# Patient Record
Sex: Male | Born: 1935 | Race: Black or African American | Hispanic: No | Marital: Married | Smoking: Former smoker
Health system: Southern US, Community
[De-identification: ages and names within clinical notes are randomized; demographics above are authoritative.]

## PROBLEM LIST (undated history)

## (undated) DIAGNOSIS — F0390 Unspecified dementia without behavioral disturbance: Secondary | ICD-10-CM

## (undated) DIAGNOSIS — E7800 Pure hypercholesterolemia, unspecified: Secondary | ICD-10-CM

## (undated) DIAGNOSIS — N529 Male erectile dysfunction, unspecified: Secondary | ICD-10-CM

## (undated) DIAGNOSIS — I10 Essential (primary) hypertension: Secondary | ICD-10-CM

## (undated) DIAGNOSIS — N400 Enlarged prostate without lower urinary tract symptoms: Secondary | ICD-10-CM

## (undated) DIAGNOSIS — Z923 Personal history of irradiation: Secondary | ICD-10-CM

## (undated) DIAGNOSIS — C61 Malignant neoplasm of prostate: Secondary | ICD-10-CM

## (undated) DIAGNOSIS — F03A0 Unspecified dementia, mild, without behavioral disturbance, psychotic disturbance, mood disturbance, and anxiety: Secondary | ICD-10-CM

## (undated) HISTORY — DX: Unspecified dementia, mild, without behavioral disturbance, psychotic disturbance, mood disturbance, and anxiety: F03.A0

## (undated) HISTORY — DX: Male erectile dysfunction, unspecified: N52.9

## (undated) HISTORY — DX: Personal history of irradiation: Z92.3

## (undated) HISTORY — DX: Pure hypercholesterolemia, unspecified: E78.00

## (undated) HISTORY — PX: FRACTURE SURGERY: SHX138

## (undated) HISTORY — PX: FEMUR FRACTURE SURGERY: SHX633

## (undated) HISTORY — DX: Unspecified dementia without behavioral disturbance: F03.90

## (undated) HISTORY — DX: Essential (primary) hypertension: I10

## (undated) HISTORY — DX: Benign prostatic hyperplasia without lower urinary tract symptoms: N40.0

---

## 2003-01-01 ENCOUNTER — Encounter: Admission: RE | Admit: 2003-01-01 | Discharge: 2003-01-01 | Payer: Self-pay | Admitting: Family Medicine

## 2003-01-01 ENCOUNTER — Encounter: Payer: Self-pay | Admitting: Family Medicine

## 2003-01-21 ENCOUNTER — Ambulatory Visit (HOSPITAL_COMMUNITY): Admission: RE | Admit: 2003-01-21 | Discharge: 2003-01-21 | Payer: Self-pay | Admitting: Orthopedic Surgery

## 2003-01-21 ENCOUNTER — Encounter: Payer: Self-pay | Admitting: Orthopedic Surgery

## 2005-05-23 ENCOUNTER — Inpatient Hospital Stay (HOSPITAL_COMMUNITY): Admission: EM | Admit: 2005-05-23 | Discharge: 2005-05-26 | Payer: Self-pay | Admitting: Emergency Medicine

## 2008-12-22 ENCOUNTER — Encounter: Admission: RE | Admit: 2008-12-22 | Discharge: 2008-12-22 | Payer: Self-pay | Admitting: Orthopedic Surgery

## 2009-02-07 ENCOUNTER — Encounter: Admission: RE | Admit: 2009-02-07 | Discharge: 2009-02-07 | Payer: Self-pay | Admitting: Orthopedic Surgery

## 2009-03-29 ENCOUNTER — Ambulatory Visit (HOSPITAL_COMMUNITY): Admission: RE | Admit: 2009-03-29 | Discharge: 2009-03-30 | Payer: Self-pay | Admitting: Orthopedic Surgery

## 2009-04-09 HISTORY — PX: ROTATOR CUFF REPAIR: SHX139

## 2009-05-02 ENCOUNTER — Encounter: Admission: RE | Admit: 2009-05-02 | Discharge: 2009-07-27 | Payer: Self-pay | Admitting: Orthopedic Surgery

## 2009-10-07 ENCOUNTER — Encounter: Admission: RE | Admit: 2009-10-07 | Discharge: 2009-10-07 | Payer: Self-pay | Admitting: Orthopedic Surgery

## 2009-10-12 ENCOUNTER — Encounter: Admission: RE | Admit: 2009-10-12 | Discharge: 2010-01-02 | Payer: Self-pay | Admitting: Orthopedic Surgery

## 2010-04-30 ENCOUNTER — Encounter: Payer: Self-pay | Admitting: Gastroenterology

## 2010-04-30 ENCOUNTER — Encounter: Payer: Self-pay | Admitting: Orthopedic Surgery

## 2010-07-03 ENCOUNTER — Emergency Department (HOSPITAL_COMMUNITY)
Admission: EM | Admit: 2010-07-03 | Discharge: 2010-07-03 | Disposition: A | Discharge status: Home / Self Care | Payer: MEDICARE | Attending: Emergency Medicine | Admitting: Emergency Medicine

## 2010-07-03 DIAGNOSIS — I10 Essential (primary) hypertension: Secondary | ICD-10-CM | POA: Insufficient documentation

## 2010-07-03 DIAGNOSIS — R112 Nausea with vomiting, unspecified: Secondary | ICD-10-CM | POA: Insufficient documentation

## 2010-07-03 DIAGNOSIS — R197 Diarrhea, unspecified: Secondary | ICD-10-CM | POA: Insufficient documentation

## 2010-07-03 DIAGNOSIS — E119 Type 2 diabetes mellitus without complications: Secondary | ICD-10-CM | POA: Insufficient documentation

## 2010-07-03 LAB — POCT I-STAT, CHEM 8
BUN: 20 mg/dL (ref 6–23)
Calcium, Ion: 1.02 mmol/L — ABNORMAL LOW (ref 1.12–1.32)
Chloride: 107 mEq/L (ref 96–112)
Glucose, Bld: 274 mg/dL — ABNORMAL HIGH (ref 70–99)
TCO2: 21 mmol/L (ref 0–100)

## 2010-07-10 LAB — URINALYSIS, ROUTINE W REFLEX MICROSCOPIC
Bilirubin Urine: NEGATIVE
Glucose, UA: NEGATIVE mg/dL
Hgb urine dipstick: NEGATIVE
Ketones, ur: NEGATIVE mg/dL
Protein, ur: NEGATIVE mg/dL
pH: 6.5 (ref 5.0–8.0)

## 2010-07-10 LAB — CBC
HCT: 36.3 % — ABNORMAL LOW (ref 39.0–52.0)
Hemoglobin: 12.3 g/dL — ABNORMAL LOW (ref 13.0–17.0)
MCHC: 33.8 g/dL (ref 30.0–36.0)
MCV: 91.7 fL (ref 78.0–100.0)
Platelets: 233 10*3/uL (ref 150–400)
RDW: 14.6 % (ref 11.5–15.5)

## 2010-07-10 LAB — GLUCOSE, CAPILLARY
Glucose-Capillary: 130 mg/dL — ABNORMAL HIGH (ref 70–99)
Glucose-Capillary: 163 mg/dL — ABNORMAL HIGH (ref 70–99)

## 2010-07-10 LAB — COMPREHENSIVE METABOLIC PANEL
Albumin: 4.1 g/dL (ref 3.5–5.2)
BUN: 10 mg/dL (ref 6–23)
Calcium: 9 mg/dL (ref 8.4–10.5)
Chloride: 104 mEq/L (ref 96–112)
Creatinine, Ser: 0.98 mg/dL (ref 0.4–1.5)
Total Bilirubin: 0.8 mg/dL (ref 0.3–1.2)
Total Protein: 7.7 g/dL (ref 6.0–8.3)

## 2010-07-29 ENCOUNTER — Emergency Department (HOSPITAL_COMMUNITY)
Admission: EM | Admit: 2010-07-29 | Discharge: 2010-07-29 | Disposition: A | Discharge status: Home / Self Care | Payer: Medicare Other | Attending: Emergency Medicine | Admitting: Emergency Medicine

## 2010-07-29 ENCOUNTER — Emergency Department (HOSPITAL_COMMUNITY): Payer: Medicare Other

## 2010-07-29 DIAGNOSIS — IMO0002 Reserved for concepts with insufficient information to code with codable children: Secondary | ICD-10-CM | POA: Insufficient documentation

## 2010-07-29 DIAGNOSIS — Z87828 Personal history of other (healed) physical injury and trauma: Secondary | ICD-10-CM | POA: Insufficient documentation

## 2010-07-29 DIAGNOSIS — I10 Essential (primary) hypertension: Secondary | ICD-10-CM | POA: Insufficient documentation

## 2010-07-29 DIAGNOSIS — S61409A Unspecified open wound of unspecified hand, initial encounter: Secondary | ICD-10-CM | POA: Insufficient documentation

## 2010-07-29 DIAGNOSIS — E119 Type 2 diabetes mellitus without complications: Secondary | ICD-10-CM | POA: Insufficient documentation

## 2010-07-29 DIAGNOSIS — M21939 Unspecified acquired deformity of unspecified forearm: Secondary | ICD-10-CM | POA: Insufficient documentation

## 2010-07-29 DIAGNOSIS — Y92009 Unspecified place in unspecified non-institutional (private) residence as the place of occurrence of the external cause: Secondary | ICD-10-CM | POA: Insufficient documentation

## 2010-07-29 DIAGNOSIS — W11XXXA Fall on and from ladder, initial encounter: Secondary | ICD-10-CM | POA: Insufficient documentation

## 2010-08-05 ENCOUNTER — Emergency Department (HOSPITAL_COMMUNITY)
Admission: EM | Admit: 2010-08-05 | Discharge: 2010-08-05 | Disposition: A | Discharge status: Home / Self Care | Payer: Medicare Other | Attending: Emergency Medicine | Admitting: Emergency Medicine

## 2010-08-05 DIAGNOSIS — E119 Type 2 diabetes mellitus without complications: Secondary | ICD-10-CM | POA: Insufficient documentation

## 2010-08-05 DIAGNOSIS — S61409A Unspecified open wound of unspecified hand, initial encounter: Secondary | ICD-10-CM | POA: Insufficient documentation

## 2010-08-05 DIAGNOSIS — Z09 Encounter for follow-up examination after completed treatment for conditions other than malignant neoplasm: Secondary | ICD-10-CM | POA: Insufficient documentation

## 2010-08-05 DIAGNOSIS — X58XXXA Exposure to other specified factors, initial encounter: Secondary | ICD-10-CM | POA: Insufficient documentation

## 2010-08-12 ENCOUNTER — Emergency Department (HOSPITAL_COMMUNITY)
Admission: EM | Admit: 2010-08-12 | Discharge: 2010-08-12 | Disposition: A | Discharge status: Home / Self Care | Payer: Medicare Other | Attending: Emergency Medicine | Admitting: Emergency Medicine

## 2010-08-12 DIAGNOSIS — Z4802 Encounter for removal of sutures: Secondary | ICD-10-CM | POA: Insufficient documentation

## 2010-08-25 NOTE — H&P (Signed)
NAMEJOHARI, Craig Henderson NO.:  [id number]   MEDICAL RECORD NO.:  [id number]          PATIENT TYPE:  EMS   LOCATION:  ED                           FACILITY:  [facility]   PHYSICIAN:  Elliot Cousin, M.D.    DATE OF BIRTH:  1929-12-21   DATE OF ADMISSION:  05/22/2005  DATE OF DISCHARGE:                                HISTORY & PHYSICAL   CHIEF COMPLAINT:  Productive cough, shortness of breath, vomiting,  generalized weakness.   HISTORY OF PRESENT ILLNESS:  The patient is a 77 year old man with a past  medical history significant for type 2 diabetes mellitus, hypertension, and  hyperlipidemia, who presents to the emergency department with a three-day  history of generalized weakness, productive cough with shortness of breath,  subjective fever and chills, and poor appetite.  His symptoms started three  to four days ago with generalized nausea and vomiting after being exposed to  a co-worker with a viral gastroenteritis.  The nausea and vomiting have  subsided.  His cough has continued.  It is productive with green sputum.  He  denies chest pain or pleurisy.  He does not smoke.  He has no prior history  of pneumonia.   During the evaluation in the emergency department, the patient is noted to  be febrile with a temperature of 102.9, tachycardic with a pulse rate of 125  and hypoxic with an oxygen saturation on room air of 83%.  His chest x-ray  reveals bibasilar pneumonia.  The patient will, therefore, be admitted for  further evaluation and management.   PAST MEDICAL HISTORY:  1.  Type 2 diabetes mellitus, diagnosed approximately 20 years ago.  The      patient gives no history of diabetic retinopathy, neuropathy, or      nephropathy.  2.  Hypertension.  3.  History of bilateral lower extremity fractures secondary to a fall,      status post surgical repair in 1974.  4.  Hyperlipidemia.   MEDICATIONS:  1.  Lotrel 5/20 mg one tablet daily.  2.  Pravachol 20 mg  daily.  3.  Glucovance question dose b.i.d.  4.  Byetta 10 mg subcutaneously b.i.d.   ALLERGIES:  No known drug allergies.   SOCIAL HISTORY:  The [place] is [place]  He has five children.  He is retired from his  primary occupation.  However, he is now self-employed.  He recycles  computers.  He denies tobacco and illicit drug use.   FAMILY HISTORY:  His mother died of old age at the age of 23 years old. His  father died in his 24's secondary to some type of poisoning.   REVIEW OF SYSTEMS:  The patient's review of systems is positive for  occasional arthritic pain; otherwise his review of systems is negative.   PHYSICAL EXAMINATION:  VITAL SIGNS:  Temperature 102.9, repeated at 100.3.  Pulse 125, repeated at 96.  Oxygen saturation 83% on room air, repeated at  98%  on 4 L.  Respiratory rate 20.  GENERAL:  The patient is  a pleasant, 77 year old [place] man who is  currently lying in bed in no acute distress.  However, he does appear ill.  HEENT:  Head is normocephalic, atraumatic.  Pupils are equal, round and  reactive to light.  Extraocular movements  were intact.  Conjunctivae clear.  Sclerae white.  Tympanic membranes are clear bilaterally.  Nasal mucosa is  dry.  No sinus tenderness.  Oropharynx reveals fairly good dentition.  Mucous membranes are dry.  No posterior exudates or erythema.  NECK:  Supple.  No adenopathy.  No thyromegaly.  No bruits.  No JVD.  LUNGS:  Breathing is nonlabored.  He has bilateral crackles in his lower  lobes.  HEART:  S1 and S2 with mild tachycardia.  No murmurs, rubs or gallops.  ABDOMEN:  Hypoactive bowel sounds.  Abdomen is slightly obese, soft,  nontender, nondistended.  No hepatosplenomegaly.  No masses palpated.  EXTREMITIES:  The patient's pedal pulses are 2+ bilaterally.  No pretibial  edema.  No pedal edema.  No open plantar ulcers.  The patient has an  excellent range of motion  of all of his extremities.  No focal or acute  joint findings.  NEUROLOGIC:  The patient is alert and oriented x3.  Cranial nerves II-XII  intact.  Strength is 5/5 in the supine position.  Sensation is intact.   ADMISSION LABORATORY DATA:  Chest x-ray reveals bilateral lower lobe  pneumonia, right greater than left.  ABG on 4 L of nasal cannula oxygen  revealed a PH of 7.4, PCO2 36, PO2 of 68.  Sodium 137, potassium 3.4,  chloride 106, CO2 24, glucose 244, BUN 16, creatinine 1.3, calcium 8.5,  total protein 7.6, albumin 3.8, AST 21, ALT 22, alk phos 71, total bilirubin  1.  WBC 16.9.  Absolute neutrophil count 16.1.  Hemoglobin 12.1, hematocrit  35.2, platelets 187.   ASSESSMENT:  1.  Bilateral lower lobe pneumonia with hypoxia, fever, and leukocytosis.  2.  Tachycardia, probably secondary to volume depletion and pneumonia.  3.  Hypokalemia.  4.  Normocytic anemia.  5.  Type 2 diabetes mellitus.  6.  Hypertension.   PLAN:  1.  Blood cultures were ordered by the emergency department physician.  The      patient was started on antibiotic therapy with Rocephin and azithromycin      per Dr. Adriana Simas.  2.  The patient will be admitted for further evaluation and management.      Antibiotic therapy will continue with Rocephin and azithromycin      intravenously.  3.  Nasal cannula oxygen titrated to keep his oxygen saturations greater      than 90%.  4.  Symptomatic treatment with Xopenex nebulizers, Humibid LA, and Tussionex      5 mL q.12h. p.r.n. for cough.  5.  Tylenol as needed for fever.  6.  Will check a urine, legionella antigen, sputum for routine Gram stain      and culture and sensitivity.  7.  Will check a TSH, vitamin B12, folate, ferritin, and total iron for work-      up of anemia.  Guaiac stools x3.  8.  Volume repletion with normal saline.  9.  Continue Lotrel for hypertension. 10. Will hold Glucovance until the patient's intake is adequate.  Will also      hold Byetta.   Will treat his diabetes      during the hospitalization with a sliding scale insulin regimen and  Lantus.  Doubt that the patient will need to go home on insulin therapy.      Will check a hemoglobin A1C, however.  11. Replete potassium chloride and check a magnesium level to rule out      deficiency.      Elliot Cousin, M.D.  Electronically Signed     DF/MEDQ  D:  05/23/2005  T:  05/23/2005  Job:  [id number]

## 2010-08-25 NOTE — Discharge Summary (Signed)
Craig Henderson, Craig Henderson NO.:  [id number]   MEDICAL RECORD NO.:  [id number]          PATIENT TYPE:  INP   LOCATION:  1406                         FACILITY:  [facility]   PHYSICIAN:  Craig Henderson, D.O.    DATE OF BIRTH:  Nov 06, 1929   DATE OF ADMISSION:  05/22/2005  DATE OF DISCHARGE:  05/26/2005                                 DISCHARGE SUMMARY   REASON FOR ADMISSION:  Productive cough, worsening shortness of breath,  radiographic evidence of bilateral lower lobe pneumonia with hypoxia.   INITIAL DIAGNOSES:  1.  Type 2 diabetes with evidence of good control in the outpatient setting      on Byetta. His hemoglobin A1c this admission was at 5.3. His blood      glucoses were a little bit less well controlled in the inpatient setting      due to his acute illness. In addition, his regimen of Byetta was not      available in the inpatient setting.  2.  Iron-deficiency anemia with iron saturation less than 10%. The patient      reports he had a colonoscopy about four years ago. Would recommend      further evaluation of this in the outpatient setting by his primary care      physician. He is being discharged on supplemental iron sulfate, and he      was Hemoccult negative during his hospital course.  3.  Hyperlipidemia and hypertension, controlled during this hospitalization.  4.  History of bilateral lower extremity fracture secondary to fall status      post surgical repair.   DISPOSITION:  The patient is being discharged home to follow with his  primary care physician. We have established a followup appointment with Dr.  Renaye Rakers on Monday at 4:45. He is in improved and medically stable  condition.   MEDICATIONS:  1.  Avelox 400 mg daily until June 05, 2005.  2.  Iron sulfate 325 mg p.o. b.i.d.  3.  He should continue his home regimen of metformin of 5/500 b.i.d.  4.  Pravachol 80 mg daily.  5.  1.  Byetta 10 mg twice daily as before.   HISTORY OF PRESENT  ILLNESS:  For full details, please refer to the H&P as  dictated by Dr. Elliot Cousin. Briefly, Craig Henderson is a pleasant 77-year-  old male with a past medical history significant for type 2 diabetes,  hypertension, hyperlipidemia who presents to the emergency department with a  three-day history of generalized weakness, productive cough, shortness of  breath, subjective fevers and poor appetite. His symptoms started three to  four days previously with generalized nausea and vomiting after being  exposed to a coworker with gastroenteritis. He stated that the nausea and  vomiting had subsided though he continued to have a cough with colored  sputum. In the emergency department, he is noted to be febrile, temperature  102.9, tachycardic with a heart rate of 125. He was hypoxic on room with O2  saturation of 83%. He was admitted for treatment of pneumonia.   HOSPITAL COURSE:  The patient  was initially admitted. He underwent blood  cultures that were negative. Legionella urinary antigen was as well  negative. He underwent empiric treatment with IV antibiotics, and his course  was that of slow but gradual improvement. He continued with fevers until 24  hours prior to discharge. His oxygen saturations improved, and he did  clinically look much better and was felt to be stable for discharge. Routine  studies  did reveal iron-deficiency anemia, and further workup is being deferred to  his primary care physician until resolution of his acute illness.   LABORATORY DATA:  On day of discharge, sodium 139, potassium 3.7, BUN 13,  creatinine 1.2, glucose 159.      Craig Henderson, D.O.  Electronically Signed     ESS/MEDQ  D:  05/26/2005  T:  05/26/2005  Job:  [id number]   cc:   Renaye Rakers, M.D.  Fax: [phone]

## 2010-10-05 ENCOUNTER — Encounter: Payer: Self-pay | Admitting: Podiatry

## 2010-10-09 ENCOUNTER — Ambulatory Visit
Admission: RE | Admit: 2010-10-09 | Discharge: 2010-10-09 | Disposition: A | Discharge status: Home / Self Care | Payer: Medicare Other | Source: Ambulatory Visit | Attending: Orthopedic Surgery | Admitting: Orthopedic Surgery

## 2010-10-09 ENCOUNTER — Other Ambulatory Visit: Payer: Self-pay | Admitting: Orthopedic Surgery

## 2010-10-09 DIAGNOSIS — M1990 Unspecified osteoarthritis, unspecified site: Secondary | ICD-10-CM

## 2011-07-18 DIAGNOSIS — C61 Malignant neoplasm of prostate: Secondary | ICD-10-CM

## 2011-07-18 HISTORY — DX: Malignant neoplasm of prostate: C61

## 2011-07-18 HISTORY — PX: PROSTATE BIOPSY: SHX241

## 2011-07-27 ENCOUNTER — Other Ambulatory Visit (HOSPITAL_COMMUNITY): Payer: Self-pay | Admitting: Urology

## 2011-07-27 DIAGNOSIS — C61 Malignant neoplasm of prostate: Secondary | ICD-10-CM

## 2011-08-01 ENCOUNTER — Encounter (HOSPITAL_COMMUNITY)
Admission: RE | Admit: 2011-08-01 | Discharge: 2011-08-01 | Disposition: A | Discharge status: Home / Self Care | Payer: Medicare Other | Source: Ambulatory Visit | Attending: Urology | Admitting: Urology

## 2011-08-01 ENCOUNTER — Encounter (HOSPITAL_COMMUNITY): Payer: Self-pay

## 2011-08-01 DIAGNOSIS — C61 Malignant neoplasm of prostate: Secondary | ICD-10-CM

## 2011-08-01 HISTORY — DX: Malignant neoplasm of prostate: C61

## 2011-08-01 MED ORDER — TECHNETIUM TC 99M MEDRONATE IV KIT
25.0000 | PACK | Freq: Once | INTRAVENOUS | Status: AC | PRN
  Administered 2011-08-01: 25 via INTRAVENOUS

## 2011-09-04 ENCOUNTER — Encounter: Payer: Self-pay | Admitting: *Deleted

## 2011-09-05 ENCOUNTER — Ambulatory Visit
Admission: RE | Admit: 2011-09-05 | Discharge: 2011-09-05 | Disposition: A | Discharge status: Home / Self Care | Payer: Medicare Other | Source: Ambulatory Visit | Attending: Radiation Oncology | Admitting: Radiation Oncology

## 2011-09-05 ENCOUNTER — Encounter: Payer: Self-pay | Admitting: Radiation Oncology

## 2011-09-05 VITALS — BP 131/69 | HR 71 | Temp 98.6°F | Wt 188.6 lb

## 2011-09-05 DIAGNOSIS — C61 Malignant neoplasm of prostate: Secondary | ICD-10-CM | POA: Insufficient documentation

## 2011-09-05 DIAGNOSIS — Z79899 Other long term (current) drug therapy: Secondary | ICD-10-CM | POA: Insufficient documentation

## 2011-09-05 DIAGNOSIS — E7800 Pure hypercholesterolemia, unspecified: Secondary | ICD-10-CM | POA: Insufficient documentation

## 2011-09-05 DIAGNOSIS — Z510 Encounter for antineoplastic radiation therapy: Secondary | ICD-10-CM | POA: Insufficient documentation

## 2011-09-05 DIAGNOSIS — E119 Type 2 diabetes mellitus without complications: Secondary | ICD-10-CM | POA: Insufficient documentation

## 2011-09-05 DIAGNOSIS — I10 Essential (primary) hypertension: Secondary | ICD-10-CM | POA: Insufficient documentation

## 2011-09-05 NOTE — Progress Notes (Signed)
Radiation Oncology         [phone] ________________________________  Initial outpatient Consultation  Name: Craig Henderson MRN: [id number]  Date: 09/05/2011  DOB: 1929-12-10  WU:JWJXB,JYNWG J, MD, MD  Lindaann Slough, MD   REFERRING PHYSICIAN: Lindaann Slough, MD  DIAGNOSIS: 78 year old gentleman with stage T1c adenocarcinoma prostate Gleason score 4+5 and a PSA of 12.45  HISTORY OF PRESENT ILLNESS::Craig Henderson is a 78 year old gentleman. He was noted to have an elevated PSA of 12.3 by his primary care physician, Dr. Parke Simmers in January 2013 .  Accordingly, he was referred for evaluation in urology by Dr. [name] on 05/22/2011,  digital rectal examination was performed at that time revealing a 3+ prostate with no nodules. The patient received Cipro for 3 weeks with repeat PSA on March 14. His PSA at that time remained elevated at 12.45.Marland Kitchen  The patient proceeded to transrectal ultrasound with 12 biopsies of the prostate on 07/18/2011.  The prostate volume measured 156.35  cc.  Out of 12 core biopsies,10  were positive.  The maximum Gleason score was 4+5, and this was seen in to core biopsies from the right base and right lateral base zones. Gleason's 4+4 was seen in the right mid, and right apex specimens. Other cores contained Gleason 6 and 7 disease..  The patient reviewed the biopsy results with his urologist and he has kindly been referred today for discussion of potential radiation treatment options.  PREVIOUS RADIATION THERAPY: No  PAST MEDICAL HISTORY:  has a past medical history of Prostate cancer (07/18/11); Diabetes mellitus; Hypertension; Hypercholesterolemia; BPH (benign prostatic hyperplasia); and ED (erectile dysfunction).    PAST SURGICAL HISTORY: Past Surgical History  Procedure Date  . Prostate biopsy 07/18/2011    gleason 4+5=9  . Rotator cuff repair 2011  . Fracture surgery 35 years    right leg/rod placement    FAMILY HISTORY: family history is not on  file.  SOCIAL HISTORY:  reports that he quit smoking about 39 years ago. His smoking use included Cigarettes. He has a 10 pack-year smoking history. He has never used smokeless tobacco. He reports that he drinks alcohol.  ALLERGIES: Review of patient's allergies indicates no known allergies.  MEDICATIONS:  Current Outpatient Prescriptions  Medication Sig Dispense Refill  . AMLODIPINE BESYLATE PO Take by mouth.      . metFORMIN (GLUCOPHAGE) 500 MG tablet Take 500 mg by mouth daily with breakfast.      . Multiple Vitamin (MULTIVITAMIN) capsule Take 1 capsule by mouth daily.      . multivitamin-lutein (OCUVITE-LUTEIN) CAPS Take 1 capsule by mouth daily.        REVIEW OF SYSTEMS:  A 15 point review of systems is documented in the electronic medical record. This was obtained by the nursing staff. However, I reviewed this with the patient to discuss relevant findings and make appropriate changes.  A comprehensive review of systems was negative.   PHYSICAL EXAM:  weight is 188 lb 9.6 oz (85.548 kg). His temperature is 98.6 F (37 C). His blood pressure is 131/69 and his pulse is 71.   The patient was in no acute stress today. Is alert and oriented. Respiratory effort is unremarkable. Speech is fluent articulate. Motor strength is intact throughout. Detailed physical exam is deferred today. Please note the rectal exam findings described above.  LABORATORY DATA:  Lab Results  Component Value Date   WBC 10.4 03/28/2009   HGB 13.3 07/03/2010   HCT 39.0 07/03/2010   MCV 91.7  03/28/2009   PLT 233 03/28/2009   Lab Results  Component Value Date   NA 142 07/03/2010   K 4.0 07/03/2010   CL 107 07/03/2010   CO2 25 03/28/2009   Lab Results  Component Value Date   ALT 15 03/28/2009   AST 15 03/28/2009   ALKPHOS 119* 03/28/2009   BILITOT 0.8 03/28/2009     RADIOGRAPHY:  bone scan was performed showing no metastatic disease.    IMPRESSION: Craig Henderson is a very nice 78 year old gentleman with  stage TI C. adenocarcinoma prostate Gleason score of 4+5 and a PSA of 12.45. He falls into a high risk group based on his Gleason score and would be eligible for a variety of potential options. However, his elevated prostate volume at 156 cc potentially rules out any role for prostate brachytherapy. As such, patient would potentially be eligible for external radiation treatment, likely in conjunction with androgen deprivation given his high-risk disease.  PLAN: Today I reviewed the findings and workup thus far.  We discussed the natural history of prostate cancer.  We reviewed the the implications of T-stage, Gleason's Score, and PSA on decision-making and outcomes in prostate cancer.  We discussed radiation treatment in the management of prostate cancer with regard to the logistics and delivery of external beam radiation treatment as well as the logistics and delivery of prostate brachytherapy.  We compared and contrasted each of these approaches and also compared these against prostatectomy.  The patient expressed interest in external beam radiotherapy.  I filled out a patient counseling form for him with relevant treatment diagrams and we retained a copy for our records.   The patient would like to proceed with prostate IMRT.  I will share my findings with Dr. [name] and move forward with scheduling placement of three gold fiducial markers into the prostate to proceed with IMRT in the near future.     In conjunction with IMRT, patient would potentially benefit from androgen deprivation for 2-3 years we talked briefly about androgen deprivation today including the potential risks associated with this treatment as well as the potential benefit. We talked with the scheduling of radiation treatment as well as the scheduling of androgen deprivation. The patient would like to undergo androgen deprivation in conjunction with radiotherapy.  I enjoyed meeting with him today, and will look forward to participating in  the care of this very nice gentleman.   I spent 60 minutes minutes face to face with the patient and more than 50% of that time was spent in counseling and/or coordination of care.   ------------------------------------------------  Artist Pais. Kathrynn Running, M.D.

## 2011-09-05 NOTE — Progress Notes (Signed)
Patient here for radiation consultation for newly diagnosed prostate cancer.Gleason 4+5=9.Leaning towards external beam radiation only.

## 2011-09-05 NOTE — Progress Notes (Signed)
Please see the Nurse Progress Note in the MD Initial Consult Encounter for this patient. 

## 2011-09-06 ENCOUNTER — Telehealth: Payer: Self-pay | Admitting: *Deleted

## 2011-09-06 NOTE — Progress Notes (Signed)
Encounter addended by: Tessa Lerner, RN on: 09/06/2011  4:21 PM<BR>     Documentation filed: Charges VN

## 2011-09-06 NOTE — Telephone Encounter (Signed)
CALLED PATIENT TO INFORM OF APPT. FOR HORMONE SHOT FOR 09-07-11 AT 9:00 AM AT DR. [name]' OFFICE AND HIS GOLD SEED PLACEMENT ON 10-10-11 AT 2:30 PM AT DR. [name]' OFFICE AND HIS SIM ON 10-12-11 AT 11:00 AM AT DR. MANNING'S OFFICE, LVM FOR A RETURN CALL

## 2011-10-10 ENCOUNTER — Telehealth: Payer: Self-pay | Admitting: *Deleted

## 2011-10-10 NOTE — Telephone Encounter (Signed)
CALLED PATIENT TO REMIND OF APPT. FOR 10-12-11 AT 11:00 AM, CONFIRMED APPT. WITH PATIENT.

## 2011-10-10 NOTE — Telephone Encounter (Signed)
XXXX 

## 2011-10-12 ENCOUNTER — Ambulatory Visit
Admission: RE | Admit: 2011-10-12 | Discharge: 2011-10-12 | Disposition: A | Discharge status: Home / Self Care | Payer: Medicare Other | Source: Ambulatory Visit | Attending: Radiation Oncology | Admitting: Radiation Oncology

## 2011-10-12 ENCOUNTER — Encounter: Payer: Self-pay | Admitting: Radiation Oncology

## 2011-10-12 DIAGNOSIS — C61 Malignant neoplasm of prostate: Secondary | ICD-10-CM

## 2011-10-12 NOTE — Progress Notes (Signed)
Met with patient to discuss RO billing.  Dx: 185 Malignant neoplasm of prostate  Attending Rad: Dr. Kathrynn Running  Rad Tx: IMRT x 40

## 2011-10-12 NOTE — Progress Notes (Signed)
  Radiation Oncology         [phone] ________________________________  Name: Craig Henderson MRN: [id number]  Date: 10/12/2011  DOB: 1930/03/11  SIMULATION AND TREATMENT PLANNING NOTE  DIAGNOSIS:  78 year old gentleman with stage T1c adenocarcinoma prostate Gleason score 4+5 and a PSA of 12.45  NARRATIVE:  The patient was brought to the CT Simulation planning suite.  Identity was confirmed.  All relevant records and images related to the planned course of therapy were reviewed.  The patient freely provided informed written consent to proceed with treatment after reviewing the details related to the planned course of therapy. The consent form was witnessed and verified by the simulation staff.  Then, the patient was set-up in a stable reproducible  supine position for radiation therapy.  CT images were obtained.  Surface markings were placed.  The CT images were loaded into the planning software.  Then the target and avoidance structures were contoured.  Treatment planning then occurred.  The radiation prescription was entered and confirmed.  A total of 1 complex treatment device was fabricated this was an alpha cradle immobilization device. . I have requested : Intensity Modulated Radiotherapy (IMRT) is medically necessary for this case for the following reason:  Rectal sparing.  PLAN:  The patient will receive 75 Gy in 40 fractions with a field reduction after 25 treatments to initially treat the prostate seminal vesicles and pelvic lymph nodes followed by a boost to the prostate only. .  ________________________________  Artist Pais Kathrynn Running, M.D.

## 2011-10-15 ENCOUNTER — Encounter: Payer: Self-pay | Admitting: *Deleted

## 2011-10-23 ENCOUNTER — Ambulatory Visit
Admission: RE | Admit: 2011-10-23 | Discharge: 2011-10-23 | Disposition: A | Discharge status: Home / Self Care | Payer: Medicare Other | Source: Ambulatory Visit | Attending: Radiation Oncology | Admitting: Radiation Oncology

## 2011-10-23 DIAGNOSIS — C61 Malignant neoplasm of prostate: Secondary | ICD-10-CM

## 2011-10-23 NOTE — Progress Notes (Signed)
Routine of clinic reviewed.Knows he will generally see doctor of Friday.Reviewed possible  side effects of pelvic radiation consistent with urinary symptoms of frequency and urgency as well as bowel changes and fatigue.Patient has poor memory recall so will need lots of reinforcement.

## 2011-10-24 ENCOUNTER — Ambulatory Visit
Admission: RE | Admit: 2011-10-24 | Discharge: 2011-10-24 | Disposition: A | Discharge status: Home / Self Care | Payer: Medicare Other | Source: Ambulatory Visit | Attending: Radiation Oncology | Admitting: Radiation Oncology

## 2011-10-25 ENCOUNTER — Ambulatory Visit
Admission: RE | Admit: 2011-10-25 | Discharge: 2011-10-25 | Disposition: A | Discharge status: Home / Self Care | Payer: Medicare Other | Source: Ambulatory Visit | Attending: Radiation Oncology | Admitting: Radiation Oncology

## 2011-10-25 ENCOUNTER — Encounter: Payer: Self-pay | Admitting: Radiation Oncology

## 2011-10-25 VITALS — BP 133/67 | HR 72 | Temp 97.1°F | Wt 185.0 lb

## 2011-10-25 DIAGNOSIS — C61 Malignant neoplasm of prostate: Secondary | ICD-10-CM

## 2011-10-25 NOTE — Progress Notes (Signed)
Here for routine weekly md visit for pelvic radiation for prostate cancer.Completed 3 of 40 treatments. No urinary or bowel changes.Denies pain.

## 2011-10-25 NOTE — Progress Notes (Signed)
  Radiation Oncology         [phone] ________________________________  Name: Craig Henderson MRN: [id number]  Date: 10/25/2011  DOB: Aug 09, 1929  Weekly Radiation Therapy Management  Current Dose: 5.4 Gy     Planned Dose:  75 Gy  Narrative . . . . . . . . The patient presents for routine under treatment assessment.                                    The patient is without complaint.                                 Set-up films were reviewed.                                 The chart was checked. Physical Findings. . . Weight essentially stable.  No significant changes. Impression . . . . . . . The patient is  tolerating radiation. Plan . . . . . . . . . . . . Continue treatment as planned.  ________________________________  Artist Pais. Kathrynn Running, M.D.

## 2011-10-26 ENCOUNTER — Ambulatory Visit
Admission: RE | Admit: 2011-10-26 | Discharge: 2011-10-26 | Disposition: A | Discharge status: Home / Self Care | Payer: Medicare Other | Source: Ambulatory Visit | Attending: Radiation Oncology | Admitting: Radiation Oncology

## 2011-10-29 ENCOUNTER — Ambulatory Visit
Admission: RE | Admit: 2011-10-29 | Discharge: 2011-10-29 | Disposition: A | Discharge status: Home / Self Care | Payer: Medicare Other | Source: Ambulatory Visit | Attending: Radiation Oncology | Admitting: Radiation Oncology

## 2011-10-30 ENCOUNTER — Ambulatory Visit
Admission: RE | Admit: 2011-10-30 | Discharge: 2011-10-30 | Disposition: A | Discharge status: Home / Self Care | Payer: Medicare Other | Source: Ambulatory Visit | Attending: Radiation Oncology | Admitting: Radiation Oncology

## 2011-10-31 ENCOUNTER — Ambulatory Visit
Admission: RE | Admit: 2011-10-31 | Discharge: 2011-10-31 | Disposition: A | Discharge status: Home / Self Care | Payer: Medicare Other | Source: Ambulatory Visit | Attending: Radiation Oncology | Admitting: Radiation Oncology

## 2011-11-01 ENCOUNTER — Ambulatory Visit
Admission: RE | Admit: 2011-11-01 | Discharge: 2011-11-01 | Disposition: A | Discharge status: Home / Self Care | Payer: Medicare Other | Source: Ambulatory Visit | Attending: Radiation Oncology | Admitting: Radiation Oncology

## 2011-11-02 ENCOUNTER — Encounter: Payer: Self-pay | Admitting: Radiation Oncology

## 2011-11-02 ENCOUNTER — Ambulatory Visit
Admission: RE | Admit: 2011-11-02 | Discharge: 2011-11-02 | Disposition: A | Discharge status: Home / Self Care | Payer: Medicare Other | Source: Ambulatory Visit | Attending: Radiation Oncology | Admitting: Radiation Oncology

## 2011-11-02 VITALS — BP 149/77 | HR 76 | Temp 98.6°F | Resp 20 | Wt 185.2 lb

## 2011-11-02 DIAGNOSIS — C61 Malignant neoplasm of prostate: Secondary | ICD-10-CM

## 2011-11-02 NOTE — Progress Notes (Signed)
  Radiation Oncology         [phone]) [phone] ________________________________  Name: Craig Henderson MRN: [id number]  Date: 11/02/2011  DOB: Sep 01, 1929  Weekly Radiation Therapy Management  Current Dose: 16.2 Gy     Planned Dose:  75 Gy  Narrative . . . . . . . . The patient presents for routine under treatment assessment.                                                             The patient is without complaint.  Noting diarrhea.                                 Set-up films were reviewed.                                 The chart was checked. Physical Findings. . . Weight essentially stable.  No significant changes. Impression . . . . . . . The patient is  tolerating radiation. Plan . . . . . . . . . . . . Continue treatment as planned.  Low residue diet.  ________________________________  Craig Henderson, M.D.

## 2011-11-02 NOTE — Progress Notes (Signed)
Patient arrived, slow steady gait,alert,oriented x3, very tired"i've had a rough week with loose stools Monday,tuesdy and Wednesday a7 Thursday, none today"asked about imodium, wriote name on low fiber sheet and handed to patient, no dysuria, but does feel like he has a slow stream and doesn't empty his bladder all the way, no c/o pain Had eggs and grits for breakfast, small glas of s oj and water,no lunch yet, completed 9txs so far1:15 PM

## 2011-11-05 ENCOUNTER — Ambulatory Visit
Admission: RE | Admit: 2011-11-05 | Discharge: 2011-11-05 | Disposition: A | Discharge status: Home / Self Care | Payer: Medicare Other | Source: Ambulatory Visit | Attending: Radiation Oncology | Admitting: Radiation Oncology

## 2011-11-06 ENCOUNTER — Ambulatory Visit
Admission: RE | Admit: 2011-11-06 | Discharge: 2011-11-06 | Disposition: A | Discharge status: Home / Self Care | Payer: Medicare Other | Source: Ambulatory Visit | Attending: Radiation Oncology | Admitting: Radiation Oncology

## 2011-11-07 ENCOUNTER — Ambulatory Visit
Admission: RE | Admit: 2011-11-07 | Discharge: 2011-11-07 | Disposition: A | Discharge status: Home / Self Care | Payer: Medicare Other | Source: Ambulatory Visit | Attending: Radiation Oncology | Admitting: Radiation Oncology

## 2011-11-08 ENCOUNTER — Ambulatory Visit
Admission: RE | Admit: 2011-11-08 | Discharge: 2011-11-08 | Disposition: A | Discharge status: Home / Self Care | Payer: Medicare Other | Source: Ambulatory Visit | Attending: Radiation Oncology | Admitting: Radiation Oncology

## 2011-11-08 ENCOUNTER — Encounter: Payer: Self-pay | Admitting: Radiation Oncology

## 2011-11-08 VITALS — BP 130/69 | HR 70 | Temp 98.6°F | Wt 183.6 lb

## 2011-11-08 DIAGNOSIS — C61 Malignant neoplasm of prostate: Secondary | ICD-10-CM

## 2011-11-08 MED ORDER — BIAFINE EX EMUL
CUTANEOUS | Status: DC | PRN
  Administered 2011-11-08: 1 via TOPICAL

## 2011-11-08 NOTE — Progress Notes (Signed)
Here for routine weekly md visit for pelvic radiation of prostate cncer.Completed 12 of 40 treatments. Occasional frequency and  burning on urination. Has some buttock discomfort after bowel movements. Generalized fatigue and arthritis pain.

## 2011-11-08 NOTE — Addendum Note (Signed)
Encounter addended by: Tessa Lerner, RN on: 11/08/2011  9:45 AM<BR>     Documentation filed: Inpatient MAR

## 2011-11-08 NOTE — Addendum Note (Signed)
Encounter addended by: Tessa Lerner, RN on: 11/08/2011  9:36 AM<BR>     Documentation filed: Orders

## 2011-11-08 NOTE — Progress Notes (Signed)
  Radiation Oncology         [phone] ________________________________  Name: Craig Henderson MRN: [id number]  Date: 11/08/2011  DOB: Dec 01, 1929  Weekly Radiation Therapy Management  Current Dose: 21.6 Gy     Planned Dose:  75 Gy  Narrative . . . . . . . . The patient presents for routine under treatment assessment.                                                     The patient is without complaint.                                 Set-up films were reviewed.                                 The chart was checked. Physical Findings. . . Weight essentially stable.  No significant changes. Impression . . . . . . . The patient is  tolerating radiation. Plan . . . . . . . . . . . . Continue treatment as planned.  ________________________________  Artist Pais. Kathrynn Running, M.D.

## 2011-11-09 ENCOUNTER — Ambulatory Visit
Admission: RE | Admit: 2011-11-09 | Discharge: 2011-11-09 | Disposition: A | Discharge status: Home / Self Care | Payer: Medicare Other | Source: Ambulatory Visit | Attending: Radiation Oncology | Admitting: Radiation Oncology

## 2011-11-12 ENCOUNTER — Ambulatory Visit
Admission: RE | Admit: 2011-11-12 | Discharge: 2011-11-12 | Disposition: A | Discharge status: Home / Self Care | Payer: Medicare Other | Source: Ambulatory Visit | Attending: Radiation Oncology | Admitting: Radiation Oncology

## 2011-11-13 ENCOUNTER — Ambulatory Visit
Admission: RE | Admit: 2011-11-13 | Discharge: 2011-11-13 | Disposition: A | Discharge status: Home / Self Care | Payer: Medicare Other | Source: Ambulatory Visit | Attending: Radiation Oncology | Admitting: Radiation Oncology

## 2011-11-14 ENCOUNTER — Ambulatory Visit
Admission: RE | Admit: 2011-11-14 | Discharge: 2011-11-14 | Disposition: A | Discharge status: Home / Self Care | Payer: Medicare Other | Source: Ambulatory Visit | Attending: Radiation Oncology | Admitting: Radiation Oncology

## 2011-11-15 ENCOUNTER — Ambulatory Visit
Admission: RE | Admit: 2011-11-15 | Discharge: 2011-11-15 | Disposition: A | Discharge status: Home / Self Care | Payer: Medicare Other | Source: Ambulatory Visit | Attending: Radiation Oncology | Admitting: Radiation Oncology

## 2011-11-15 VITALS — BP 128/72 | HR 78 | Temp 98.1°F | Wt 185.6 lb

## 2011-11-15 DIAGNOSIS — C61 Malignant neoplasm of prostate: Secondary | ICD-10-CM

## 2011-11-15 NOTE — Progress Notes (Signed)
Here for routine weekly md visit for prostate cancer radiation.Completed 18 of 40 treatments. Has frequency of urination and nocuria 4 to 5 times.Main concern today is increased fatigue , increased cough and chest congestion.Vitals are stable oxygen sats 98%. Bowels fine.

## 2011-11-15 NOTE — Progress Notes (Signed)
   Department of Radiation Oncology  Phone:  [phone] Fax:        [phone]  Weekly Treatment Note    Name: Craig Henderson Date: 11/15/2011 MRN: [id number] DOB: [birth year]   Current dose: 32.4 Gy  Current fraction: 18   MEDICATIONS: Current Outpatient Prescriptions  Medication Sig Dispense Refill  . amLODipine-benazepril (LOTREL) 10-40 MG per capsule       . glyBURIDE-metformin (GLUCOVANCE) 5-500 MG per tablet       . metFORMIN (GLUCOPHAGE) 500 MG tablet Take 500 mg by mouth daily with breakfast.      . Multiple Vitamin (MULTIVITAMIN) capsule Take 1 capsule by mouth daily.      . multivitamin-lutein (OCUVITE-LUTEIN) CAPS Take 1 capsule by mouth daily.         ALLERGIES: Review of patient's allergies indicates no known allergies.   LABORATORY DATA:  Lab Results  Component Value Date   WBC 10.4 03/28/2009   HGB 13.3 07/03/2010   HCT 39.0 07/03/2010   MCV 91.7 03/28/2009   PLT 233 03/28/2009   Lab Results  Component Value Date   NA 142 07/03/2010   K 4.0 07/03/2010   CL 107 07/03/2010   CO2 25 03/28/2009   Lab Results  Component Value Date   ALT 15 03/28/2009   AST 15 03/28/2009   ALKPHOS 119* 03/28/2009   BILITOT 0.8 03/28/2009     NARRATIVE: Craig Henderson was seen today for weekly treatment management. The chart was checked and the patient's films were reviewed. The patient states that he is doing relatively well with his radiation treatment. No change in urination or bowel movements. His primary complaint today is that of some fatigue. He also feels that he has a cough and congestion with a history of pneumonia. He does have a primary care doctor who manages his general care.  PHYSICAL EXAMINATION: weight is 185 lb 9.6 oz (84.188 kg). His temperature is 98.1 F (36.7 C). His blood pressure is 128/72 and his pulse is 78.        ASSESSMENT: The patient is doing satisfactorily with treatment.  PLAN: We will continue with the patient's radiation  treatment as planned. He is doing well with treatment. He is to let us know if his cough becomes worse. No fever and he does know to contact his primary care physician as well.

## 2011-11-16 ENCOUNTER — Ambulatory Visit
Admission: RE | Admit: 2011-11-16 | Discharge: 2011-11-16 | Disposition: A | Discharge status: Home / Self Care | Payer: Medicare Other | Source: Ambulatory Visit | Attending: Radiation Oncology | Admitting: Radiation Oncology

## 2011-11-19 ENCOUNTER — Ambulatory Visit
Admission: RE | Admit: 2011-11-19 | Discharge: 2011-11-19 | Disposition: A | Discharge status: Home / Self Care | Payer: Medicare Other | Source: Ambulatory Visit | Attending: Radiation Oncology | Admitting: Radiation Oncology

## 2011-11-20 ENCOUNTER — Ambulatory Visit
Admission: RE | Admit: 2011-11-20 | Discharge: 2011-11-20 | Disposition: A | Discharge status: Home / Self Care | Payer: Medicare Other | Source: Ambulatory Visit | Attending: Radiation Oncology | Admitting: Radiation Oncology

## 2011-11-21 ENCOUNTER — Ambulatory Visit
Admission: RE | Admit: 2011-11-21 | Discharge: 2011-11-21 | Disposition: A | Discharge status: Home / Self Care | Payer: Medicare Other | Source: Ambulatory Visit | Attending: Radiation Oncology | Admitting: Radiation Oncology

## 2011-11-22 ENCOUNTER — Ambulatory Visit
Admission: RE | Admit: 2011-11-22 | Discharge: 2011-11-22 | Disposition: A | Discharge status: Home / Self Care | Payer: Medicare Other | Source: Ambulatory Visit | Attending: Radiation Oncology | Admitting: Radiation Oncology

## 2011-11-23 ENCOUNTER — Ambulatory Visit
Admission: RE | Admit: 2011-11-23 | Discharge: 2011-11-23 | Disposition: A | Discharge status: Home / Self Care | Payer: Medicare Other | Source: Ambulatory Visit | Attending: Radiation Oncology | Admitting: Radiation Oncology

## 2011-11-23 ENCOUNTER — Encounter: Payer: Self-pay | Admitting: Radiation Oncology

## 2011-11-23 VITALS — BP 135/70 | HR 73 | Temp 98.0°F | Wt 183.2 lb

## 2011-11-23 DIAGNOSIS — C61 Malignant neoplasm of prostate: Secondary | ICD-10-CM

## 2011-11-23 NOTE — Assessment & Plan Note (Signed)
  Radiation Oncology         [phone]) [phone] ________________________________  Name: Craig Henderson MRN: [id number]  Date: 11/23/2011  DOB: 08-26-1929  Weekly Radiation Therapy Management  Diagnosis: Stage T1c Adenocarcinoma, with a Gleason's Score of 4+5=9 and a PSA=12.45  Current Dose: 43.2 Gy     Planned Dose:  75 Gy  Narrative . . . . . . . . The patient presents for routine under treatment assessment.                                                     The patient is without complaint except mild urgency.                                 Set-up films were reviewed.                                 The chart was checked. Physical Findings. . Ceasar Mons Vitals:   11/23/11 0854  BP: 135/70  Pulse: 73  Temp: 98 F (36.7 C)  Weight essentially stable.  No significant changes. Impression . . . . . . . The patient is  tolerating radiation. Plan . . . . . . . . . . . . Continue treatment as planned.  ________________________________  Artist Pais. Kathrynn Running, M.D.

## 2011-11-23 NOTE — Progress Notes (Signed)
Prostate cancer  Radiation Oncology         (336) [phone] ________________________________  Name: NICHOLA WARREN MRN: [id number]  Date: 11/23/2011  DOB: 17-Oct-1929  Weekly Radiation Therapy Management  Diagnosis: Stage T1c Adenocarcinoma, with a Gleason's Score of 4+5=9 and a PSA=12.45  Current Dose: 43.2 Gy     Planned Dose:  75 Gy  Narrative . . . . . . . . The patient presents for routine under treatment assessment.                                                     The patient is without complaint except mild urgency.  He is using one immodium daily to prevent diarrhea.                                 Set-up films were reviewed.                                 The chart was checked. Physical Findings. . Ceasar Mons Vitals:   11/23/11 0854  BP: 135/70  Pulse: 73  Temp: 98 F (36.7 C)  Weight essentially stable.  No significant changes. Impression . . . . . . . The patient is  tolerating radiation. Plan . . . . . . . . . . . . Continue treatment as planned.  ________________________________  Artist Pais. Kathrynn Running, M.D.

## 2011-11-23 NOTE — Progress Notes (Signed)
Patient here for weekly under treat visit of radiation to pelvis for prostate cancer.Completed 24 of 40 treatments.Chest congestion has improved.Generalized fatigue. Not urinating as frequently .Occasional urgency.

## 2011-11-26 ENCOUNTER — Ambulatory Visit
Admission: RE | Admit: 2011-11-26 | Discharge: 2011-11-26 | Disposition: A | Discharge status: Home / Self Care | Payer: Medicare Other | Source: Ambulatory Visit | Attending: Radiation Oncology | Admitting: Radiation Oncology

## 2011-11-27 ENCOUNTER — Ambulatory Visit
Admission: RE | Admit: 2011-11-27 | Discharge: 2011-11-27 | Disposition: A | Discharge status: Home / Self Care | Payer: Medicare Other | Source: Ambulatory Visit | Attending: Radiation Oncology | Admitting: Radiation Oncology

## 2011-11-28 ENCOUNTER — Ambulatory Visit
Admission: RE | Admit: 2011-11-28 | Discharge: 2011-11-28 | Disposition: A | Discharge status: Home / Self Care | Payer: Medicare Other | Source: Ambulatory Visit | Attending: Radiation Oncology | Admitting: Radiation Oncology

## 2011-11-29 ENCOUNTER — Ambulatory Visit
Admission: RE | Admit: 2011-11-29 | Discharge: 2011-11-29 | Disposition: A | Discharge status: Home / Self Care | Payer: Medicare Other | Source: Ambulatory Visit | Attending: Radiation Oncology | Admitting: Radiation Oncology

## 2011-11-30 ENCOUNTER — Encounter: Payer: Self-pay | Admitting: Radiation Oncology

## 2011-11-30 ENCOUNTER — Ambulatory Visit
Admission: RE | Admit: 2011-11-30 | Discharge: 2011-11-30 | Disposition: A | Discharge status: Home / Self Care | Payer: Medicare Other | Source: Ambulatory Visit | Attending: Radiation Oncology | Admitting: Radiation Oncology

## 2011-11-30 VITALS — BP 161/67 | HR 86 | Temp 98.0°F | Wt 183.6 lb

## 2011-11-30 DIAGNOSIS — C61 Malignant neoplasm of prostate: Secondary | ICD-10-CM

## 2011-11-30 NOTE — Progress Notes (Signed)
  Radiation Oncology         [phone] ________________________________  Name: Craig Henderson MRN: [id number]  Date: 11/30/2011  DOB: Jan 02, 1930  Weekly Radiation Therapy Management  Current Dose: 53 Gy     Planned Dose:  75 Gy  Narrative . . . . . . . . The patient presents for routine under treatment assessment.                                             Patient here for weekly routine doctor assessment for pelvic radiation (prostate cancer) .Has completed 29 of 40 treatments.Has been constipated last 2 days. Continued fatigue has not done any of his regular activities this week. Slow urine stream. Nocturia up to 4 times.                                 Set-up films were reviewed.                                 The chart was checked. Physical Findings. . . Weight essentially stable.  No significant changes. Impression . . . . . . . The patient is  tolerating radiation. Plan . . . . . . . . . . . . Continue treatment as planned. Encouraged to increase fruits and vegetables, fiber and water intake.May take stool softener as well.  ________________________________  Artist Pais. Kathrynn Running, M.D.

## 2011-11-30 NOTE — Progress Notes (Signed)
Patient here for weekly routine doctor assessment for pelvic radiation (prostate cancer) .Has completed 29 of 40 treatments.Has been constipated last 2 days.Encouraged to increase fruits and vegetables, fiber and water intake.May take stool softener as well.Continued fatigue has not done any of his regular activities this week.Slow urine stream. Nocturia up to 4 times.

## 2011-12-03 ENCOUNTER — Ambulatory Visit
Admission: RE | Admit: 2011-12-03 | Discharge: 2011-12-03 | Disposition: A | Discharge status: Home / Self Care | Payer: Medicare Other | Source: Ambulatory Visit | Attending: Radiation Oncology | Admitting: Radiation Oncology

## 2011-12-04 ENCOUNTER — Ambulatory Visit
Admission: RE | Admit: 2011-12-04 | Discharge: 2011-12-04 | Disposition: A | Discharge status: Home / Self Care | Payer: Medicare Other | Source: Ambulatory Visit | Attending: Radiation Oncology | Admitting: Radiation Oncology

## 2011-12-04 DIAGNOSIS — E7800 Pure hypercholesterolemia, unspecified: Secondary | ICD-10-CM | POA: Insufficient documentation

## 2011-12-04 DIAGNOSIS — C61 Malignant neoplasm of prostate: Secondary | ICD-10-CM | POA: Insufficient documentation

## 2011-12-04 DIAGNOSIS — E119 Type 2 diabetes mellitus without complications: Secondary | ICD-10-CM | POA: Insufficient documentation

## 2011-12-04 DIAGNOSIS — Z79899 Other long term (current) drug therapy: Secondary | ICD-10-CM | POA: Insufficient documentation

## 2011-12-04 DIAGNOSIS — Z510 Encounter for antineoplastic radiation therapy: Secondary | ICD-10-CM | POA: Insufficient documentation

## 2011-12-04 DIAGNOSIS — I10 Essential (primary) hypertension: Secondary | ICD-10-CM | POA: Insufficient documentation

## 2011-12-05 ENCOUNTER — Ambulatory Visit
Admission: RE | Admit: 2011-12-05 | Discharge: 2011-12-05 | Disposition: A | Discharge status: Home / Self Care | Payer: Medicare Other | Source: Ambulatory Visit | Attending: Radiation Oncology | Admitting: Radiation Oncology

## 2011-12-06 ENCOUNTER — Ambulatory Visit
Admission: RE | Admit: 2011-12-06 | Discharge: 2011-12-06 | Disposition: A | Discharge status: Home / Self Care | Payer: Medicare Other | Source: Ambulatory Visit | Attending: Radiation Oncology | Admitting: Radiation Oncology

## 2011-12-07 ENCOUNTER — Ambulatory Visit
Admission: RE | Admit: 2011-12-07 | Discharge: 2011-12-07 | Disposition: A | Discharge status: Home / Self Care | Payer: Medicare Other | Source: Ambulatory Visit | Attending: Radiation Oncology | Admitting: Radiation Oncology

## 2011-12-07 ENCOUNTER — Encounter: Payer: Self-pay | Admitting: Radiation Oncology

## 2011-12-07 VITALS — BP 131/57 | HR 72 | Temp 98.1°F | Wt 179.1 lb

## 2011-12-07 DIAGNOSIS — C61 Malignant neoplasm of prostate: Secondary | ICD-10-CM

## 2011-12-07 NOTE — Progress Notes (Signed)
  Radiation Oncology         [phone]) [phone] ________________________________  Name: Craig Henderson MRN: [id number]  Date: 12/07/2011  DOB: 1929/11/01  Weekly Radiation Therapy Management  Current Dose: 63 Gy     Planned Dose:  75 Gy  Narrative . . . . . . . . The patient presents for routine under treatment assessment                         Patient here for weekly under treat assessment of prostate (pelvic) radiation.Patient is doing well from treatment standpoint.No good bowel movement in last week.To pick up laxative today.Has improved appetite but still needs to push po fluid intake, fruits and vegetables.Generalized fatigu continues.Patient has completed 34 of 40 treatments.          The patient is without complaint.                                 Set-up films were reviewed.                                 The chart was checked. Physical Findings. . . Weight essentially stable.   Filed Vitals:   12/07/11 0857  BP: 131/57  Pulse: 72  Temp: 98.1 F (36.7 C)   No significant changes. Impression . . . . . . . The patient is  tolerating radiation. Plan . . . . . . . . . . . . Continue treatment as planned.  ________________________________  Artist Pais. Kathrynn Running, M.D.

## 2011-12-07 NOTE — Progress Notes (Signed)
Patient here for weekly under treat assessment of prostate (pelvic) radiation.Patient is doing well from treatment standpoint.No good bowel movement in last week.To pick up laxative today.Has improved appetite but still needs to push po fluid intake, fruits and vegetables.Generalized fatigu continues.Patient has completed 34 of 40 treatments.

## 2011-12-11 ENCOUNTER — Ambulatory Visit
Admission: RE | Admit: 2011-12-11 | Discharge: 2011-12-11 | Disposition: A | Discharge status: Home / Self Care | Payer: Medicare Other | Source: Ambulatory Visit | Attending: Radiation Oncology | Admitting: Radiation Oncology

## 2011-12-12 ENCOUNTER — Ambulatory Visit
Admission: RE | Admit: 2011-12-12 | Discharge: 2011-12-12 | Disposition: A | Discharge status: Home / Self Care | Payer: Medicare Other | Source: Ambulatory Visit | Attending: Radiation Oncology | Admitting: Radiation Oncology

## 2011-12-13 ENCOUNTER — Ambulatory Visit
Admission: RE | Admit: 2011-12-13 | Discharge: 2011-12-13 | Disposition: A | Discharge status: Home / Self Care | Payer: Medicare Other | Source: Ambulatory Visit | Attending: Radiation Oncology | Admitting: Radiation Oncology

## 2011-12-14 ENCOUNTER — Ambulatory Visit
Admission: RE | Admit: 2011-12-14 | Discharge: 2011-12-14 | Disposition: A | Discharge status: Home / Self Care | Payer: Medicare Other | Source: Ambulatory Visit | Attending: Radiation Oncology | Admitting: Radiation Oncology

## 2011-12-14 ENCOUNTER — Encounter: Payer: Self-pay | Admitting: Radiation Oncology

## 2011-12-14 VITALS — BP 132/65 | HR 74 | Temp 98.1°F | Resp 20 | Wt 180.3 lb

## 2011-12-14 DIAGNOSIS — C61 Malignant neoplasm of prostate: Secondary | ICD-10-CM

## 2011-12-14 NOTE — Progress Notes (Signed)
Pt reports nocturia x 4, states no dysuria this past week. Bowel constipation issues resolved. Pt is fatigued, denies loss of appetite. Pt completes after 2 additional tx; has FU card.

## 2011-12-14 NOTE — Progress Notes (Signed)
  Radiation Oncology         (336) [phone] ________________________________  Name: Craig Henderson MRN: [id number]  Date: 12/14/2011  DOB: 09/25/1929  Weekly Radiation Therapy Management  Current Dose: 71 Gy     Planned Dose:  75 Gy  Narrative . . . . . . . . The patient presents for routine under treatment assessment.                                   The patient is without complaint.                                 Set-up films were reviewed.                                 The chart was checked. Physical Findings. . . Weight essentially stable.  Filed Vitals:   12/14/11 0844  BP: 132/65  Pulse: 74  Temp: 98.1 F (36.7 C)  Resp: 20  No significant changes. Impression . . . . . . . The patient is  tolerating radiation. Plan . . . . . . . . . . . . Continue treatment as planned Complete radiation Tuesday as scheduled, and follow-up in one month. The patient was encouraged to call or return to the clinic in the interim for any worsening symptoms. ________________________________  Artist Pais Kathrynn Running, M.D.

## 2011-12-17 ENCOUNTER — Ambulatory Visit
Admission: RE | Admit: 2011-12-17 | Discharge: 2011-12-17 | Disposition: A | Discharge status: Home / Self Care | Payer: Medicare Other | Source: Ambulatory Visit | Attending: Radiation Oncology | Admitting: Radiation Oncology

## 2011-12-18 ENCOUNTER — Encounter: Payer: Self-pay | Admitting: Radiation Oncology

## 2011-12-18 ENCOUNTER — Ambulatory Visit
Admission: RE | Admit: 2011-12-18 | Discharge: 2011-12-18 | Disposition: A | Discharge status: Home / Self Care | Payer: Medicare Other | Source: Ambulatory Visit | Attending: Radiation Oncology | Admitting: Radiation Oncology

## 2011-12-24 NOTE — Progress Notes (Signed)
  Radiation Oncology         [phone] ________________________________  Name: Craig Henderson MRN: [id number]  Date: 12/18/2011  DOB: [birth year]  End of Treatment Note  Diagnosis:  78 year old gentleman with stage T1c adenocarcinoma prostate Gleason score 4+5 and a PSA of 12.45   Indication for treatment:  Curative       Radiation treatment dates:  10/23/2011-12/18/2011  Site/dose:    1. The prostate seminal vesicles and lymph nodes were initially treated to 45 gray in 25 fractions of 1.8 gray. The patient was setup of an alpha cradle immobilization device and underwent megavoltage CT imaging daily for treatment position. 2. The prostate only was boosted to the final dose of 75 gray with 15 additional fractions of 2 gray using the same techniques as above.  Beams/energy:    1. The patient was treated on the TomoTherapy unit with helical intensity modulated radiotherapy delivering 6 megavolt photons according to an inverse optimized plan. 2. During the boost, the patient was treated on the TomoTherapy unit with helical intensity modulated radiotherapy delivering 6 megavolt photons according to an inverse optimized plan.  Narrative: The patient tolerated radiation treatment relatively well.   He experienced some of the typical toxicities associated with radiation. Specifically, during his pelvic radiation he did experience a few bouts of loose stools and diarrhea. He also noted urinary frequency as well as urinary discomfort through the entirety of his radiation course.  Plan: The patient has completed radiation treatment. The patient will return to radiation oncology clinic for routine followup in one month. I advised them to call or return sooner if they have any questions or concerns related to their recovery or treatment. ________________________________  Artist Pais. Kathrynn Running, M.D.

## 2012-01-15 ENCOUNTER — Encounter: Payer: Self-pay | Admitting: Radiation Oncology

## 2012-01-17 ENCOUNTER — Encounter: Payer: Self-pay | Admitting: Radiation Oncology

## 2012-01-17 ENCOUNTER — Ambulatory Visit
Admission: RE | Admit: 2012-01-17 | Discharge: 2012-01-17 | Disposition: A | Discharge status: Home / Self Care | Payer: Medicare Other | Source: Ambulatory Visit | Attending: Radiation Oncology | Admitting: Radiation Oncology

## 2012-01-17 VITALS — BP 145/64 | HR 85 | Temp 98.5°F | Resp 20 | Wt 178.7 lb

## 2012-01-17 DIAGNOSIS — I10 Essential (primary) hypertension: Secondary | ICD-10-CM | POA: Insufficient documentation

## 2012-01-17 DIAGNOSIS — E1165 Type 2 diabetes mellitus with hyperglycemia: Secondary | ICD-10-CM | POA: Insufficient documentation

## 2012-01-17 DIAGNOSIS — C61 Malignant neoplasm of prostate: Secondary | ICD-10-CM

## 2012-01-17 NOTE — Progress Notes (Signed)
Pt c/o "muscle ache/pain in top of left leg x 1 week". He states trying to lift leg or making a sharp turn sends a sharp pain up leg". Pt states he will call his orthopedist, Dr Montez Morita today and make an appt. Pt has hx injury and surgery on left femur from 1995. Pt states his urinary status is "his normal for him". Nocturia x 4, daytime freq q2-3 hrs. He denies other urinary issues, states he has BM QOD.

## 2012-01-17 NOTE — Progress Notes (Signed)
  Radiation Oncology         ([phone]) [phone] ________________________________  Name: Craig Henderson MRN: [id number]  Date: 01/17/2012  DOB: 1930-02-21  Follow-Up Visit Note  CC: [name], MD  [name], MD  Diagnosis:   78 year old gentleman with stage T1c adenocarcinoma prostate Gleason score 4+5 and a PSA of 12.45   Interval Since Last Radiation:  1 months  Narrative:  The patient returns today for routine follow-up.   His urinary symptoms are better.                              ALLERGIES:   has no known allergies.  Meds: Current Outpatient Prescriptions  Medication Sig Dispense Refill  . amLODipine-benazepril (LOTREL) 10-40 MG per capsule       . glyBURIDE-metformin (GLUCOVANCE) 5-500 MG per tablet       . metFORMIN (GLUCOPHAGE) 500 MG tablet Take 500 mg by mouth daily with breakfast.      . Multiple Vitamin (MULTIVITAMIN) capsule Take 1 capsule by mouth daily.      . multivitamin-lutein (OCUVITE-LUTEIN) CAPS Take 1 capsule by mouth daily.        Physical Findings: The patient is in no acute distress. Patient is alert and oriented.  vitals were not taken for this visit..  No significant changes.  Lab Findings: Lab Results  Component Value Date   WBC 10.4 03/28/2009   HGB 13.3 07/03/2010   HCT 39.0 07/03/2010   MCV 91.7 03/28/2009   PLT 233 03/28/2009    @LASTCHEM @  Radiographic Findings: No results found.  Impression:  The patient is recovering from the effects of radiation.    Plan:  He'll continue to follow in urology and return here as needed.  He needs to follow-up with Dr. [name], for ongoing hormone therapy.  _____________________________________  Artist Pais. Kathrynn Running, M.D.

## 2012-01-17 NOTE — Patient Instructions (Signed)
Call if you have any questions.

## 2012-01-30 ENCOUNTER — Other Ambulatory Visit: Payer: Self-pay | Admitting: Orthopedic Surgery

## 2012-01-30 ENCOUNTER — Ambulatory Visit
Admission: RE | Admit: 2012-01-30 | Discharge: 2012-01-30 | Disposition: A | Discharge status: Home / Self Care | Payer: Medicare Other | Source: Ambulatory Visit | Attending: Orthopedic Surgery | Admitting: Orthopedic Surgery

## 2012-01-30 DIAGNOSIS — M5430 Sciatica, unspecified side: Secondary | ICD-10-CM

## 2012-02-06 ENCOUNTER — Other Ambulatory Visit: Payer: Self-pay | Admitting: Orthopedic Surgery

## 2012-02-06 DIAGNOSIS — M25559 Pain in unspecified hip: Secondary | ICD-10-CM

## 2012-02-06 DIAGNOSIS — IMO0002 Reserved for concepts with insufficient information to code with codable children: Secondary | ICD-10-CM

## 2012-02-13 ENCOUNTER — Inpatient Hospital Stay: Admission: RE | Admit: 2012-02-13 | Payer: Medicare Other | Source: Ambulatory Visit

## 2012-02-14 NOTE — Progress Notes (Signed)
Blood work drawn for future MRI spine from right AC, site (U) and pt tolerated well.

## 2012-02-20 ENCOUNTER — Inpatient Hospital Stay
Admission: RE | Admit: 2012-02-20 | Discharge: 2012-02-20 | Payer: Medicare Other | Source: Ambulatory Visit | Attending: Orthopedic Surgery | Admitting: Orthopedic Surgery

## 2012-03-04 ENCOUNTER — Emergency Department (HOSPITAL_COMMUNITY): Payer: Medicare Other

## 2012-03-04 ENCOUNTER — Inpatient Hospital Stay (HOSPITAL_COMMUNITY)
Admission: EM | Admit: 2012-03-04 | Discharge: 2012-03-17 | DRG: 071 | Disposition: A | Discharge status: Skilled Nursing Facility | Payer: Medicare Other | Attending: Internal Medicine | Admitting: Internal Medicine

## 2012-03-04 ENCOUNTER — Encounter (HOSPITAL_COMMUNITY): Payer: Self-pay | Admitting: Family Medicine

## 2012-03-04 DIAGNOSIS — R4182 Altered mental status, unspecified: Secondary | ICD-10-CM

## 2012-03-04 DIAGNOSIS — IMO0001 Reserved for inherently not codable concepts without codable children: Secondary | ICD-10-CM | POA: Diagnosis present

## 2012-03-04 DIAGNOSIS — Z79899 Other long term (current) drug therapy: Secondary | ICD-10-CM

## 2012-03-04 DIAGNOSIS — R339 Retention of urine, unspecified: Secondary | ICD-10-CM | POA: Diagnosis present

## 2012-03-04 DIAGNOSIS — E7800 Pure hypercholesterolemia, unspecified: Secondary | ICD-10-CM | POA: Diagnosis present

## 2012-03-04 DIAGNOSIS — N179 Acute kidney failure, unspecified: Secondary | ICD-10-CM | POA: Diagnosis present

## 2012-03-04 DIAGNOSIS — Z794 Long term (current) use of insulin: Secondary | ICD-10-CM

## 2012-03-04 DIAGNOSIS — R509 Fever, unspecified: Secondary | ICD-10-CM | POA: Diagnosis present

## 2012-03-04 DIAGNOSIS — E870 Hyperosmolality and hypernatremia: Secondary | ICD-10-CM | POA: Diagnosis present

## 2012-03-04 DIAGNOSIS — N138 Other obstructive and reflux uropathy: Secondary | ICD-10-CM | POA: Diagnosis present

## 2012-03-04 DIAGNOSIS — R739 Hyperglycemia, unspecified: Secondary | ICD-10-CM

## 2012-03-04 DIAGNOSIS — C61 Malignant neoplasm of prostate: Secondary | ICD-10-CM

## 2012-03-04 DIAGNOSIS — Z8546 Personal history of malignant neoplasm of prostate: Secondary | ICD-10-CM

## 2012-03-04 DIAGNOSIS — D649 Anemia, unspecified: Secondary | ICD-10-CM | POA: Diagnosis not present

## 2012-03-04 DIAGNOSIS — D72829 Elevated white blood cell count, unspecified: Secondary | ICD-10-CM | POA: Diagnosis present

## 2012-03-04 DIAGNOSIS — R338 Other retention of urine: Secondary | ICD-10-CM | POA: Diagnosis not present

## 2012-03-04 DIAGNOSIS — K219 Gastro-esophageal reflux disease without esophagitis: Secondary | ICD-10-CM | POA: Diagnosis present

## 2012-03-04 DIAGNOSIS — Z87891 Personal history of nicotine dependence: Secondary | ICD-10-CM

## 2012-03-04 DIAGNOSIS — K560 Paralytic ileus: Secondary | ICD-10-CM | POA: Diagnosis present

## 2012-03-04 DIAGNOSIS — G9341 Metabolic encephalopathy: Principal | ICD-10-CM | POA: Diagnosis present

## 2012-03-04 DIAGNOSIS — E1165 Type 2 diabetes mellitus with hyperglycemia: Secondary | ICD-10-CM

## 2012-03-04 DIAGNOSIS — E876 Hypokalemia: Secondary | ICD-10-CM | POA: Diagnosis present

## 2012-03-04 DIAGNOSIS — K5900 Constipation, unspecified: Secondary | ICD-10-CM | POA: Diagnosis present

## 2012-03-04 DIAGNOSIS — N12 Tubulo-interstitial nephritis, not specified as acute or chronic: Secondary | ICD-10-CM | POA: Diagnosis present

## 2012-03-04 DIAGNOSIS — E872 Acidosis, unspecified: Secondary | ICD-10-CM | POA: Diagnosis present

## 2012-03-04 DIAGNOSIS — E119 Type 2 diabetes mellitus without complications: Secondary | ICD-10-CM

## 2012-03-04 DIAGNOSIS — I10 Essential (primary) hypertension: Secondary | ICD-10-CM

## 2012-03-04 DIAGNOSIS — R7309 Other abnormal glucose: Secondary | ICD-10-CM

## 2012-03-04 DIAGNOSIS — Z923 Personal history of irradiation: Secondary | ICD-10-CM

## 2012-03-04 DIAGNOSIS — K567 Ileus, unspecified: Secondary | ICD-10-CM | POA: Diagnosis present

## 2012-03-04 DIAGNOSIS — N401 Enlarged prostate with lower urinary tract symptoms: Secondary | ICD-10-CM | POA: Diagnosis present

## 2012-03-04 LAB — URINALYSIS, ROUTINE W REFLEX MICROSCOPIC
Glucose, UA: >1000 mg/dL — AB
Ketones, ur: NEGATIVE mg/dL
Nitrite: NEGATIVE
Urobilinogen, UA: 0.2 mg/dL (ref 0.0–1.0)
pH: 5.5 (ref 5.0–8.0)

## 2012-03-04 LAB — URINE MICROSCOPIC-ADD ON

## 2012-03-04 LAB — CREATININE, SERUM
Creatinine, Ser: 0.8 mg/dL (ref 0.50–1.35)
GFR calc non Af Amer: 82 mL/min — ABNORMAL LOW (ref >90)

## 2012-03-04 LAB — CBC
Hemoglobin: 12 g/dL — ABNORMAL LOW (ref 13.0–17.0)
MCH: 31.9 pg (ref 26.0–34.0)
MCHC: 35.5 g/dL (ref 30.0–36.0)
Platelets: 182 10*3/uL (ref 150–400)
RDW: 12.5 % (ref 11.5–15.5)

## 2012-03-04 LAB — BASIC METABOLIC PANEL
CO2: 25 mEq/L (ref 19–32)
Calcium: 10.2 mg/dL (ref 8.4–10.5)
Chloride: 94 mEq/L — ABNORMAL LOW (ref 96–112)
Potassium: 4.4 mEq/L (ref 3.5–5.1)
Sodium: 133 mEq/L — ABNORMAL LOW (ref 135–145)

## 2012-03-04 LAB — GLUCOSE, CAPILLARY
Glucose-Capillary: 402 mg/dL — ABNORMAL HIGH (ref 70–99)
Glucose-Capillary: 316 mg/dL — ABNORMAL HIGH (ref 70–99)
Glucose-Capillary: 283 mg/dL — ABNORMAL HIGH (ref 70–99)
Glucose-Capillary: 152 mg/dL — ABNORMAL HIGH (ref 70–99)

## 2012-03-04 LAB — HEPATIC FUNCTION PANEL
Bilirubin, Direct: <0.1 mg/dL (ref 0.0–0.3)
Total Protein: 7.2 g/dL (ref 6.0–8.3)

## 2012-03-04 LAB — POCT I-STAT, CHEM 8
Creatinine, Ser: 0.9 mg/dL (ref 0.50–1.35)
Hemoglobin: 10.2 g/dL — ABNORMAL LOW (ref 13.0–17.0)
Sodium: 143 mEq/L (ref 135–145)
TCO2: 23 mmol/L (ref 0–100)

## 2012-03-04 LAB — ETHANOL: Alcohol, Ethyl (B): <11 mg/dL (ref 0–11)

## 2012-03-04 LAB — CBC WITH DIFFERENTIAL/PLATELET
Eosinophils Relative: 0 % (ref 0–5)
Hemoglobin: 12.5 g/dL — ABNORMAL LOW (ref 13.0–17.0)
Lymphocytes Relative: 6 % — ABNORMAL LOW (ref 12–46)
Lymphs Abs: 0.7 10*3/uL (ref 0.7–4.0)
MCV: 89.3 fL (ref 78.0–100.0)
Monocytes Relative: 4 % (ref 3–12)
Platelets: 191 10*3/uL (ref 150–400)
RBC: 3.93 MIL/uL — ABNORMAL LOW (ref 4.22–5.81)
WBC: 11.3 10*3/uL — ABNORMAL HIGH (ref 4.0–10.5)

## 2012-03-04 LAB — LIPASE, BLOOD: Lipase: 19 U/L (ref 11–59)

## 2012-03-04 LAB — RAPID URINE DRUG SCREEN, HOSP PERFORMED
Amphetamines: NOT DETECTED
Barbiturates: NOT DETECTED
Benzodiazepines: NOT DETECTED

## 2012-03-04 LAB — CK TOTAL AND CKMB (NOT AT ARMC): CK, MB: 3.4 ng/mL (ref 0.3–4.0)

## 2012-03-04 LAB — AMMONIA: Ammonia: 39 umol/L (ref 11–60)

## 2012-03-04 MED ORDER — LABETALOL HCL 5 MG/ML IV SOLN
5.0000 mg | INTRAVENOUS | Status: DC | PRN
  Administered 2012-03-04 – 2012-03-15 (×3): 5 mg via INTRAVENOUS
  Filled 2012-03-04 (×3): qty 4

## 2012-03-04 MED ORDER — DEXTROSE 5 % IV SOLN
10.0000 mg/kg | Freq: Three times a day (TID) | INTRAVENOUS | Status: DC
  Administered 2012-03-04 – 2012-03-07 (×8): 775 mg via INTRAVENOUS
  Filled 2012-03-04 (×10): qty 15.5

## 2012-03-04 MED ORDER — INSULIN ASPART 100 UNIT/ML ~~LOC~~ SOLN
0.0000 [IU] | SUBCUTANEOUS | Status: DC
  Administered 2012-03-04: 5 [IU] via SUBCUTANEOUS
  Administered 2012-03-04 (×2): 3 [IU] via SUBCUTANEOUS
  Administered 2012-03-05: 5 [IU] via SUBCUTANEOUS
  Administered 2012-03-05 (×2): 3 [IU] via SUBCUTANEOUS

## 2012-03-04 MED ORDER — ONDANSETRON HCL 4 MG PO TABS: 4.0000 mg | ORAL_TABLET | Freq: Four times a day (QID) | ORAL | Status: DC | PRN

## 2012-03-04 MED ORDER — SODIUM CHLORIDE 0.9 % IV SOLN
Freq: Once | INTRAVENOUS | Status: AC
  Administered 2012-03-04: 09:00:00 via INTRAVENOUS

## 2012-03-04 MED ORDER — LORAZEPAM 2 MG/ML IJ SOLN
1.0000 mg | Freq: Once | INTRAMUSCULAR | Status: AC
  Administered 2012-03-04: 1 mg via INTRAVENOUS
  Filled 2012-03-04: qty 1

## 2012-03-04 MED ORDER — LORAZEPAM 2 MG/ML IJ SOLN
1.0000 mg | INTRAMUSCULAR | Status: DC | PRN
  Administered 2012-03-04 – 2012-03-05 (×4): 1 mg via INTRAVENOUS
  Filled 2012-03-04 (×5): qty 1

## 2012-03-04 MED ORDER — SODIUM CHLORIDE 0.9 % IV BOLUS (SEPSIS)
1000.0000 mL | Freq: Once | INTRAVENOUS | Status: AC
  Administered 2012-03-04: 1000 mL via INTRAVENOUS

## 2012-03-04 MED ORDER — THIAMINE HCL 100 MG/ML IJ SOLN
100.0000 mg | Freq: Every day | INTRAMUSCULAR | Status: DC
  Administered 2012-03-04 – 2012-03-06 (×3): 100 mg via INTRAVENOUS
  Filled 2012-03-04 (×3): qty 1

## 2012-03-04 MED ORDER — ONDANSETRON HCL 4 MG/2ML IJ SOLN: 4.0000 mg | Freq: Four times a day (QID) | INTRAMUSCULAR | Status: DC | PRN

## 2012-03-04 MED ORDER — POTASSIUM CHLORIDE CRYS ER 20 MEQ PO TBCR
40.0000 meq | EXTENDED_RELEASE_TABLET | Freq: Once | ORAL | Status: AC
  Administered 2012-03-04: 40 meq via ORAL
  Filled 2012-03-04: qty 2

## 2012-03-04 MED ORDER — ACETAMINOPHEN 325 MG PO TABS: 650.0000 mg | ORAL_TABLET | Freq: Four times a day (QID) | ORAL | Status: DC | PRN

## 2012-03-04 MED ORDER — SODIUM CHLORIDE 0.9 % IV SOLN
INTRAVENOUS | Status: DC
  Administered 2012-03-04: 18:00:00 via INTRAVENOUS
  Administered 2012-03-05: 100 mL/h via INTRAVENOUS
  Administered 2012-03-05 – 2012-03-08 (×5): via INTRAVENOUS

## 2012-03-04 MED ORDER — DEXTROSE-NACL 5-0.45 % IV SOLN: INTRAVENOUS | Status: DC

## 2012-03-04 MED ORDER — ACETAMINOPHEN 650 MG RE SUPP: 650.0000 mg | Freq: Four times a day (QID) | RECTAL | Status: DC | PRN

## 2012-03-04 MED ORDER — SODIUM CHLORIDE 0.9 % IV SOLN
INTRAVENOUS | Status: DC
  Administered 2012-03-04: 07:00:00 via INTRAVENOUS
  Administered 2012-03-04: 4.5 [IU]/h via INTRAVENOUS
  Administered 2012-03-04: 1.8 [IU]/h via INTRAVENOUS
  Administered 2012-03-04: 0.4 [IU]/h via INTRAVENOUS
  Filled 2012-03-04: qty 1

## 2012-03-04 MED ORDER — ENOXAPARIN SODIUM 30 MG/0.3ML ~~LOC~~ SOLN
30.0000 mg | SUBCUTANEOUS | Status: DC
  Administered 2012-03-04: 30 mg via SUBCUTANEOUS
  Filled 2012-03-04 (×2): qty 0.3

## 2012-03-04 NOTE — ED Notes (Signed)
Pt oxygen level dropped to 87%RA.  2L [place] applied, Pt at 100%

## 2012-03-04 NOTE — Care Management Note (Addendum)
    Page 1 of 1   03/17/2012     3:25:39 PM   CARE MANAGEMENT NOTE 03/17/2012  Patient:  Craig Henderson, Craig Henderson   Account Number:  [id number]  Date Initiated:  03/04/2012  Documentation initiated by:  Junius Creamer  Subjective/Objective Assessment:   adm w hyperglycemia     Action/Plan:   lives w wife, pcp dr Adrian Saran bland  PT/OT evals-recommending SNF   Anticipated DC Date:  03/17/2012   Anticipated DC Plan:  SKILLED NURSING FACILITY  In-house referral  Clinical Social Worker      DC Planning Services  CM consult      Choice offered to / List presented to:             Status of service:  Completed, signed off Medicare Important Message given?   (If response is "NO", the following Medicare IM given date fields will be blank) Date Medicare IM given:   Date Additional Medicare IM given:    Discharge Disposition:  SKILLED NURSING FACILITY  Per UR Regulation:  Reviewed for med. necessity/level of care/duration of stay  If discussed at Long Length of Stay Meetings, dates discussed:   03/11/2012  03/13/2012    Comments:  12/2 9:50a debbie dowell rn,bsn still w hypernatremia.spoke w pt and wife. he has not been gettingup yet. will probably need pt/ot eval. explained role of case manager. wife petite lade so pt will have to be strong at disch.   11/26 16:30p debbie dowell rn,bsn [phone]

## 2012-03-04 NOTE — ED Provider Notes (Signed)
Patient placed in the CDU on a glucose stabilizer awaiting a decrease in his blood sugar.    8:55 AM On exam the patient is agitated, rolling around in the bed, taking his down off and unable to hold a conversation or answer all questions.  Wife states this began with minor confusion approximately 3 weeks ago this progressed to complete confusion and urinary incontinence.  Patient with a history of prostate cancer; he finished his radiation treatment approximately 4 weeks ago.  He has no history of dementia and has never had any episodes like this in the past.  The symptoms began gradually, they have been persistent and gradually worsening.  Patient is also complaining of right upper quadrant abdominal pain.  He states this is been doing on for 2 days prior to getting in the car yesterday.  He is unable to clarify further.    PE: Constitutional: well-developed, well-nourished, no apparent distress HENT: normocephalic, atraumatic Cardiovascular: normal rate and rhythm, distal pulses intact Pulmonary/Chest: effort normal; breath sounds clear and equal bilaterally; no wheezes or rales Abdominal: soft and nontender, nontender the right upper quadrant  Musculoskeletal: full ROM, no edema Lymphadenopathy: no cervical adenopathy Neurological: alert, unable to hold a conversation, unable to orient  Skin: warm and dry, no rash, no diaphoresis Psychiatric: patient agitated, restless  Ordered head CT, hepatic function panel, ammonia and acute abdominal series.    Patient nontender to palpation, without nausea or vomiting.  Concern for possible gallstones versus prostate cancer metastasis.   11:30 AM  RN states the patient is now increasingly agitated and combative.  On reassessment the patient is altered, unable to orient to person place or time, making incoherent statements and attempting to get out of bed.   Agitated, attempting to take his gown off, stating that he wants to go to [place]. Ativan 1  mg IV given with some improvement.  Patient blood sugar less than 150.  Family at bedside states that he has had increasing forgetfulness and confusion in the last few weeks he has never acted like this before.  Head CT negative, hepatic function panel within normal limits, ammonia level within normal limits, urinalysis without evidence of urinary tract infection, CBC with mild leukocytosis of 11.3.  Proceed with admission  2:30pm Pt becoming agitated again.  Will re-dose Ativan.  Ativan 1mg  PRN order written.  Temp admission orders placed.  Dr Jomarie Longs from [facility] has assessed the patient and he will be admitted.  Repeat BMP with mild hypokalemia but otherwise unremarkable.    Dahlia Client Shayda Kalka, PA-C 03/04/12 1608

## 2012-03-04 NOTE — ED Notes (Signed)
Patient transported to CT 

## 2012-03-04 NOTE — ED Notes (Signed)
Family at bedside. 

## 2012-03-04 NOTE — H&P (Addendum)
[facility]          History and Physical    PCP:  [name], MD   Chief Complaint:  Not acting right  HPI: Craig Henderson is a 82/M with PMH of Prostate CA treated with XRT 8/13, DM, HTN was in his usual state of health 2-3 weeks ago. His family has noticed periods of disorientation over the last 2-3 weeks, he would say things that didn't make sense like thinking he was in [place], talking about breakfast at dinner time etc. Yesterday he went to visit his wife who is temporarily staying with her parents late at night which is very late for him as he is usually in bed by then. This morning his wife got a call from the police saying that he was found confused in his car outside a church, he was brought to ED by EMS. Was found to be hyperglycemic with CBG of 500 without an anion gap. His Blood glucose has improved with Insulin gtt, remains agitated and confused. Family denies any Fevers, chills, N/V/Diarrhea, urinary symptoms, cough, congestion, SoB No recent medication changes  Allergies:  No Known Allergies    Past Medical History  Diagnosis Date  . Prostate cancer 07/18/11    bx=adenocarcinoma=PSA=12.45,gleason=4+5=9,4+3=7,4+4=8, 3+3=6 & 3+7,volume=156.35cc  . Diabetes mellitus   . Hypertension   . Hypercholesterolemia   . BPH (benign prostatic hyperplasia)     with nocturia,  . ED (erectile dysfunction)   . Hx of radiation therapy 10/23/11 - 12/18/11    prostate, seminal vesicles, lymph nodes    Past Surgical History  Procedure Date  . Prostate biopsy 07/18/2011    gleason 4+5=9  . Rotator cuff repair 2011  . Fracture surgery 35 years    right leg/rod placement    Prior to Admission medications   Medication Sig Start Date End Date Taking? Authorizing Provider  amLODipine-benazepril (LOTREL) 10-40 MG per capsule Take 1 capsule by mouth daily.  10/31/11  Yes Historical Provider, MD  etodolac (LODINE) 400 MG tablet Take 400 mg by mouth 2 (two) times  daily.   Yes Historical Provider, MD  glyBURIDE-metformin (GLUCOVANCE) 5-500 MG per tablet  10/01/11  Yes Historical Provider, MD  multivitamin-lutein (OCUVITE-LUTEIN) CAPS Take 1 capsule by mouth daily.   Yes Historical Provider, MD    Social History:  reports that he quit smoking about 39 years ago. His smoking use included Cigarettes. He has a 10 pack-year smoking history. He has never used smokeless tobacco. He reports that he drinks alcohol. His drug history not on file.  No family history on file.  Review of Systems:  Constitutional: Denies fever, chills, diaphoresis, appetite change and fatigue.  HEENT: Denies photophobia, eye pain, redness, hearing loss, ear pain, congestion, sore throat, rhinorrhea, sneezing, mouth sores, trouble swallowing, neck pain, neck stiffness and tinnitus.   Respiratory: Denies SOB, DOE, cough, chest tightness,  and wheezing.   Cardiovascular: Denies chest pain, palpitations and leg swelling.  Gastrointestinal: Denies nausea, vomiting, abdominal pain, diarrhea, constipation, blood in stool and abdominal distention.  Genitourinary: Denies dysuria, urgency, frequency, hematuria, flank pain and difficulty urinating.  Musculoskeletal: Denies myalgias, back pain, joint swelling, arthralgias and gait problem.  Skin: Denies pallor, rash and wound.  Neurological: Denies dizziness, seizures, syncope, weakness, light-headedness, numbness and headaches.  Hematological: Denies adenopathy. Easy bruising, personal or family bleeding history  Psychiatric/Behavioral: Denies suicidal ideation, mood changes, confusion, nervousness, sleep disturbance and agitation   Physical Exam: Blood pressure 135/68, pulse 88, temperature 98.9 F (37.2 C),  temperature source Oral, resp. rate 18, SpO2 100.00%. GEn: drowsy, lethargic, but agitated, moving around, very figity HEENT: Pupils small 3mm reactive, EOMI CVS: S1S2/RRR Lungs: CTAB ABd: soft, Nt, BS present, BS present Ext: no  edema c/c Neuro:  No neck stiffness, mild photophobia Motor and sensory system difficult to assess, increased tone in all extremities,  Reflexes 2 plus, DTR withdrawal Skin: no rashes   Labs on Admission:  Results for orders placed during the hospital encounter of 03/04/12 (from the past 48 hour(s))  BASIC METABOLIC PANEL     Status: Abnormal   Collection Time   03/04/12  5:28 AM      Component Value Range Comment   Sodium 133 (*) 135 - 145 mEq/L    Potassium 4.4  3.5 - 5.1 mEq/L    Chloride 94 (*) 96 - 112 mEq/L    CO2 25  19 - 32 mEq/L    Glucose, Bld 501 (*) 70 - 99 mg/dL    BUN 16  6 - 23 mg/dL    Creatinine, Ser 4.78  0.50 - 1.35 mg/dL    Calcium 29.5  8.4 - 10.5 mg/dL    GFR calc non Af Amer 67 (*) >90 mL/min    GFR calc Af Amer 78 (*) >90 mL/min   CBC WITH DIFFERENTIAL     Status: Abnormal   Collection Time   03/04/12  5:28 AM      Component Value Range Comment   WBC 11.3 (*) 4.0 - 10.5 K/uL    RBC 3.93 (*) 4.22 - 5.81 MIL/uL    Hemoglobin 12.5 (*) 13.0 - 17.0 g/dL    HCT 62.1 (*) 30.8 - 52.0 %    MCV 89.3  78.0 - 100.0 fL    MCH 31.8  26.0 - 34.0 pg    MCHC 35.6  30.0 - 36.0 g/dL    RDW 65.7  84.6 - 96.2 %    Platelets 191  150 - 400 K/uL    Neutrophils Relative 90 (*) 43 - 77 %    Neutro Abs 10.1 (*) 1.7 - 7.7 K/uL    Lymphocytes Relative 6 (*) 12 - 46 %    Lymphs Abs 0.7  0.7 - 4.0 K/uL    Monocytes Relative 4  3 - 12 %    Monocytes Absolute 0.4  0.1 - 1.0 K/uL    Eosinophils Relative 0  0 - 5 %    Eosinophils Absolute 0.0  0.0 - 0.7 K/uL    Basophils Relative 0  0 - 1 %    Basophils Absolute 0.0  0.0 - 0.1 K/uL   TROPONIN I     Status: Normal   Collection Time   03/04/12  5:28 AM      Component Value Range Comment   Troponin I <0.30  <0.30 ng/mL   LIPASE, BLOOD     Status: Normal   Collection Time   03/04/12  5:28 AM      Component Value Range Comment   Lipase 19  11 - 59 U/L   GLUCOSE, CAPILLARY     Status: Abnormal   Collection Time   03/04/12   5:37 AM      Component Value Range Comment   Glucose-Capillary 402 (*) 70 - 99 mg/dL   URINALYSIS, ROUTINE W REFLEX MICROSCOPIC     Status: Abnormal   Collection Time   03/04/12  6:05 AM      Component Value Range Comment   Color, Urine  YELLOW  YELLOW    APPearance CLEAR  CLEAR    Specific Gravity, Urine 1.024  1.005 - 1.030    pH 5.5  5.0 - 8.0    Glucose, UA >1000 (*) NEGATIVE mg/dL    Hgb urine dipstick NEGATIVE  NEGATIVE    Bilirubin Urine NEGATIVE  NEGATIVE    Ketones, ur NEGATIVE  NEGATIVE mg/dL    Protein, ur NEGATIVE  NEGATIVE mg/dL    Urobilinogen, UA 0.2  0.0 - 1.0 mg/dL    Nitrite NEGATIVE  NEGATIVE    Leukocytes, UA NEGATIVE  NEGATIVE   URINE MICROSCOPIC-ADD ON     Status: Normal   Collection Time   03/04/12  6:05 AM      Component Value Range Comment   Squamous Epithelial / LPF RARE  RARE    WBC, UA 0-2  <3 WBC/hpf    RBC / HPF 0-2  <3 RBC/hpf    Bacteria, UA RARE  RARE   GLUCOSE, CAPILLARY     Status: Abnormal   Collection Time   03/04/12  7:23 AM      Component Value Range Comment   Glucose-Capillary 316 (*) 70 - 99 mg/dL   GLUCOSE, CAPILLARY     Status: Abnormal   Collection Time   03/04/12  8:28 AM      Component Value Range Comment   Glucose-Capillary 283 (*) 70 - 99 mg/dL   HEPATIC FUNCTION PANEL     Status: Normal   Collection Time   03/04/12  9:48 AM      Component Value Range Comment   Total Protein 7.2  6.0 - 8.3 g/dL    Albumin 3.7  3.5 - 5.2 g/dL    AST 15  0 - 37 U/L    ALT 16  0 - 53 U/L    Alkaline Phosphatase 115  39 - 117 U/L    Total Bilirubin 0.3  0.3 - 1.2 mg/dL    Bilirubin, Direct <1.6  0.0 - 0.3 mg/dL    Indirect Bilirubin NOT CALCULATED  0.3 - 0.9 mg/dL   GLUCOSE, CAPILLARY     Status: Abnormal   Collection Time   03/04/12  9:50 AM      Component Value Range Comment   Glucose-Capillary 152 (*) 70 - 99 mg/dL   AMMONIA     Status: Normal   Collection Time   03/04/12  9:52 AM      Component Value Range Comment   Ammonia 39   11 - 60 umol/L   POCT I-STAT, CHEM 8     Status: Abnormal   Collection Time   03/04/12  1:49 PM      Component Value Range Comment   Sodium 143  135 - 145 mEq/L    Potassium 3.2 (*) 3.5 - 5.1 mEq/L    Chloride 109  96 - 112 mEq/L    BUN 12  6 - 23 mg/dL    Creatinine, Ser 1.09  0.50 - 1.35 mg/dL    Glucose, Bld 63 (*) 70 - 99 mg/dL    Calcium, Ion 6.04  5.40 - 1.30 mmol/L    TCO2 23  0 - 100 mmol/L    Hemoglobin 10.2 (*) 13.0 - 17.0 g/dL    HCT 98.1 (*) 19.1 - 52.0 %     Radiological Exams on Admission: Ct Head Wo Contrast  03/04/2012  *RADIOLOGY REPORT*  Clinical Data: Altered mental status.  CT HEAD WITHOUT CONTRAST  Technique:  Contiguous axial images  were obtained from the base of the skull through the vertex without contrast.  Comparison: None.  Findings: Mild diffuse cortical atrophy is noted.  Bony calvarium is intact.  Mild chronic ischemic white matter disease is noted. No mass effect or midline shift is noted.  Ventricular size is within normal limits.  There is no evidence of mass lesion, hemorrhage or acute infarction.  IMPRESSION: Chronic ischemic white matter disease.  Diffuse cortical atrophy. No acute intracranial abnormality seen.   Original Report Authenticated By: Lupita Raider.,  M.D.    Dg Chest Port 1 View  03/04/2012  *RADIOLOGY REPORT*  Clinical Data: Hyperglycemia.  PORTABLE CHEST - 1 VIEW  Comparison: Chest radiograph performed 03/28/2009  Findings: The lungs are relatively well-aerated; pulmonary vascularity is at the upper limits of normal.  There is no evidence of focal opacification, pleural effusion or pneumothorax.  The cardiomediastinal silhouette is within normal limits.  No acute osseous abnormalities are seen.  IMPRESSION: No acute cardiopulmonary process seen.   Original Report Authenticated By: Tonia Ghent, M.D.    Dg Abd Acute W/chest  03/04/2012  *RADIOLOGY REPORT*  Clinical Data: Abdominal pain.  ACUTE ABDOMEN SERIES (ABDOMEN 2 VIEW & CHEST 1  VIEW)  Comparison: Chest x-ray 03/04/2012.  Findings: The upright chest x-ray demonstrates no acute cardiopulmonary findings.  Two views of the abdomen demonstrate a moderate air and stool throughout the colon and down into the rectum.  There are also air filled loops of small bowel without significant distention or significant air-fluid levels.  No free air.  Findings could be due to ileus or gastroenteritis.  No findings for obstruction or perforation.  The soft tissue shadows are grossly maintained. Aortic calcifications are noted.  The bony structures are intact.  IMPRESSION:  1.  No acute cardiopulmonary findings. 2.  Bowel gas pattern may suggest an ileus or gastroenteritis.  No findings for obstruction or perforation.   Original Report Authenticated By: Rudie Meyer, M.D.     Assessment/Plan 1. Altered mental status: etiology unclear Etiology unclear UA, CT head, CXR, electrolytes unremarkable Calcium, ammonia level normal Check UDS Also check urine culture, although UA appears benign ETOH withdrawal is a small possibility, but according to wife only drinks 1 drink/day Unclear when last drink was Will start IV thiamine, MVI, IVF IV ativan PRN  MRI brain Requested Neuro consult  2. DM: hyperglycemic initially without DKA, wean off insulin gtt, Novolog SSI Q4 while NPO  3. H/o prostate CA : s/p XRT completed in 8/13  DVT proph: lovenox  FULL code:  Family communication: discussed with wife and children at bedside Disposition: stepdown   Time Spent on Admission:  Guerline Happ [facility] Pager: [phone] 03/04/2012, 3:00 PM

## 2012-03-04 NOTE — ED Notes (Signed)
bg 104

## 2012-03-04 NOTE — ED Provider Notes (Addendum)
History     CSN: [id number]  Arrival date & time 03/04/12  0508   First MD Initiated Contact with Patient 03/04/12 0522      Chief Complaint  Patient presents with  . Altered Mental Status    (Consider location/radiation/quality/duration/timing/severity/associated sxs/prior treatment) The history is provided by the patient and the EMS personnel. The history is limited by the condition of the patient.  Craig Henderson has niddm.   He was found in his car by the police. They called ems due to "ams."  ems noted that he had an increased CBG.  They brought him to ed for eval.  He denies pain.  He denies  n /v/vision changes.  He denies cough, sob or recent illness.  He denies recent change in meds.   He denies smoking or etoh use.  He has not had recent illness.  Level 5 caveat for severe illness.   Past Medical History  Diagnosis Date  . Prostate cancer 07/18/11    bx=adenocarcinoma=PSA=12.45,gleason=4+5=9,4+3=7,4+4=8, 3+3=6 & 3+7,volume=156.35cc  . Diabetes mellitus   . Hypertension   . Hypercholesterolemia   . BPH (benign prostatic hyperplasia)     with nocturia,  . ED (erectile dysfunction)   . Hx of radiation therapy 10/23/11 - 12/18/11    prostate, seminal vesicles, lymph nodes    Past Surgical History  Procedure Date  . Prostate biopsy 07/18/2011    gleason 4+5=9  . Rotator cuff repair 2011  . Fracture surgery 35 years    right leg/rod placement    No family history on file.  History  Substance Use Topics  . Smoking status: Former Smoker -- 1.0 packs/day for 10 years    Types: Cigarettes    Quit date: 09/04/1972  . Smokeless tobacco: Never Used     Comment: quit 20 years ago  . Alcohol Use: 0.0 oz/week      Review of Systems  Constitutional: Negative for fever and chills.  HENT: Negative for neck pain.   Eyes: Negative for visual disturbance.  Respiratory: Negative for cough and shortness of breath.   Cardiovascular: Negative for chest pain.  Gastrointestinal: Negative  for nausea, vomiting, abdominal pain and diarrhea.  Musculoskeletal: Negative for back pain.  Neurological: Negative for weakness and headaches.  Hematological: Does not bruise/bleed easily.  All other systems reviewed and are negative.    Allergies  Review of patient's allergies indicates no known allergies.  Home Medications   Current Outpatient Rx  Name  Route  Sig  Dispense  Refill  . AMLODIPINE BESY-BENAZEPRIL HCL 10-40 MG PO CAPS               . GLYBURIDE-METFORMIN 5-500 MG PO TABS               . METFORMIN HCL 500 MG PO TABS   Oral   Take 500 mg by mouth daily with breakfast.         . OCUVITE-LUTEIN PO CAPS   Oral   Take 1 capsule by mouth daily.           There were no vitals taken for this visit.  Physical Exam  Nursing note reviewed. Constitutional: He appears well-developed and well-nourished. No distress.  HENT:  Head: Normocephalic and atraumatic.  Eyes: Conjunctivae normal are normal.  Neck: Normal range of motion. Neck supple.  Cardiovascular: Normal rate, regular rhythm and intact distal pulses.   No murmur heard. Pulmonary/Chest: Effort normal and breath sounds normal. No respiratory distress. He has no rales.  Abdominal: Soft. Bowel sounds are normal. He exhibits no distension. There is no tenderness. There is no guarding.  Musculoskeletal: Normal range of motion. He exhibits no edema.  Neurological: He is alert. No cranial nerve deficit.       Mild confusion.  Craig Henderson says he is "between [facility] and [employer]."  He does not say he is in the er.  He has slow responses to questions, but answers appropriately.    Moves all 4 extremities against gravity  Skin: Skin is warm and dry.  Psychiatric: He has a normal mood and affect. Thought content normal.    ED Course  Procedures (including critical care time)  Labs Reviewed - No data to display No results found.   No diagnosis found.  ECG Normal sinus rhythm at 92 beats per  minute. Normal axis. First degree AV block. Nonspecific T-wave changes  MDM  Hyperglycemia         Cheri Guppy, MD 03/04/12 [id number]  Cheri Guppy, MD 03/04/12 [phone]
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
## 2012-03-05 ENCOUNTER — Inpatient Hospital Stay (HOSPITAL_COMMUNITY): Payer: Medicare Other

## 2012-03-05 DIAGNOSIS — R509 Fever, unspecified: Secondary | ICD-10-CM | POA: Diagnosis present

## 2012-03-05 DIAGNOSIS — IMO0001 Reserved for inherently not codable concepts without codable children: Secondary | ICD-10-CM

## 2012-03-05 DIAGNOSIS — I10 Essential (primary) hypertension: Secondary | ICD-10-CM | POA: Diagnosis present

## 2012-03-05 DIAGNOSIS — K567 Ileus, unspecified: Secondary | ICD-10-CM | POA: Diagnosis present

## 2012-03-05 DIAGNOSIS — D72829 Elevated white blood cell count, unspecified: Secondary | ICD-10-CM | POA: Diagnosis present

## 2012-03-05 DIAGNOSIS — G9341 Metabolic encephalopathy: Principal | ICD-10-CM

## 2012-03-05 LAB — BASIC METABOLIC PANEL
CO2: 22 mEq/L (ref 19–32)
Calcium: 9.2 mg/dL (ref 8.4–10.5)
GFR calc Af Amer: 88 mL/min — ABNORMAL LOW (ref >90)
GFR calc non Af Amer: 76 mL/min — ABNORMAL LOW (ref >90)
Sodium: 140 mEq/L (ref 135–145)

## 2012-03-05 LAB — VITAMIN B12: Vitamin B-12: >2000 pg/mL — ABNORMAL HIGH (ref 211–911)

## 2012-03-05 LAB — GLUCOSE, CAPILLARY
Glucose-Capillary: 186 mg/dL — ABNORMAL HIGH (ref 70–99)
Glucose-Capillary: 224 mg/dL — ABNORMAL HIGH (ref 70–99)
Glucose-Capillary: 192 mg/dL — ABNORMAL HIGH (ref 70–99)
Glucose-Capillary: 165 mg/dL — ABNORMAL HIGH (ref 70–99)

## 2012-03-05 LAB — CBC
Platelets: 200 10*3/uL (ref 150–400)
RBC: 3.63 MIL/uL — ABNORMAL LOW (ref 4.22–5.81)
WBC: 9.8 10*3/uL (ref 4.0–10.5)

## 2012-03-05 LAB — HEMOGLOBIN A1C: Hgb A1c MFr Bld: 10.9 % — ABNORMAL HIGH (ref <5.7)

## 2012-03-05 MED ORDER — INSULIN ASPART 100 UNIT/ML ~~LOC~~ SOLN
0.0000 [IU] | SUBCUTANEOUS | Status: DC
  Administered 2012-03-05 (×2): 3 [IU] via SUBCUTANEOUS
  Administered 2012-03-05 – 2012-03-06 (×2): 4 [IU] via SUBCUTANEOUS

## 2012-03-05 MED ORDER — INSULIN GLARGINE 100 UNIT/ML ~~LOC~~ SOLN
15.0000 [IU] | Freq: Every day | SUBCUTANEOUS | Status: DC
  Administered 2012-03-05 – 2012-03-17 (×13): 15 [IU] via SUBCUTANEOUS

## 2012-03-05 MED ORDER — ENALAPRILAT 1.25 MG/ML IV SOLN
0.6250 mg | Freq: Four times a day (QID) | INTRAVENOUS | Status: DC
  Administered 2012-03-05 – 2012-03-06 (×5): 0.625 mg via INTRAVENOUS
  Filled 2012-03-05 (×10): qty 0.5

## 2012-03-05 MED ORDER — HYDRALAZINE HCL 20 MG/ML IJ SOLN
10.0000 mg | Freq: Four times a day (QID) | INTRAMUSCULAR | Status: DC
  Administered 2012-03-05 – 2012-03-06 (×4): 10 mg via INTRAVENOUS
  Filled 2012-03-05 (×4): qty 1
  Filled 2012-03-05 (×4): qty 0.5

## 2012-03-05 MED ORDER — ENOXAPARIN SODIUM 40 MG/0.4ML ~~LOC~~ SOLN
40.0000 mg | SUBCUTANEOUS | Status: DC
  Administered 2012-03-05: 40 mg via SUBCUTANEOUS
  Filled 2012-03-05: qty 0.4

## 2012-03-05 NOTE — Progress Notes (Signed)
[facility] Progress Note [facility] TEAM 1 - Stepdown/ICU TEAM   Craig Henderson ZOX:[id number] DOB: July 10, 1929 DOA: 03/04/2012 PCP: [name], MD  Brief narrative: 82/M with PMH of Prostate CA treated with XRT 8/13, DM, HTN was in his usual state of health 2-3 weeks ago. His family has noticed periods of disorientation over the last 2-3 weeks, he would say things that didn't make sense like thinking he was in [place], talking about breakfast at dinner time etc.  The day prior to his admit he went to visit his wife who is temporarily staying with her parents late at night which is very late for him as he is usually in bed by then.  The morning of his admit his wife got a call from the police saying that he was found confused in his car [place].  He was brought to ED by EMS, and was found to be hyperglycemic with CBG of 500 without an anion gap. His Blood glucose improved with Insulin gtt, but he remained agitated and confused.  Family denied Fevers, chills, N/V/Diarrhea, urinary symptoms, cough, congestion, SOB, or recent medication changes.  Assessment/Plan:  Altered mental status  CT scan reveals diffuse cortical atrophy but no acute abnormality is noted - MRI is of poor quality due to patient motion but no acute insult/mass is appreciable - urinalysis was unrevealing at admission - urine drug screen serum calcium and ammonia levels are all unrevealing - neurology has initiated acyclovir for possible HSV encephalitis and is considering an LP  Prostate cancer Need her CT scan or MRI revealed evidence of brain masses  Diabetes mellitus - severely uncontrolled CBG much improved from the time of admission - adjust ongoing treatment and follow  Hypertension Blood pressure remains elevated - adjust treatment and follow  Hypercholesterolemia  Code Status: Full Family Communication: No family present at time of evaluation Disposition Plan: Remain in step down  unit  Consultants: Neurology  Procedures: None  Antibiotics: Acyclovir 03/04/2012  DVT prophylaxis: Lovenox  HPI/Subjective: At the time of my visit the patient is quite lethargic.  His pupils are pinpoint but equal bilaterally.  He reveals no evidence whatsoever of photophobia.  He cannot follow simple commands and does not communicate in any affective way.  No family is present at time of my evaluation.   Objective: Blood pressure 147/66, pulse 96, temperature 99 F (37.2 C), temperature source Oral, resp. rate 17, height 5\' 8"  (1.727 m), weight 77.7 kg (171 lb 4.8 oz), SpO2 100.00%.  Intake/Output Summary (Last 24 hours) at 03/05/12 1417 Last data filed at 03/05/12 0500  Gross per 24 hour  Intake 1081.67 ml  Output    800 ml  Net 281.67 ml     Exam: General: No acute respiratory distress Lungs: Clear to auscultation bilaterally without wheezes or crackles with exception to mild bibasilar crackles Cardiovascular: Regular rate and rhythm without murmur gallop or rub normal S1 and S2 Abdomen: Nontender, nondistended, soft, bowel sounds positive, no rebound, no ascites, no appreciable mass Extremities: No significant cyanosis, clubbing, or edema bilateral lower extremities NEURO: Markedly lethargic, unable to follow simple commands, pupils pinpoint and minimally reactive bilaterally but equal, no evidence of photophobia, no posturing, facies symmetric with no grossly appreciable cranial nerve abnormalities  Data Reviewed: Basic Metabolic Panel:  Lab 03/05/12 8119 03/04/12 1658 03/04/12 1349 03/04/12 0528  NA 140 -- 143 133*  K 3.6 -- 3.2* 4.4  CL 103 -- 109 94*  CO2 22 -- -- 25  GLUCOSE 224* -- 63* 501*  BUN 9 -- 12 16  CREATININE 0.97 0.80 0.90 1.02  CALCIUM 9.2 -- -- 10.2  MG -- -- -- --  PHOS -- -- -- --   Liver Function Tests:  Lab 03/04/12 0948  AST 15  ALT 16  ALKPHOS 115  BILITOT 0.3  PROT 7.2  ALBUMIN 3.7    Lab 03/04/12 0528  LIPASE 19   AMYLASE --    Lab 03/04/12 0952  AMMONIA 39   CBC:  Lab 03/05/12 0837 03/04/12 1658 03/04/12 1349 03/04/12 0528  WBC 9.8 13.4* -- 11.3*  NEUTROABS -- -- -- 10.1*  HGB 11.0* 12.0* 10.2* 12.5*  HCT 33.1* 33.8* 30.0* 35.1*  MCV 91.2 89.9 -- 89.3  PLT 200 182 -- 191   Cardiac Enzymes:  Lab 03/04/12 1458 03/04/12 0528  CKTOTAL 278* --  CKMB 3.4 --  CKMBINDEX -- --  TROPONINI -- <0.30   CBG:  Lab 03/05/12 0825 03/05/12 0333 03/04/12 2318 03/04/12 1932 03/04/12 1616  GLUCAP 224* 186* 167* 229* 164*    Studies:  Recent x-ray studies have been reviewed in detail by the Attending Physician  Scheduled Meds:  Reviewed in detail by the Attending Physician   Lonia Blood, MD [facility] Office  [phone] Pager [phone]  On-Call/Text Page:      [email or web address]      password [email or web address]  If 7PM-7AM, please contact night-coverage [email or web address] Password [email or web address] 03/05/2012, 2:17 PM   LOS: 1 day

## 2012-03-05 NOTE — ED Provider Notes (Signed)
Pt originally seen by Dr. Weldon Inches.  Pt in CDU for hyperglycemia.  However further history suggests episodes of acute delirium, worse in past 1-2 weeks per family.  Head CT shows no acute.  Will admit to medicine team.    Gavin Pound. Oletta Lamas, MD 03/05/12 [phone]

## 2012-03-05 NOTE — Progress Notes (Signed)
Pt observed restless intermittently through noc but able to settle down. However, this am pt pulled condom cath out and took gown off and attempting to get out of bed, all attempts to reorient pt futile, pt medicated with ativan 1mg , will cont to monitor closely. Bed alarm on.

## 2012-03-05 NOTE — Progress Notes (Signed)
Subjective: Continues to be confused. After discussing with his wife, it seems that he was normal up until last week, when he began having confusion. This worsened until yesterday when he was brought to the emergency room.   Exam: Filed Vitals:   03/05/12 1515  BP: 172/88  Pulse: 99  Temp: 98.9 F (37.2 C)  Resp: 20   Gen: In bed, NAD MS: Awakens easily, does not follow commands. does not speak.  ZO:XWRUEA to threat, PERRL, fixates and tracks. Motor: moves all extremities well Sensory:repsonds to noxious stimuli  B12 - high Ammonia - 39 LFTs - wnl uds/ua - wnl other than glucose  Impression: 78 yo M with progressive confusion over several days to weeks. I do not feel that presentation is consistent with bacterial meningitis, but an encephalitis is possible and will need to be assessed. He had a limited MRI that does not show gross infarct. His EEG is suggestive of a left occipital cortical dysfunction.   He got enoxaparin this afternoon and therefore will postpone LP until tomorrow, treating empirically for encephalitis in the interim.   Also possible is an idiosyncratic reaction to his chemotherapy, or paraneoplastic syndrome.   Recommendations: 1)Will plan for LP tomorrow 2) Continue empiric acyclovir at this time.  3) TSH, RPR, cortisol 4) Further recommendations following LP.   Ritta Slot, MD [facility] [phone]  If 7pm- 7am, please page neurology on call at [phone].

## 2012-03-05 NOTE — Progress Notes (Signed)
Inpatient Diabetes Program Recommendations  AACE/ADA: New Consensus Statement on Inpatient Glycemic Control (2013)  Target Ranges:  Prepandial:   less than 140 mg/dL      Peak postprandial:   less than 180 mg/dL (1-2 hours)      Critically ill patients:  140 - 180 mg/dL   Reason for Visit: Results for Craig Henderson, Craig Henderson (MRN [id number]) as of 03/05/2012 10:05  Ref. Range 03/04/2012 16:16 03/04/2012 19:32 03/04/2012 23:18 03/05/2012 03:33 03/05/2012 08:25  Glucose-Capillary Latest Range: 70-99 mg/dL 829 (H) 562 (H) 130 (H) 186 (H) 224 (H)   Note patient admitted with high CBG's and was on insulin drip.  Please consider starting Lantus 10 units daily.  Also please check A1C to determine pre hospitalization glycemic control.

## 2012-03-05 NOTE — Progress Notes (Signed)
EEG completed at bedside. Neurology notified

## 2012-03-05 NOTE — Procedures (Signed)
History: 78 yo M with altered mental status  Sedation: None  Background: There is a well defined posterior dominant rhythm of 9 Hz that attenuates with eye opening on the right, on the left, this is present but at a reduced amplitude. There is also generalized irregular delta activity.   Photic stimulation: Physiologic driving is not performed  EEG Diagnosis: 1) Attenuation of posterior dominant rhythm on the left 2) Generalized irregular slow activity.   Clinical Interpretation: This EEG is suggestive of a focal left posterior quadrant cerebral dysfunction(as can be seen in stroke or other cortical lesion) in the setting of a generalized nonspecific cerebral dysfunction. There was no seizure or seizure predisposition recorded on this study.   Ritta Slot, MD [facility] [phone]  If 7pm- 7am, please page neurology on call at [phone].

## 2012-03-05 NOTE — Progress Notes (Signed)
INITIAL ADULT NUTRITION ASSESSMENT Date: 03/05/2012   Time: 10:16 AM Reason for Assessment: MST (Malnutrition Screening Tool)   INTERVENTION: 1. RD to follow nutrition needs   DOCUMENTATION CODES Per approved criteria  -Not Applicable     ASSESSMENT: Male 78 y.o.  Dx: AMS  Hx:  Past Medical History  Diagnosis Date  . Prostate cancer 07/18/11    bx=adenocarcinoma=PSA=12.45,gleason=4+5=9,4+3=7,4+4=8, 3+3=6 & 3+7,volume=156.35cc  . Diabetes mellitus   . Hypertension   . Hypercholesterolemia   . BPH (benign prostatic hyperplasia)     with nocturia,  . ED (erectile dysfunction)   . Hx of radiation therapy 10/23/11 - 12/18/11    prostate, seminal vesicles, lymph nodes    Past Surgical History  Procedure Date  . Prostate biopsy 07/18/2011    gleason 4+5=9  . Rotator cuff repair 2011  . Fracture surgery 35 years    right leg/rod placement    Related Meds:     . acyclovir  10 mg/kg Intravenous Q8H  . enalaprilat  0.625 mg Intravenous Q6H  . enoxaparin (LOVENOX) injection  30 mg Subcutaneous Q24H  . insulin aspart  0-15 Units Subcutaneous Q4H  . [COMPLETED] LORazepam  1 mg Intravenous Once  . thiamine  100 mg Intravenous Daily  . [DISCONTINUED] dextrose 5 % and 0.45% NaCl   Intravenous STAT     Ht: 5\' 8"  (172.7 cm)  Wt: 171 lb 4.8 oz (77.7 kg)  Ideal Wt: 70 kg  % Ideal Wt: 110%  Usual Wt:  Wt Readings from Last 10 Encounters:  03/04/12 171 lb 4.8 oz (77.7 kg)  01/17/12 178 lb 11.2 oz (81.058 kg)  12/14/11 180 lb 4.8 oz (81.784 kg)  12/07/11 179 lb 1.6 oz (81.239 kg)  11/30/11 183 lb 9.6 oz (83.28 kg)  11/23/11 183 lb 3.2 oz (83.099 kg)  11/15/11 185 lb 9.6 oz (84.188 kg)  11/08/11 183 lb 9.6 oz (83.28 kg)  11/02/11 185 lb 3.2 oz (84.006 kg)  10/25/11 185 lb (83.915 kg)    % Usual Wt:92%  Body mass index is 26.05 kg/(m^2). Pt is overweight per current BMI   Food/Nutrition Related Hx: unable to obtain from pt at this time.   Labs:  CMP     Component  Value Date/Time   NA 143 03/04/2012 1349   K 3.2* 03/04/2012 1349   CL 109 03/04/2012 1349   CO2 25 03/04/2012 0528   GLUCOSE 63* 03/04/2012 1349   BUN 12 03/04/2012 1349   CREATININE 0.80 03/04/2012 1658   CALCIUM 10.2 03/04/2012 0528   PROT 7.2 03/04/2012 0948   ALBUMIN 3.7 03/04/2012 0948   AST 15 03/04/2012 0948   ALT 16 03/04/2012 0948   ALKPHOS 115 03/04/2012 0948   BILITOT 0.3 03/04/2012 0948   GFRNONAA 82* 03/04/2012 1658   GFRAA >90 03/04/2012 1658     No results found for this basename: HGBA1C     Intake/Output Summary (Last 24 hours) at 03/05/12 1018 Last data filed at 03/05/12 0500  Gross per 24 hour  Intake 1081.67 ml  Output    800 ml  Net 281.67 ml     Diet Order: NPO  Supplements/Tube Feeding: none   IVF:    sodium chloride Last Rate: 100 mL/hr (03/05/12 0524)  [DISCONTINUED] insulin (NOVOLIN-R) infusion Last Rate: Stopped (03/04/12 1140)    Estimated Nutritional Needs:   Kcal: 1750-1900 Protein: 75-90 gm  Fluid: 1.8-1.9 L   Pt admitted with AMS of unknown etiology. Pt with hx of prostate cancer, s/p  radiation therapy. Weight hx shows weight trending down over the last several months, more so in the past 2 months. Weight loss is not meet criteria for malnutrition.   Pt unable to provide any hx at this time. RD will monitor PO intake and weight this admission for adequacy.   NUTRITION DIAGNOSIS: Inadequate oral intake related to AMS as evidenced by NPO.  MONITORING/EVALUATION(Goals): Goal: Diet advance to meet nutrition needs  Monitor: PO intake, weight, labs  EDUCATION NEEDS: -No education needs identified at this time   Clarene Duke RD, LDN Pager [phone] After Hours pager [phone]  03/05/2012, 10:16 AM

## 2012-03-06 LAB — COMPREHENSIVE METABOLIC PANEL
CO2: 21 mEq/L (ref 19–32)
Calcium: 9.2 mg/dL (ref 8.4–10.5)
Creatinine, Ser: 1.75 mg/dL — ABNORMAL HIGH (ref 0.50–1.35)
GFR calc Af Amer: 41 mL/min — ABNORMAL LOW (ref >90)
GFR calc non Af Amer: 35 mL/min — ABNORMAL LOW (ref >90)
Glucose, Bld: 140 mg/dL — ABNORMAL HIGH (ref 70–99)
Total Bilirubin: 0.9 mg/dL (ref 0.3–1.2)

## 2012-03-06 LAB — GLUCOSE, CAPILLARY
Glucose-Capillary: 118 mg/dL — ABNORMAL HIGH (ref 70–99)
Glucose-Capillary: 186 mg/dL — ABNORMAL HIGH (ref 70–99)
Glucose-Capillary: 172 mg/dL — ABNORMAL HIGH (ref 70–99)
Glucose-Capillary: 152 mg/dL — ABNORMAL HIGH (ref 70–99)

## 2012-03-06 LAB — GRAM STAIN

## 2012-03-06 LAB — CBC
Hemoglobin: 11.3 g/dL — ABNORMAL LOW (ref 13.0–17.0)
MCHC: 35.1 g/dL (ref 30.0–36.0)
Platelets: 188 10*3/uL (ref 150–400)
RBC: 3.5 MIL/uL — ABNORMAL LOW (ref 4.22–5.81)

## 2012-03-06 LAB — URINE CULTURE
Colony Count: NO GROWTH
Culture: NO GROWTH

## 2012-03-06 LAB — PROTEIN AND GLUCOSE, CSF: Glucose, CSF: 100 mg/dL — ABNORMAL HIGH (ref 43–76)

## 2012-03-06 LAB — CSF CELL COUNT WITH DIFFERENTIAL: Other Cells, CSF: 0

## 2012-03-06 LAB — CRYPTOCOCCAL ANTIGEN, CSF: Crypto Ag: NEGATIVE

## 2012-03-06 MED ORDER — AMLODIPINE BESYLATE 10 MG PO TABS
10.0000 mg | ORAL_TABLET | Freq: Every day | ORAL | Status: DC
  Administered 2012-03-06 – 2012-03-07 (×2): 10 mg via ORAL
  Filled 2012-03-06 (×2): qty 1

## 2012-03-06 MED ORDER — VITAMIN B-1 100 MG PO TABS
100.0000 mg | ORAL_TABLET | Freq: Every day | ORAL | Status: DC
  Administered 2012-03-07: 100 mg via ORAL
  Filled 2012-03-06 (×2): qty 1

## 2012-03-06 MED ORDER — HALOPERIDOL LACTATE 5 MG/ML IJ SOLN
2.0000 mg | Freq: Four times a day (QID) | INTRAMUSCULAR | Status: DC | PRN
  Administered 2012-03-07 – 2012-03-09 (×6): 5 mg via INTRAVENOUS
  Filled 2012-03-06 (×6): qty 1

## 2012-03-06 MED ORDER — ONDANSETRON HCL 4 MG PO TABS: 4.0000 mg | ORAL_TABLET | Freq: Three times a day (TID) | ORAL | Status: DC | PRN

## 2012-03-06 MED ORDER — SODIUM CHLORIDE 0.9 % IV BOLUS (SEPSIS)
250.0000 mL | Freq: Once | INTRAVENOUS | Status: AC
  Administered 2012-03-06: 250 mL via INTRAVENOUS

## 2012-03-06 MED ORDER — HALOPERIDOL LACTATE 5 MG/ML IJ SOLN
INTRAMUSCULAR | Status: AC
  Administered 2012-03-06: 2 mg
  Filled 2012-03-06: qty 1

## 2012-03-06 MED ORDER — POTASSIUM CHLORIDE CRYS ER 20 MEQ PO TBCR
20.0000 meq | EXTENDED_RELEASE_TABLET | Freq: Two times a day (BID) | ORAL | Status: DC
  Administered 2012-03-06 (×2): 20 meq via ORAL
  Filled 2012-03-06 (×3): qty 1

## 2012-03-06 MED ORDER — INSULIN ASPART 100 UNIT/ML ~~LOC~~ SOLN
0.0000 [IU] | Freq: Three times a day (TID) | SUBCUTANEOUS | Status: DC
  Administered 2012-03-06: 7 [IU] via SUBCUTANEOUS
  Administered 2012-03-06: 4 [IU] via SUBCUTANEOUS
  Administered 2012-03-07 (×2): 7 [IU] via SUBCUTANEOUS

## 2012-03-06 MED ORDER — ACETAMINOPHEN 325 MG PO TABS
650.0000 mg | ORAL_TABLET | Freq: Four times a day (QID) | ORAL | Status: DC | PRN
Start: 2012-03-06 — End: 2012-03-07

## 2012-03-06 MED ORDER — POTASSIUM CHLORIDE 10 MEQ/100ML IV SOLN: 10.0000 meq | INTRAVENOUS | Status: DC

## 2012-03-06 NOTE — Procedures (Signed)
Indication: Altered Mental Status  Risks of the procedure were dicussed with the patient including post-LP headache, bleeding, infection, weakness/numbness of legs(radiculopathy), death.  The patient's wife agreed and written consent was obtained.   The patient was prepped and draped, and using sterile technique a 20 gauge quinke spinal needle was inserted in the L4-5 space. On the second pass, initially blood tinged CSF that cleared quickly was obtained. The opening pressure was 11 cm H20. Approximately 7 cc of CSF were obtained and sent for analysis.   The patient tolerated the procedure well.

## 2012-03-06 NOTE — Progress Notes (Signed)
Subjective: Patient is much more awake today.   Exam: Filed Vitals:   03/06/12 0700  BP: 113/66  Pulse: 102  Temp: 100.2 F (37.9 C)  Resp: 15   Gen: In bed, NAD MS: Awake, Alert, answers 1933 to year, correctly identifies family members in room. Able to add 7+% and give # of quarters in $2.75 QM:VHQIO, EOMI Motor: Moves all extremities well Sensory:intact to LT  RPR NR, TSH normal  Impression: 78 yo M with altered mental status of unclear etiology. His improvement is encouraging, but he still does have some confusion. Differential remains broad including encephalitis, paraneoplastic syndrome, ideopathic reaction to chemotherapy. Delirium secondary to an underlying dementia may be a consideration, btu there is not a clear history of dementia.    Recommendations: 1) Lp today to assess for infectious causes 2) continue acyclovir until HSV by PCR returns.  3) further recommendations pending CSF results.   Ritta Slot, MD [facility] [phone]  If 7pm- 7am, please page neurology on call at [phone].

## 2012-03-06 NOTE — Progress Notes (Addendum)
[facility] Progress Note [place] TEAM 1 - Stepdown/ICU TEAM   Craig Henderson JXB:[id number] DOB: 12/11/1929 DOA: 03/04/2012 PCP: [name], MD  Brief narrative: 82/M with PMH of Prostate CA treated with XRT 8/13, DM, HTN was in his usual state of health 2-3 weeks ago. His family has noticed periods of disorientation over the last 2-3 weeks, he would say things that didn't make sense like thinking he was in [place], talking about breakfast at dinner time etc.  The day prior to his admit he went to visit his wife who is temporarily staying with her parents late at night which is very late for him as he is usually in bed by then.  The morning of his admit his wife got a call from the police saying that he was found confused in his car outside a church.  He was brought to ED by EMS, and was found to be hyperglycemic with CBG of 500 without an anion gap. His Blood glucose improved with Insulin gtt, but he remained agitated and confused.  Family denied Fevers, chills, N/V/Diarrhea, urinary symptoms, cough, congestion, SOB, or recent medication changes.  Assessment/Plan:  Altered mental status  CT scan reveals diffuse cortical atrophy but no acute abnormality is noted - MRI is of poor quality due to patient motion but no acute insult/mass is appreciable - urinalysis was unrevealing at admission - urine drug screen, serum calcium, and ammonia levels are all unrevealing - B12, folate, cortisol, and TSH are normal - RPR is non-reactive - Neurology has initiated acyclovir for possible HSV encephalitis and is considering an LP today - on review of his home med bottles, I find that he is prescribed decadron "for joint pain" but pill counts suggest that he has not taken it in quite a while  Prostate cancer Neither CT scan or MRI revealed evidence of brain masses  Diabetes mellitus - severely uncontrolled CBG much improved from the time of admission - no change in tx plan today - of note, A1c  is markedly elevated at 10.9  Hypertension Blood pressure remains elevated - adjust treatment and follow  Hypercholesterolemia  Hypokalemia Due to poor intake - replace and follow  Mild renal insufficiency likely pre-renal azotemia - increase maintenance fluids and follow  Code Status: Full Family Communication: spoke w/ wife and daughter at bedside Disposition Plan: monitor in SDU 24hrs additional  Consultants: Neurology  Procedures: LP 03/06/2012 - results pending   Antibiotics: Acyclovir 03/04/2012 >>  DVT prophylaxis: Lovenox  HPI/Subjective: The pt is dramatically improved today.  He is alert and conversant.  He knows he is in [place], but does not know the year or situation.  He denies f/c, sob, n/v, abdom pain, or chest pain.     Objective: Blood pressure 113/66, pulse 102, temperature 100.2 F (37.9 C), temperature source Oral, resp. rate 15, height 5\' 8"  (1.727 m), weight 77.7 kg (171 lb 4.8 oz), SpO2 96.00%.  Intake/Output Summary (Last 24 hours) at 03/06/12 0941 Last data filed at 03/06/12 0700  Gross per 24 hour  Intake   1905 ml  Output   1350 ml  Net    555 ml     Exam: General: No acute respiratory distress Lungs: Clear to auscultation bilaterally without wheezes or crackles  Cardiovascular: Regular rate and rhythm without murmur gallop or rub normal S1 and S2 Abdomen: Nontender, nondistended, soft, bowel sounds positive, no rebound, no ascites, no appreciable mass Extremities: No significant cyanosis, clubbing, or edema bilateral lower extremities  NEURO: PERRLA, EOMIB, CN II-XII intact B, 5/5 strength B U&LE  Data Reviewed: Basic Metabolic Panel:  Lab 03/06/12 1610 03/05/12 0837 03/04/12 1658 03/04/12 1349 03/04/12 0528  NA 141 140 -- 143 133*  K 3.3* 3.6 -- 3.2* 4.4  CL 107 103 -- 109 94*  CO2 21 22 -- -- 25  GLUCOSE 140* 224* -- 63* 501*  BUN 16 9 -- 12 16  CREATININE 1.75* 0.97 0.80 0.90 1.02  CALCIUM 9.2 9.2 -- -- 10.2  MG -- -- -- --  --  PHOS -- -- -- -- --   Liver Function Tests:  Lab 03/06/12 0445 03/04/12 0948  AST 17 15  ALT 13 16  ALKPHOS 73 115  BILITOT 0.9 0.3  PROT 6.8 7.2  ALBUMIN 3.4* 3.7    Lab 03/04/12 0528  LIPASE 19  AMYLASE --    Lab 03/04/12 0952  AMMONIA 39   CBC:  Lab 03/06/12 0445 03/05/12 0837 03/04/12 1658 03/04/12 1349 03/04/12 0528  WBC 11.0* 9.8 13.4* -- 11.3*  NEUTROABS -- -- -- -- 10.1*  HGB 11.3* 11.0* 12.0* 10.2* 12.5*  HCT 32.2* 33.1* 33.8* 30.0* 35.1*  MCV 92.0 91.2 89.9 -- 89.3  PLT 188 200 182 -- 191   Cardiac Enzymes:  Lab 03/04/12 1458 03/04/12 0528  CKTOTAL 278* --  CKMB 3.4 --  CKMBINDEX -- --  TROPONINI -- <0.30   CBG:  Lab 03/06/12 0736 03/06/12 0327 03/05/12 2323 03/05/12 1925 03/05/12 1612  GLUCAP 186* 118* 147* 165* 125*    Studies:  Recent x-ray studies have been reviewed in detail by the Attending Physician  Scheduled Meds:  Reviewed in detail by the Attending Physician   Lonia Blood, MD [facility] Office  [phone] Pager [phone]  On-Call/Text Page:      [email or web address]      password [email or web address]  If 7PM-7AM, please contact night-coverage [email or web address] Password [email or web address] 03/06/2012, 9:41 AM   LOS: 2 days

## 2012-03-07 ENCOUNTER — Inpatient Hospital Stay (HOSPITAL_COMMUNITY): Payer: Medicare Other

## 2012-03-07 DIAGNOSIS — N179 Acute kidney failure, unspecified: Secondary | ICD-10-CM | POA: Diagnosis not present

## 2012-03-07 DIAGNOSIS — R338 Other retention of urine: Secondary | ICD-10-CM | POA: Diagnosis not present

## 2012-03-07 LAB — BASIC METABOLIC PANEL
CO2: 18 mEq/L — ABNORMAL LOW (ref 19–32)
Chloride: 108 mEq/L (ref 96–112)
Chloride: 110 mEq/L (ref 96–112)
GFR calc Af Amer: 10 mL/min — ABNORMAL LOW (ref >90)
Potassium: 3.8 mEq/L (ref 3.5–5.1)
Sodium: 140 mEq/L (ref 135–145)
Sodium: 140 mEq/L (ref 135–145)

## 2012-03-07 LAB — GLUCOSE, CAPILLARY
Glucose-Capillary: 247 mg/dL — ABNORMAL HIGH (ref 70–99)
Glucose-Capillary: 205 mg/dL — ABNORMAL HIGH (ref 70–99)
Glucose-Capillary: 113 mg/dL — ABNORMAL HIGH (ref 70–99)

## 2012-03-07 LAB — CBC
HCT: 29.1 % — ABNORMAL LOW (ref 39.0–52.0)
MCV: 91.2 fL (ref 78.0–100.0)
RBC: 3.19 MIL/uL — ABNORMAL LOW (ref 4.22–5.81)
WBC: 10.6 10*3/uL — ABNORMAL HIGH (ref 4.0–10.5)

## 2012-03-07 LAB — HERPES SIMPLEX VIRUS(HSV) DNA BY PCR

## 2012-03-07 MED ORDER — INSULIN ASPART 100 UNIT/ML ~~LOC~~ SOLN
0.0000 [IU] | SUBCUTANEOUS | Status: DC
  Administered 2012-03-08 (×3): 4 [IU] via SUBCUTANEOUS
  Administered 2012-03-08 (×2): 3 [IU] via SUBCUTANEOUS
  Administered 2012-03-08 – 2012-03-09 (×4): 4 [IU] via SUBCUTANEOUS
  Administered 2012-03-09 (×2): 3 [IU] via SUBCUTANEOUS
  Administered 2012-03-10: 15 [IU] via SUBCUTANEOUS
  Administered 2012-03-10 – 2012-03-11 (×7): 4 [IU] via SUBCUTANEOUS
  Administered 2012-03-11: 7 [IU] via SUBCUTANEOUS
  Administered 2012-03-11 – 2012-03-12 (×2): 4 [IU] via SUBCUTANEOUS
  Administered 2012-03-12: 3 [IU] via SUBCUTANEOUS
  Administered 2012-03-12: 11 [IU] via SUBCUTANEOUS
  Administered 2012-03-12 (×2): 7 [IU] via SUBCUTANEOUS
  Administered 2012-03-13: 3 [IU] via SUBCUTANEOUS
  Administered 2012-03-13: 4 [IU] via SUBCUTANEOUS
  Administered 2012-03-13: 3 [IU] via SUBCUTANEOUS
  Administered 2012-03-13 – 2012-03-14 (×3): 4 [IU] via SUBCUTANEOUS
  Administered 2012-03-14 (×3): 3 [IU] via SUBCUTANEOUS
  Administered 2012-03-14: 4 [IU] via SUBCUTANEOUS
  Administered 2012-03-14: 3 [IU] via SUBCUTANEOUS
  Administered 2012-03-14: 11 [IU] via SUBCUTANEOUS
  Administered 2012-03-15 (×2): 4 [IU] via SUBCUTANEOUS
  Administered 2012-03-15: 7 [IU] via SUBCUTANEOUS
  Administered 2012-03-16 (×2): 4 [IU] via SUBCUTANEOUS
  Administered 2012-03-16: 7 [IU] via SUBCUTANEOUS
  Administered 2012-03-16: 4 [IU] via SUBCUTANEOUS
  Administered 2012-03-16: 3 [IU] via SUBCUTANEOUS
  Administered 2012-03-16: 7 [IU] via SUBCUTANEOUS
  Administered 2012-03-17: 3 [IU] via SUBCUTANEOUS
  Administered 2012-03-17: 4 [IU] via SUBCUTANEOUS
  Administered 2012-03-17: 7 [IU] via SUBCUTANEOUS

## 2012-03-07 MED ORDER — QUETIAPINE 12.5 MG HALF TABLET
12.5000 mg | ORAL_TABLET | Freq: Every day | ORAL | Status: DC
  Filled 2012-03-07: qty 1

## 2012-03-07 NOTE — Progress Notes (Signed)
[facility] Progress Note [place] TEAM 1 - Stepdown/ICU TEAM   Craig Henderson:[id number] DOB: 1929/06/05 DOA: 03/04/2012 PCP: [name], MD  Brief narrative: 82/M with PMH of Prostate CA treated with XRT 8/13, DM, HTN was in his usual state of health 2-3 weeks ago. His family has noticed periods of disorientation over the last 2-3 weeks, he would say things that didn't make sense like thinking he was in [place], talking about breakfast at dinner time etc.  The day prior to his admit he went to visit his wife who is temporarily staying with her parents late at night which is very late for him as he is usually in bed by then.  The morning of his admit his wife got a call from the police saying that he was found confused in his car outside a church.  He was brought to ED by EMS, and was found to be hyperglycemic with CBG of 500 without an anion gap. His Blood glucose improved with Insulin gtt, but he remained agitated and confused.  Family denied Fevers, chills, N/V/Diarrhea, urinary symptoms, cough, congestion, SOB, or recent medication changes.  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 encephalitis - LP was accomplished w/ results pending - on review of his home med bottles, I find that he is prescribed decadron "for joint pain" but pill counts suggest that he has not taken it in quite a while - his mental status continues to wax and wane  Acute renal failure / urinary retention Urinary retention is likely the cause - acyclovir could also be contributing - agree w/ holding acyclovir - place foley  (blader scan revealed >700 cc retained urine) - will not obtain renal US at this time due to predicted hydro from post-bladder obstruction - if renal fxn does not improve w/ decompression of bladder, will then obtain bladder US  Prostate cancer Neither CT scan nor MRI revealed evidence of brain masses  Diabetes mellitus - severely uncontrolled CBG much improved from the time of admission - no change in tx plan today - of note, A1c is markedly elevated at 10.9  Hypertension Blood pressure remains elevated - this is likely related to agitation - follow trend w/o change today - avoid overcorrection  Hypercholesterolemia  Hypokalemia Due to poor intake - replaced  Code Status: Full Family Communication: spoke w/ granddaughter at bedside Disposition Plan: monitor in SDU   Consultants: Neurology  Procedures: LP 03/06/2012 - results pending   Antibiotics: Acyclovir 03/04/2012 >>11/29  DVT prophylaxis: Lovenox - resume today   HPI/Subjective: The pt has become more confused again over night.  He is currently sedated post haldol dose as he was combative.     Objective: Blood pressure 151/71, pulse 112, temperature 97.3 F (36.3 C), temperature source Oral, resp. rate 13, height 5\' 8"  (1.727 m), weight 77.7 kg (171 lb 4.8 oz), SpO2 94.00%.  Intake/Output Summary (Last 24 hours) at 03/07/12 0848 Last data filed at 03/07/12 0555  Gross per 24 hour  Intake 3022.67 ml  Output    200 ml  Net 2822.67 ml     Exam: General: No acute respiratory  distress Lungs: Clear to auscultation bilaterally without wheezes or crackles  Cardiovascular: Regular rate and rhythm without murmur gallop or rub normal S1 and S2 Abdomen: Nontender, nondistended, soft, bowel sounds positive, no rebound, no ascites, no appreciable mass Extremities: No significant cyanosis, clubbing, or edema bilateral lower extremities NEURO: PERRLA, EOMIB, CN II-XII intact B  Data Reviewed: Basic Metabolic  Panel:  Lab 03/07/12 0432 03/06/12 0445 03/05/12 0837 03/04/12 1658 03/04/12 1349 03/04/12 0528  NA 140 141 140 -- 143 133*  K 3.9 3.3* 3.6 -- 3.2* 4.4  CL 108 107 103 -- 109 94*  CO2 18* 21 22 -- -- 25  GLUCOSE 217* 140* 224* -- 63* 501*  BUN 35* 16 9 -- 12 16  CREATININE 4.41* 1.75* 0.97 0.80 0.90 --  CALCIUM 8.8 9.2 9.2 -- -- 10.2  MG -- -- -- -- -- --  PHOS -- -- -- -- -- --   Liver Function Tests:  Lab 03/06/12 0445 03/04/12 0948  AST 17 15  ALT 13 16  ALKPHOS 73 115  BILITOT 0.9 0.3  PROT 6.8 7.2  ALBUMIN 3.4* 3.7    Lab 03/04/12 0528  LIPASE 19  AMYLASE --    Lab 03/04/12 0952  AMMONIA 39   CBC:  Lab 03/07/12 0432 03/06/12 0445 03/05/12 0837 03/04/12 1658 03/04/12 1349 03/04/12 0528  WBC 10.6* 11.0* 9.8 13.4* -- 11.3*  NEUTROABS -- -- -- -- -- 10.1*  HGB 10.3* 11.3* 11.0* 12.0* 10.2* --  HCT 29.1* 32.2* 33.1* 33.8* 30.0* --  MCV 91.2 92.0 91.2 89.9 -- 89.3  PLT 161 188 200 182 -- 191   Cardiac Enzymes:  Lab 03/04/12 1458 03/04/12 0528  CKTOTAL 278* --  CKMB 3.4 --  CKMBINDEX -- --  TROPONINI -- <0.30   CBG:  Lab 03/07/12 0813 03/06/12 2315 03/06/12 1712 03/06/12 1231 03/06/12 0736  GLUCAP 247* 152* 203* 172* 186*    Studies:  Recent x-ray studies have been reviewed in detail by the Attending Physician  Scheduled Meds:  Reviewed in detail by the Attending Physician   Lonia Blood, MD [facility] Office  [phone] Pager [phone]  On-Call/Text Page:      [email or web address]      password [email or web address]  If 7PM-7AM, please contact night-coverage [email or web address] Password [email or web address] 03/07/2012, 8:48 AM   LOS: 3 days

## 2012-03-07 NOTE — Procedures (Signed)
History: 78 yo M with altered mental status.   Sedation: None  Background: There is a poorly sustained  posterior dominant rhythm of 9 Hz that is at times seen slightly better on the right, but < 50% difference. There is generalized irregular delta activtity.   Photic stimulation: Physiologic driving is present  EEG Diagnosis: 1) Generalized slow activity  Clinical Interpretation: This abnormal EEG is consistent with a mild nonspecific generalized cerebral dysfunction(encephalopathy). There was no seizure or seizure predisposition recorded on this study.   Ritta Slot, MD [facility] [phone]  If 7pm- 7am, please page neurology on call at [phone].

## 2012-03-07 NOTE — Progress Notes (Signed)
Repeat EEG completed 

## 2012-03-07 NOTE — Progress Notes (Signed)
Dr. Petra Kuba and Dr. Sharon Seller notified of patient's continued confusion and now inability to eat/swallow, and of coughing after drinking liquids.   Concern for aspiration discussed with Dr. Sharon Seller.  Orders received.  Will continue to monitor patient closely.

## 2012-03-07 NOTE — Progress Notes (Signed)
Pt appears more confused; creatine 4.41; urinary output averaged 15cc/hr during night shift; Dr. Claiborne Billings updated; bladder scan obtained; pt has retained per bladder scan; will call for further orders

## 2012-03-07 NOTE — Progress Notes (Signed)
Subjective: Patient has again become confused.   Exam: Filed Vitals:   03/07/12 0444  BP: 146/76  Pulse:   Temp: 98 F (36.7 C)  Resp: 13   Gen: In bed, NAD MS: Awake, not oriented to place or time, difficult to understand, but does answer questions and follow commands.  OZ:HYQMV, EOMI, face symmetric Motor: 5/5 throughtout.  Sensory: responds to stimuli x 4.   Impression: 78 yo M with progressive confusion starting last thursday that is now waxing and waning. His improvement yesterday with subsequent worsening today does raise the possibility that this is delirium. He had a blood glucose of 500 on admission, and I wonder if he may have had an infection causing both a delirium and increased glucose.  His LP does not have a pleocytosis making HSV less likely. I have inquired with our lab when the PCR should be available. His creatinine has increased to > 4 and I am concerned that the acyclovir could be contributing and I have held this for now, however he did get his 6am dose this morning.   MRI was negative, and EEG did not reveal evidence of seizure activity.   Recommendations: 1) Follow up with HSV by PCR 2) have held acyclovir, would not restart unless PCR is positive, with current GFR he would not be due for another dose until 6am tomorrow.  3) repeat EEG today.   Ritta Slot, MD [facility] [phone]  If 7pm- 7am, please page neurology on call at [phone].

## 2012-03-08 ENCOUNTER — Inpatient Hospital Stay (HOSPITAL_COMMUNITY): Payer: Medicare Other

## 2012-03-08 DIAGNOSIS — R338 Other retention of urine: Secondary | ICD-10-CM

## 2012-03-08 DIAGNOSIS — N179 Acute kidney failure, unspecified: Secondary | ICD-10-CM

## 2012-03-08 LAB — URINALYSIS, ROUTINE W REFLEX MICROSCOPIC
Glucose, UA: NEGATIVE mg/dL
Ketones, ur: NEGATIVE mg/dL
Nitrite: NEGATIVE
Protein, ur: 100 mg/dL — AB

## 2012-03-08 LAB — RENAL FUNCTION PANEL
Albumin: 3.1 g/dL — ABNORMAL LOW (ref 3.5–5.2)
BUN: 48 mg/dL — ABNORMAL HIGH (ref 6–23)
CO2: 15 mEq/L — ABNORMAL LOW (ref 19–32)
Calcium: 8.9 mg/dL (ref 8.4–10.5)
Creatinine, Ser: 6.02 mg/dL — ABNORMAL HIGH (ref 0.50–1.35)
GFR calc non Af Amer: 8 mL/min — ABNORMAL LOW (ref >90)

## 2012-03-08 LAB — FOLATE: Folate: >24 ng/mL (ref >5.4)

## 2012-03-08 LAB — GLUCOSE, CAPILLARY
Glucose-Capillary: 184 mg/dL — ABNORMAL HIGH (ref 70–99)
Glucose-Capillary: 152 mg/dL — ABNORMAL HIGH (ref 70–99)

## 2012-03-08 LAB — URINE MICROSCOPIC-ADD ON

## 2012-03-08 LAB — PSA: PSA: 0.52 ng/mL (ref <=4.00)

## 2012-03-08 MED ORDER — CEFTRIAXONE SODIUM 1 G IJ SOLR
1.0000 g | INTRAMUSCULAR | Status: DC
  Administered 2012-03-08 – 2012-03-16 (×9): 1 g via INTRAVENOUS
  Filled 2012-03-08 (×11): qty 10

## 2012-03-08 MED ORDER — BIOTENE DRY MOUTH MT LIQD
15.0000 mL | Freq: Two times a day (BID) | OROMUCOSAL | Status: DC
  Administered 2012-03-09 – 2012-03-17 (×17): 15 mL via OROMUCOSAL

## 2012-03-08 MED ORDER — HEPARIN SODIUM (PORCINE) 5000 UNIT/ML IJ SOLN
5000.0000 [IU] | Freq: Three times a day (TID) | INTRAMUSCULAR | Status: DC
  Administered 2012-03-08 – 2012-03-17 (×28): 5000 [IU] via SUBCUTANEOUS
  Filled 2012-03-08 (×30): qty 1

## 2012-03-08 MED ORDER — SODIUM BICARBONATE 8.4 % IV SOLN: INTRAVENOUS | Status: DC

## 2012-03-08 MED ORDER — SODIUM BICARBONATE 8.4 % IV SOLN
INTRAVENOUS | Status: DC
  Administered 2012-03-08 – 2012-03-09 (×3): via INTRAVENOUS
  Filled 2012-03-08 (×8): qty 100

## 2012-03-08 MED ORDER — LORAZEPAM 2 MG/ML IJ SOLN
1.0000 mg | INTRAMUSCULAR | Status: DC | PRN
  Administered 2012-03-08 – 2012-03-10 (×2): 2 mg via INTRAVENOUS
  Filled 2012-03-08 (×2): qty 1

## 2012-03-08 MED ORDER — CLONIDINE HCL 0.1 MG/24HR TD PTWK
0.1000 mg | MEDICATED_PATCH | TRANSDERMAL | Status: DC
  Administered 2012-03-08 – 2012-03-15 (×2): 0.1 mg via TRANSDERMAL
  Filled 2012-03-08 (×2): qty 1

## 2012-03-08 MED ORDER — DEXAMETHASONE SODIUM PHOSPHATE 4 MG/ML IJ SOLN: 4.0000 mg | Freq: Once | INTRAMUSCULAR | Status: DC

## 2012-03-08 NOTE — Progress Notes (Signed)
Subjective: Contineus to be confused this mroning. Per wife, was mildly agitated.   Exam: Filed Vitals:   03/08/12 1551  BP:   Pulse:   Temp: 97.3 F (36.3 C)  Resp:    Gen: In bed, NAD MS: Awake, does not follow commands and speech is unintelligible.  EA:VWUJW, EOMI Motor: Moves all extremities well Sensory:responds to nox stim x 4  Impression: 78 yo M with initial epsiode of altered mental statsu that was clearing on 11/28, but has again worsened. It is possible that he had an infection causing delirium(wife reports cough prior to AMS). He was clearing, and then in the setting of UTI and renal failure he has become more encephalopathic. Further workup could include a broad differential given his pancreatic cancer, but thus far MRI did not reveal stroke, LP negative for infection, EEG just appeared slow. I suspect that this represents a metabolic encephalopathy and will treat his underlying issues. If his mental status does not improve, then further workup may be needed.   Recommendations: 1)Treat UTI, hypernatremia, and renal failure 2) Will follow with you.   Ritta Slot, MD [facility] [phone]  If 7pm- 7am, please page neurology on call at [phone].

## 2012-03-08 NOTE — Progress Notes (Signed)
[facility] Progress Note [place] TEAM 1 - Stepdown/ICU TEAM   BEECHER FURIO YQM:[id number] DOB: 05-07-1929 DOA: 03/04/2012 PCP: [name], MD  Brief narrative: 82/M with PMH of Prostate CA treated with XRT 8/13, DM, HTN was in his usual state of health 2-3 weeks ago. His family has noticed periods of disorientation over the last 2-3 weeks, he would say things that didn't make sense like thinking he was in [place], talking about breakfast at dinner time etc.  The day prior to his admit he went to visit his wife who is temporarily staying with her parents late at night which is very late for him as he is usually in bed by then.  The morning of his admit his wife got a call from the police saying that he was found confused in his car outside a church.  He was brought to ED by EMS, and was found to be hyperglycemic with CBG of 500 without an anion gap. His Blood glucose improved with Insulin gtt, but he remained agitated and confused.  Family denied Fevers, chills, N/V/Diarrhea, urinary symptoms, cough, congestion, SOB, or recent medication changes.  Assessment/Plan:  Altered mental status  CT scan revealed diffuse cortical atrophy but no acute abnormality - MRI was of poor quality due to patient motion but no acute insult/mass is appreciable - urinalysis was unrevealing at admission - urine drug screen, serum calcium, and ammonia levels are all unrevealing - B12, folate, cortisol, and TSH are normal - RPR is non-reactive - Neurology initially initiated acyclovir for possible HSV encephalitis, but LP HSV studies now negative for HSV - on review of his home med bottles, I find that he is prescribed decadron "for joint pain" but pill counts suggest that he has not taken it in quite a while - his mental status appears to be deteriorating - I feel we have no choice but to add additional sedatives for his safety  Acute renal failure / urinary retention Urinary retention is likely the cause  - acyclovir could also be contributing - acyclovir has been stopped - placed foley (blader scan revealed >700 cc retained urine) - obtain renal US today - signif post-obstructive diuresis is taking place  Metabolic acidosis  Due to above - add bicarb to IVF and follow   Prostate cancer Neither CT scan nor MRI revealed evidence of brain masses  Diabetes mellitus - severely uncontrolled CBG much improved from the time of admission - no change in tx plan today - of note, A1c is markedly elevated at 10.9  Hypertension Blood pressure remains elevated - this is likely related to agitation - add clonidine patch and follow  Hypercholesterolemia  Hypokalemia Due to poor intake - replaced - watch for recurrence w/ IV bicarb  Code Status: Full Family Communication: spoke w/ wife at bedside  Disposition Plan: monitor in SDU   Consultants: Neurology  Procedures: LP 03/06/2012 - no clear evidence of infection/abnorm  Antibiotics: Acyclovir 03/04/2012 >>11/29  DVT prophylaxis: Sub Q heparin  HPI/Subjective: The pt remains very confused.  He is striking out at the examiner.  He is thrashing about in the bed.  He is mumbling incoherently.  His wife is at the bedside.     Objective: Blood pressure 178/78, pulse 108, temperature 99 F (37.2 C), temperature source Oral, resp. rate 23, height 5\' 8"  (1.727 m), weight 77.7 kg (171 lb 4.8 oz), SpO2 98.00%.  Intake/Output Summary (Last 24 hours) at 03/08/12 1108 Last data filed at 03/08/12 1100  Gross per 24 hour  Intake 2561.25 ml  Output   4245 ml  Net -1683.75 ml     Exam: General: No acute respiratory distress - markedly agitated and confused  Lungs: Clear to auscultation bilaterally without wheezes or crackles  Cardiovascular: tachycardic, regular rhythm, no appreciable M Abdomen: Nontender, nondistended, soft, bowel sounds positive, no rebound, no ascites, no appreciable mass Extremities: No significant cyanosis, clubbing, or  edema bilateral lower extremities NEURO: moving all 4 ext spontaneously, will not cooperate w/ pupil exam, striking out at the examiner  Data Reviewed: Basic Metabolic Panel:  Lab 03/08/12 1610 03/07/12 1633 03/07/12 0432 03/06/12 0445 03/05/12 0837  NA 148* 140 140 141 140  K 4.1 3.8 3.9 3.3* 3.6  CL 115* 110 108 107 103  CO2 15* 17* 18* 21 22  GLUCOSE 158* 122* 217* 140* 224*  BUN 48* 41* 35* 16 9  CREATININE 6.02* 5.37* 4.41* 1.75* 0.97  CALCIUM 8.9 8.9 8.8 9.2 9.2  MG -- -- -- -- --  PHOS 6.0* -- -- -- --   Liver Function Tests:  Lab 03/08/12 0830 03/06/12 0445 03/04/12 0948  AST -- 17 15  ALT -- 13 16  ALKPHOS -- 73 115  BILITOT -- 0.9 0.3  PROT -- 6.8 7.2  ALBUMIN 3.1* 3.4* 3.7    Lab 03/04/12 0528  LIPASE 19  AMYLASE --    Lab 03/04/12 0952  AMMONIA 39   CBC:  Lab 03/07/12 0432 03/06/12 0445 03/05/12 0837 03/04/12 1658 03/04/12 1349 03/04/12 0528  WBC 10.6* 11.0* 9.8 13.4* -- 11.3*  NEUTROABS -- -- -- -- -- 10.1*  HGB 10.3* 11.3* 11.0* 12.0* 10.2* --  HCT 29.1* 32.2* 33.1* 33.8* 30.0* --  MCV 91.2 92.0 91.2 89.9 -- 89.3  PLT 161 188 200 182 -- 191   Cardiac Enzymes:  Lab 03/04/12 1458 03/04/12 0528  CKTOTAL 278* --  CKMB 3.4 --  CKMBINDEX -- --  TROPONINI -- <0.30   CBG:  Lab 03/08/12 0837 03/08/12 0347 03/07/12 2313 03/07/12 1942 03/07/12 1600  GLUCAP 155* 184* 149* 113* 118*    Studies:  Recent x-ray studies have been reviewed in detail by the Attending Physician  Scheduled Meds:  Reviewed in detail by the Attending Physician   Lonia Blood, MD [facility] Office  [phone] Pager [phone]  On-Call/Text Page:      [email or web address]      password [email or web address]  If 7PM-7AM, please contact night-coverage [email or web address] Password [email or web address] 03/08/2012, 11:08 AM   LOS: 4 days

## 2012-03-09 LAB — URINE CULTURE
Colony Count: NO GROWTH
Culture: NO GROWTH

## 2012-03-09 LAB — CBC
HCT: 26.7 % — ABNORMAL LOW (ref 39.0–52.0)
MCH: 31.7 pg (ref 26.0–34.0)
MCV: 91.1 fL (ref 78.0–100.0)
Platelets: 166 10*3/uL (ref 150–400)
RDW: 12.7 % (ref 11.5–15.5)
WBC: 9.4 10*3/uL (ref 4.0–10.5)

## 2012-03-09 LAB — BASIC METABOLIC PANEL
BUN: 51 mg/dL — ABNORMAL HIGH (ref 6–23)
CO2: 27 mEq/L (ref 19–32)
Calcium: 8.8 mg/dL (ref 8.4–10.5)
Chloride: 112 mEq/L (ref 96–112)
Creatinine, Ser: 5.52 mg/dL — ABNORMAL HIGH (ref 0.50–1.35)
Glucose, Bld: 150 mg/dL — ABNORMAL HIGH (ref 70–99)

## 2012-03-09 LAB — GLUCOSE, CAPILLARY
Glucose-Capillary: 100 mg/dL — ABNORMAL HIGH (ref 70–99)
Glucose-Capillary: 131 mg/dL — ABNORMAL HIGH (ref 70–99)
Glucose-Capillary: 144 mg/dL — ABNORMAL HIGH (ref 70–99)
Glucose-Capillary: 163 mg/dL — ABNORMAL HIGH (ref 70–99)
Glucose-Capillary: 166 mg/dL — ABNORMAL HIGH (ref 70–99)

## 2012-03-09 LAB — CSF CULTURE W GRAM STAIN

## 2012-03-09 MED ORDER — SODIUM BICARBONATE 8.4 % IV SOLN
INTRAVENOUS | Status: DC
  Administered 2012-03-09 (×2): via INTRAVENOUS
  Filled 2012-03-09 (×9): qty 50

## 2012-03-09 NOTE — Progress Notes (Signed)
Subjective: Mildly improved, though still very confused. Tells me that "you don't need to bother with me, I am keeping you from other problems"  Exam: Filed Vitals:   03/09/12 0807  BP:   Pulse:   Temp: 98.5 F (36.9 C)  Resp: 22   Gen: In bed, mildly agitated and restless in bed.  MS: Awake, alert, dysarthric but intelligible. Follows commands briskly. Gives correct year and location(both city and hospital). He then states that he is "going to [employer]".  XB:MWUXL, fixates and tracks across midline in both directions.  Motor: moves all extremities well Sensory:repsonds to touch in all 4 ext.   Impression: 78 yo M with progressive confusion on admission with likely URI prior to admission. He was clearing, but then developed UTI and renal failure, likely due to a combination of urinary retention and acyclovir. His BUN remains elevated.   I suspect that he had a delirium secondary to his infection on admission and then was clearing when he developed uti+renal failure and is now encephalopathic secondary to this. If these problems resolve and he remains encephalopathic, then further workup may be necessary, but I would hold off at this time. LP negative as was MRI(though limited study).   Recommendations: 1) treat AKI, hypernatremia, and UTI per internal medicine 2) Will continue to follow.  Ritta Slot, MD [facility] [phone]  If 7pm- 7am, please page neurology on call at [phone].

## 2012-03-09 NOTE — Progress Notes (Signed)
[facility] Progress Note [place] TEAM 1 - Stepdown/ICU TEAM   Craig Henderson YQM:[id number] DOB: [birth year] DOA: 03/04/2012 PCP: [name], MD  Brief narrative: 82/M with PMH of Prostate CA treated with XRT 8/13, DM, HTN was in his usual state of health 2-3 weeks ago. His family has noticed periods of disorientation over the last 2-3 weeks, he would say things that didn't make sense like thinking he was in [place], talking about breakfast at dinner time etc.  The day prior to his admit he went to visit his wife who is temporarily staying with her parents late at night which is very late for him as he is usually in bed by then.  The morning of his admit his wife got a call from the police saying that he was found confused in his car outside a church.  He was brought to ED by EMS, and was found to be hyperglycemic with CBG of 500 without an anion gap. His Blood glucose improved with Insulin gtt, but he remained agitated and confused.  Family denied Fevers, chills, N/V/Diarrhea, urinary symptoms, cough, congestion, SOB, or recent medication changes.  Assessment/Plan:  Altered mental status  CT scan revealed diffuse cortical atrophy but no acute abnormality - MRI was of poor quality due to patient motion but no acute insult/mass is appreciable - urinalysis was unrevealing at admission - urine drug screen, serum calcium, and ammonia levels are all unrevealing - B12, folate, cortisol, and TSH are normal - RPR is non-reactive - Neurology initially initiated acyclovir for possible HSV encephalitis, but LP HSV studies now negative for HSV - on review of his home med bottles, I find that he is prescribed decadron "for joint pain" but pill counts suggest that he has not taken it in quite a while - his mental status appears to be deteriorating - I feel we have no choice but to add additional sedatives for his safety - I agree that his presenting idiopathic delirium has likely cleared, with a new  delirium having developed due to his ARF and UTI - cont to tx same and follow  Acute renal failure / urinary retention Urinary retention is likely the cause - acyclovir could also be contributing - acyclovir has been stopped - placed foley (blader scan revealed >700 cc retained urine) - renal US w/o acute findings - signif post-obstructive diuresis continues - crt dose appear to have plateaud - cont to follow trend   Metabolic acidosis  Due to above - will decrease bicarb supplement and follow trend  Hypernatremia Most likely due to hypovolemia w/ no oral intake and w/ post-obst diuresis - cont IVF and decrease tonicity further   UTI/pyelonephritis Cont empiric abx and follow cx data  Prostate cancer Neither CT scan nor MRI revealed evidence of brain masses  Diabetes mellitus - severely uncontrolled CBG much improved from the time of admission - no change in tx plan today - of note, A1c markedly elevated at 10.9  Hypertension Blood pressure remains elevated - this is likely related to agitation - no change today   Hypercholesterolemia  Hypokalemia Due to poor intake - replace - watch for recurrence w/ IV bicarb  Code Status: Full Family Communication: spoke w/ daughter at bedside  Disposition Plan: monitor in SDU   Consultants: Neurology  Procedures: LP 03/06/2012 - no clear evidence of infection/abnorm  Antibiotics: Acyclovir 03/04/2012 >>11/29  DVT prophylaxis: Sub Q heparin  HPI/Subjective: The pt is somewhat more alert, though he remains very confused.  He is pulling at his IV and trying to get out of bed.  He is not able to provide a reliable hx.    Objective: Blood pressure 154/65, pulse 72, temperature 98 F (36.7 C), temperature source Oral, resp. rate 18, height 5\' 8"  (1.727 m), weight 77.7 kg (171 lb 4.8 oz), SpO2 100.00%.  Intake/Output Summary (Last 24 hours) at 03/09/12 1150 Last data filed at 03/09/12 0700  Gross per 24 hour  Intake 7256.25 ml    Output      1 ml  Net 7255.25 ml     Exam: General: No acute respiratory distress - agitated and confused  Lungs: Clear to auscultation bilaterally without wheezes or crackles  Cardiovascular: tachycardic, regular rhythm, no appreciable M Abdomen: Nontender, nondistended, soft, bowel sounds positive, no rebound, no ascites, no appreciable mass Extremities: No significant cyanosis, clubbing, or edema bilateral lower extremities NEURO: moving all 4 ext spontaneously  Data Reviewed: Basic Metabolic Panel:  Lab 03/09/12 9562 03/08/12 0830 03/07/12 1633 03/07/12 0432 03/06/12 0445  NA 151* 148* 140 140 141  K 3.5 4.1 3.8 3.9 3.3*  CL 112 115* 110 108 107  CO2 27 15* 17* 18* 21  GLUCOSE 150* 158* 122* 217* 140*  BUN 51* 48* 41* 35* 16  CREATININE 5.52* 6.02* 5.37* 4.41* 1.75*  CALCIUM 8.8 8.9 8.9 8.8 9.2  MG -- -- -- -- --  PHOS -- 6.0* -- -- --   Liver Function Tests:  Lab 03/08/12 0830 03/06/12 0445 03/04/12 0948  AST -- 17 15  ALT -- 13 16  ALKPHOS -- 73 115  BILITOT -- 0.9 0.3  PROT -- 6.8 7.2  ALBUMIN 3.1* 3.4* 3.7    Lab 03/04/12 0528  LIPASE 19  AMYLASE --    Lab 03/04/12 0952  AMMONIA 39   CBC:  Lab 03/09/12 0520 03/07/12 0432 03/06/12 0445 03/05/12 0837 03/04/12 1658 03/04/12 0528  WBC 9.4 10.6* 11.0* 9.8 13.4* --  NEUTROABS -- -- -- -- -- 10.1*  HGB 9.3* 10.3* 11.3* 11.0* 12.0* --  HCT 26.7* 29.1* 32.2* 33.1* 33.8* --  MCV 91.1 91.2 92.0 91.2 89.9 --  PLT 166 161 188 200 182 --   Cardiac Enzymes:  Lab 03/04/12 1458 03/04/12 0528  CKTOTAL 278* --  CKMB 3.4 --  CKMBINDEX -- --  TROPONINI -- <0.30   CBG:  Lab 03/09/12 0807 03/09/12 0356 03/09/12 0045 03/08/12 2038 03/08/12 1553  GLUCAP 163* 144* 166* 163* 148*    Studies:  Recent x-ray studies have been reviewed in detail by the Attending Physician  Scheduled Meds:  Reviewed in detail by the Attending Physician   Lonia Blood, MD [facility] Office  [phone] Pager  [phone]  On-Call/Text Page:      [email or web address]      password [email or web address]  If 7PM-7AM, please contact night-coverage [email or web address] Password [email or web address] 03/09/2012, 11:50 AM   LOS: 5 days

## 2012-03-10 LAB — COMPREHENSIVE METABOLIC PANEL
ALT: 9 U/L (ref 0–53)
AST: 13 U/L (ref 0–37)
Albumin: 2.7 g/dL — ABNORMAL LOW (ref 3.5–5.2)
Alkaline Phosphatase: 58 U/L (ref 39–117)
BUN: 48 mg/dL — ABNORMAL HIGH (ref 6–23)
CO2: 33 mEq/L — ABNORMAL HIGH (ref 19–32)
Calcium: 8.4 mg/dL (ref 8.4–10.5)
Chloride: 106 mEq/L (ref 96–112)
Creatinine, Ser: 4.36 mg/dL — ABNORMAL HIGH (ref 0.50–1.35)
GFR calc Af Amer: 13 mL/min — ABNORMAL LOW (ref >90)
GFR calc non Af Amer: 12 mL/min — ABNORMAL LOW (ref >90)
Glucose, Bld: 193 mg/dL — ABNORMAL HIGH (ref 70–99)
Potassium: 3 mEq/L — ABNORMAL LOW (ref 3.5–5.1)
Sodium: 150 mEq/L — ABNORMAL HIGH (ref 135–145)
Total Bilirubin: 0.8 mg/dL (ref 0.3–1.2)
Total Protein: 6.1 g/dL (ref 6.0–8.3)

## 2012-03-10 LAB — GLUCOSE, CAPILLARY
Glucose-Capillary: 105 mg/dL — ABNORMAL HIGH (ref 70–99)
Glucose-Capillary: 177 mg/dL — ABNORMAL HIGH (ref 70–99)
Glucose-Capillary: 197 mg/dL — ABNORMAL HIGH (ref 70–99)
Glucose-Capillary: 301 mg/dL — ABNORMAL HIGH (ref 70–99)

## 2012-03-10 LAB — CBC
Hemoglobin: 8.5 g/dL — ABNORMAL LOW (ref 13.0–17.0)
MCV: 94.3 fL (ref 78.0–100.0)
Platelets: 134 10*3/uL — ABNORMAL LOW (ref 150–400)
RBC: 2.64 MIL/uL — ABNORMAL LOW (ref 4.22–5.81)
WBC: 6.3 10*3/uL (ref 4.0–10.5)

## 2012-03-10 LAB — VDRL, CSF: VDRL Quant, CSF: NONREACTIVE

## 2012-03-10 MED ORDER — POTASSIUM CHLORIDE 10 MEQ/100ML IV SOLN
10.0000 meq | INTRAVENOUS | Status: AC
  Administered 2012-03-10 (×3): 10 meq via INTRAVENOUS
  Filled 2012-03-10: qty 200
  Filled 2012-03-10: qty 100

## 2012-03-10 MED ORDER — ENSURE PUDDING PO PUDG
1.0000 | Freq: Three times a day (TID) | ORAL | Status: DC
  Administered 2012-03-10 – 2012-03-17 (×19): 1 via ORAL

## 2012-03-10 MED ORDER — PROSIGHT PO TABS
1.0000 | ORAL_TABLET | Freq: Every day | ORAL | Status: DC
  Administered 2012-03-10 – 2012-03-17 (×8): 1 via ORAL
  Filled 2012-03-10 (×8): qty 1

## 2012-03-10 MED ORDER — OCUVITE-LUTEIN PO CAPS
1.0000 | ORAL_CAPSULE | Freq: Every day | ORAL | Status: DC
  Filled 2012-03-10: qty 1

## 2012-03-10 MED ORDER — ACETAMINOPHEN 325 MG PO TABS
650.0000 mg | ORAL_TABLET | Freq: Four times a day (QID) | ORAL | Status: DC | PRN
  Administered 2012-03-14 – 2012-03-17 (×4): 650 mg via ORAL
  Filled 2012-03-10 (×4): qty 2

## 2012-03-10 MED ORDER — POTASSIUM CL IN DEXTROSE 5% 20 MEQ/L IV SOLN
20.0000 meq | INTRAVENOUS | Status: DC
  Administered 2012-03-10 – 2012-03-11 (×2): 20 meq via INTRAVENOUS
  Filled 2012-03-10 (×4): qty 1000

## 2012-03-10 NOTE — Progress Notes (Signed)
Nutrition Follow-up  Intervention:   1. Recommend swallow evaluation for possible diet advancement  2. Recommend obtaining new weight to identify further weight loss.  3. RD will continue to follow     Assessment:   Continues to be NPO with AMS. Diet was advanced for about one day, then back to NPO, no meals documented, about 6 days with out meals.   Per notes presenting idiopathic delirium has likely clears with new delirium from ARF and UTI. Has required some sedation as pt has been agitated and pulling at lines.   RN reports pt likely would not tolerate a NG tube. Recommend swallow evaluation for possible diet advancement If unable to take PO's will need alternate means of nutrition.   Diet Order:  NPO  Meds: Scheduled Meds:   . antiseptic oral rinse  15 mL Mouth Rinse BID  . cefTRIAXone (ROCEPHIN)  IV  1 g Intravenous Q24H  . cloNIDine  0.1 mg Transdermal Weekly  . heparin subcutaneous  5,000 Units Subcutaneous Q8H  . insulin aspart  0-20 Units Subcutaneous Q4H  . insulin glargine  15 Units Subcutaneous Daily  . potassium chloride  10 mEq Intravenous Q1 Hr x 3   Continuous Infusions:   .  sodium bicarbonate infusion 1000 mL 150 mL/hr at 03/10/12 0600   PRN Meds:.acetaminophen, haloperidol lactate, labetalol, LORazepam, ondansetron (ZOFRAN) IV   CMP     Component Value Date/Time   NA 150* 03/10/2012 0500   K 3.0* 03/10/2012 0500   CL 106 03/10/2012 0500   CO2 33* 03/10/2012 0500   GLUCOSE 193* 03/10/2012 0500   BUN 48* 03/10/2012 0500   CREATININE 4.36* 03/10/2012 0500   CALCIUM 8.4 03/10/2012 0500   PROT 6.1 03/10/2012 0500   ALBUMIN 2.7* 03/10/2012 0500   AST 13 03/10/2012 0500   ALT 9 03/10/2012 0500   ALKPHOS 58 03/10/2012 0500   BILITOT 0.8 03/10/2012 0500   GFRNONAA 12* 03/10/2012 0500   GFRAA 13* 03/10/2012 0500    CBG (last 3)   Basename 03/10/12 0740 03/10/12 0405 03/10/12 0022  GLUCAP 155* 197* 177*     Intake/Output Summary (Last 24 hours) at 03/10/12  1049 Last data filed at 03/10/12 0700  Gross per 24 hour  Intake   3290 ml  Output   2750 ml  Net    540 ml    Weight Status:  No new weights   Re-estimated needs:  1750-1900 kcal, 75-90 gm protein   Nutrition Dx:  Inadequate oral intake r/t AMS as evidenced by NPO.   Goal:  Diet advance to meet nutrition needs. Unmet  Monitor:  Diet advance, swallow evaluation, weight, labs   Clarene Duke RD, LDN Pager [phone] After Hours pager [phone]

## 2012-03-10 NOTE — Progress Notes (Addendum)
[facility] Progress Note [place] TEAM 1 - Stepdown/ICU TEAM   Craig Henderson ZOX:[id number] DOB: 1929-12-26 DOA: 03/04/2012 PCP: [name], MD  Brief narrative: 82/M with PMH of Prostate CA treated with XRT 8/13, DM, HTN was in his usual state of health 2-3 weeks ago. His family has noticed periods of disorientation over the last 2-3 weeks, he would say things that didn't make sense like thinking he was in [place], talking about breakfast at dinner time etc.  The day prior to his admit he went to visit his wife who is temporarily staying with her parents late at night which is very late for him as he is usually in bed by then.  The morning of his admit his wife got a call from the police saying that he was found confused in his car outside [place].  He was brought to ED by EMS, and was found to be hyperglycemic with CBG of 500 without an anion gap. His Blood glucose improved with Insulin gtt, but he remained agitated and confused.  Family denied Fevers, chills, N/V/Diarrhea, urinary symptoms, cough, congestion, SOB, or recent medication changes.  Assessment/Plan:  Altered mental status  CT scan revealed diffuse cortical atrophy but no acute abnormality - MRI was of poor quality due to patient motion but no acute insult/mass is appreciable - urinalysis was unrevealing at admission - urine drug screen, serum calcium, and ammonia levels were all unrevealing - B12, folate, cortisol, and TSH were normal - RPR was non-reactive - Neurology initially initiated acyclovir for possible HSV encephalitis, but LP HSV studies negative for HSV - on review of his home med bottles, I find that he is prescribed decadron "for joint pain" but pill counts suggest that he has not taken it in quite a while - I agree that his presenting idiopathic delirium has likely cleared, with a new delirium having developed due to his ARF and UTI - pt is much improved today (conversant and only moderately confused - not  agitated or combative)  Acute renal failure / urinary retention Urinary retention is likely the cause - acyclovir could also be contributing - acyclovir has been stopped - placed foley (blader scan revealed >700 cc retained urine) - renal US w/o acute findings - signif post-obstructive diuresis continues - crt slowly improving - cont to follow trend - keep IVF infusing until auto-diuresis slows  Metabolic acidosis  Due to above - resolved - d/c bicarb in IVF today - follow   Hypernatremia due to hypovolemia w/ no oral intake and w/ post-obst diuresis - cont IVF but change to D5W today at low rate  UTI/pyelonephritis Unfortunately, urine culture did not identify the offending pathogen - will complete 10 days of empiric tx (given complex hx of prostate CA)  Prostate cancer Neither CT scan nor MRI revealed evidence of brain masses  Diabetes mellitus - severely uncontrolled CBG much improved from the time of admission - no change in tx plan today - of note, A1c markedly elevated at 10.9  Hypertension Blood pressure improved today - follow w/o change  Hypercholesterolemia  Hypokalemia Due to poor intake + IV bicarb - d/c bicarb - replace  Normocytic anemia ?due to poor nutrition - no clear evidence of blood loss - follow trend  Nutrition Will resume diet today - if evidence of aspiration noted, will ask for SLP consult   Code Status: Full Family Communication: spoke w/ wife at bedside  Disposition Plan: stable for transfer to Neuro med bed  Consultants: Neurology  Procedures: LP 03/06/2012 - no clear evidence of infection/abnorm  Antibiotics: Acyclovir 03/04/2012 >>11/29 Rocephin 11/30 >>  DVT prophylaxis: Sub Q heparin  HPI/Subjective: Pt is much more alert today.  He is calm and interactive.  He is mildly confused, but denies complaints.  He is very hungry and thirsty.     Objective: Blood pressure 133/62, pulse 63, temperature 98.3 F (36.8 C), temperature  source Oral, resp. rate 18, height 5\' 8"  (1.727 m), weight 77.7 kg (171 lb 4.8 oz), SpO2 99.00%.  Intake/Output Summary (Last 24 hours) at 03/10/12 1124 Last data filed at 03/10/12 0700  Gross per 24 hour  Intake   3290 ml  Output   2750 ml  Net    540 ml     Exam: General: No acute respiratory distress - much more alert Lungs: Clear to auscultation bilaterally without wheezes or crackles  Cardiovascular: regular rate and regular rhythm, no appreciable M Abdomen: Nontender, nondistended, soft, bowel sounds positive, no rebound, no ascites, no appreciable mass Extremities: No significant cyanosis, clubbing, or edema bilateral lower extremities NEURO: moving all 4 ext spontaneously, CNII-XII intact B, alert but not fully oriented  Data Reviewed: Basic Metabolic Panel:  Lab 03/10/12 4696 03/09/12 0520 03/08/12 0830 03/07/12 1633 03/07/12 0432  NA 150* 151* 148* 140 140  K 3.0* 3.5 4.1 3.8 3.9  CL 106 112 115* 110 108  CO2 33* 27 15* 17* 18*  GLUCOSE 193* 150* 158* 122* 217*  BUN 48* 51* 48* 41* 35*  CREATININE 4.36* 5.52* 6.02* 5.37* 4.41*  CALCIUM 8.4 8.8 8.9 8.9 8.8  MG -- -- -- -- --  PHOS -- -- 6.0* -- --   Liver Function Tests:  Lab 03/10/12 0500 03/08/12 0830 03/06/12 0445 03/04/12 0948  AST 13 -- 17 15  ALT 9 -- 13 16  ALKPHOS 58 -- 73 115  BILITOT 0.8 -- 0.9 0.3  PROT 6.1 -- 6.8 7.2  ALBUMIN 2.7* 3.1* 3.4* 3.7    Lab 03/04/12 0528  LIPASE 19  AMYLASE --    Lab 03/04/12 0952  AMMONIA 39   CBC:  Lab 03/10/12 0500 03/09/12 0520 03/07/12 0432 03/06/12 0445 03/05/12 0837 03/04/12 0528  WBC 6.3 9.4 10.6* 11.0* 9.8 --  NEUTROABS -- -- -- -- -- 10.1*  HGB 8.5* 9.3* 10.3* 11.3* 11.0* --  HCT 24.9* 26.7* 29.1* 32.2* 33.1* --  MCV 94.3 91.1 91.2 92.0 91.2 --  PLT 134* 166 161 188 200 --   Cardiac Enzymes:  Lab 03/04/12 1458 03/04/12 0528  CKTOTAL 278* --  CKMB 3.4 --  CKMBINDEX -- --  TROPONINI -- <0.30   CBG:  Lab 03/10/12 0740 03/10/12 0405  03/10/12 0022 03/09/12 1944 03/09/12 1642  GLUCAP 155* 197* 177* 100* 159*    Studies:  Recent x-ray studies have been reviewed in detail by the Attending Physician  Scheduled Meds:  Reviewed in detail by the Attending Physician   Lonia Blood, MD [facility] Office  [phone] Pager [phone]  On-Call/Text Page:      [email or web address]      password [email or web address]  If 7PM-7AM, please contact night-coverage [email or web address] Password [email or web address] 03/10/2012, 11:24 AM   LOS: 6 days

## 2012-03-10 NOTE — Progress Notes (Addendum)
Subjective: Continues to improve  Exam: Filed Vitals:   03/10/12 2038  BP: 121/56  Pulse: 76  Temp: 98 F (36.7 C)  Resp: 17   Gen: In bed, not agitated.  MS: Awake, alert, dysarthric but intelligible. Not oriented, but his conversation is more coherent than yesterday.  ZO:XWRUE, fixates and tracks across midline in both directions.  Motor: moves all extremities well Sensory:repsonds to touch in all 4 ext.   Impression: 78 yo M with progressive confusion on admission with likely URI prior to admission. He was clearing, but then developed UTI and renal failure, likely due to a combination of urinary retention and acyclovir. His BUN remains elevated, but cr is improving.   I suspect that he had a delirium secondary to an infection on admission and then was clearing when he developed uti+renal failure and is now encephalopathic secondary to this. His continued improvement would argue for this explanation rather than a progressive cause.    Recommendations: 1) treat AKI, hypernatremia, and UTI per internal medicine 2) Will continue to follow.  Ritta Slot, MD [facility] [phone]  If 7pm- 7am, please page neurology on call at [phone].

## 2012-03-11 LAB — CBC
MCHC: 34.9 g/dL (ref 30.0–36.0)
MCV: 93.4 fL (ref 78.0–100.0)
Platelets: 160 10*3/uL (ref 150–400)
RDW: 12.8 % (ref 11.5–15.5)
WBC: 6.4 10*3/uL (ref 4.0–10.5)

## 2012-03-11 LAB — GLUCOSE, CAPILLARY
Glucose-Capillary: 190 mg/dL — ABNORMAL HIGH (ref 70–99)
Glucose-Capillary: 180 mg/dL — ABNORMAL HIGH (ref 70–99)

## 2012-03-11 LAB — BASIC METABOLIC PANEL
CO2: 30 mEq/L (ref 19–32)
Calcium: 8.1 mg/dL — ABNORMAL LOW (ref 8.4–10.5)
Creatinine, Ser: 3.44 mg/dL — ABNORMAL HIGH (ref 0.50–1.35)

## 2012-03-11 MED ORDER — POTASSIUM CHLORIDE CRYS ER 20 MEQ PO TBCR
40.0000 meq | EXTENDED_RELEASE_TABLET | Freq: Once | ORAL | Status: AC
  Administered 2012-03-11: 40 meq via ORAL
  Filled 2012-03-11 (×2): qty 2

## 2012-03-11 NOTE — Progress Notes (Addendum)
[facility] Progress Note [place] TEAM 1 - Stepdown/ICU TEAM   HAWKE VILLALPANDO WUJ:[id number] DOB: [birth year] DOA: 03/04/2012 PCP: [name], MD  Brief narrative: 82/M with PMH of Prostate CA treated with XRT 8/13, DM, HTN was in his usual state of health 2-3 weeks ago. His family has noticed periods of disorientation over the last 2-3 weeks, he would say things that didn't make sense like thinking he was in [place], talking about breakfast at dinner time etc.  The day prior to his admit he went to visit his wife who is temporarily staying with her parents late at night which is very late for him as he is usually in bed by then.  The morning of his admit his wife got a call from the police saying that he was found confused in his car outside a church.  He was brought to ED by EMS, and was found to be hyperglycemic with CBG of 500 without an anion gap. His Blood glucose improved with Insulin gtt, but he remained agitated and confused.  Family denied Fevers, chills, N/V/Diarrhea, urinary symptoms, cough, congestion, SOB, or recent medication changes.  Assessment/Plan:  Altered mental status  CT scan revealed diffuse cortical atrophy but no acute abnormality - MRI was of poor quality due to patient motion but no acute insult/mass is appreciable - urinalysis was unrevealing at admission - urine drug screen, serum calcium, and ammonia levels were all unrevealing - B12, folate, cortisol, and TSH were normal - RPR was non-reactive - Neurology initially initiated acyclovir for possible HSV encephalitis, but LP HSV studies negative for HSV - on review of his home med bottles, I find that he is prescribed decadron "for joint pain" but pill counts suggest that he has not taken it in quite a while - I agree that his presenting idiopathic delirium has likely cleared, with a new delirium having developed due to his ARF and UTI - pt is much improved today (conversant and only moderately confused - not  agitated or combative)  Acute renal failure / urinary retention Urinary retention is likely the cause - acyclovir could also be contributing - acyclovir has been stopped - placed foley (blader scan revealed >700 cc retained urine) - renal US w/o acute findings - signif post-obstructive diuresis continues - crt continuing to slowly improve - cont to follow trend - keep IVF for now, follow and arecheck  Metabolic acidosis  Due to above - resolved - bicarb 12/2 - follow   Hypernatremia due to hypovolemia w/ no oral intake and w/ post-obst diuresis - -resolved, continue current ivf for today, follow recheck and further treat accordingly  UTI/pyelonephritis Unfortunately, urine culture did not identify the offending pathogen - will complete 10 days of empiric tx (given complex hx of prostate CA)  Prostate cancer Neither CT scan nor MRI revealed evidence of brain masses  Diabetes mellitus - severely uncontrolled CBG much improved from the time of admission - continue SSI and lantus,  - of note, A1c markedly elevated at 10.9 -will d/cd5  Hypertension Blood pressure improved today - follow w/o change  Hypercholesterolemia  Hypokalemia Due to poor intake + IV bicarb -  bicarb dc'ed as above -replace k  Normocytic anemia ?due to poor nutrition - no clear evidence of blood loss - follow trend  Nutrition Will resume diet today - if evidence of aspiration noted, will ask for SLP consult   Code Status: Full Family Communication: spoke w/ wife at bedside  Disposition Plan: SNF when  medically ready  Consultants: Neurology  Procedures: LP 03/06/2012 - no clear evidence of infection/abnorm  Antibiotics: Acyclovir 03/04/2012 >>11/29 Rocephin 11/30 >>  DVT prophylaxis: Sub Q heparin  HPI/Subjective: Pt alert and appropriate, he denies any c/o. Wife at bedisde  Objective: Blood pressure 121/56, pulse 76, temperature 98 F (36.7 C), temperature source Oral, resp. rate 17, height 5'  8" (1.727 m), weight 77.7 kg (171 lb 4.8 oz), SpO2 98.00%.  Intake/Output Summary (Last 24 hours) at 03/11/12 1024 Last data filed at 03/11/12 0750  Gross per 24 hour  Intake   1315 ml  Output    901 ml  Net    414 ml     Exam: General: No acute respiratory distress - much more alert Lungs: Clear to auscultation bilaterally without wheezes or crackles  Cardiovascular: regular rate and regular rhythm, no appreciable M Abdomen: Nontender, nondistended, soft, bowel sounds positive, no rebound, no ascites, no appreciable mass Extremities: No significant cyanosis, clubbing, or edema bilateral lower extremities NEURO: moving all 4 ext spontaneously, CNII-XII intact B, alert and oriented to place and person  Data Reviewed: Basic Metabolic Panel:  Lab 03/11/12 1610 03/10/12 0500 03/09/12 0520 03/08/12 0830 03/07/12 1633  NA 139 150* 151* 148* 140  K 3.2* 3.0* 3.5 4.1 3.8  CL 98 106 112 115* 110  CO2 30 33* 27 15* 17*  GLUCOSE 193* 193* 150* 158* 122*  BUN 49* 48* 51* 48* 41*  CREATININE 3.44* 4.36* 5.52* 6.02* 5.37*  CALCIUM 8.1* 8.4 8.8 8.9 8.9  MG -- -- -- -- --  PHOS -- -- -- 6.0* --   Liver Function Tests:  Lab 03/10/12 0500 03/08/12 0830 03/06/12 0445  AST 13 -- 17  ALT 9 -- 13  ALKPHOS 58 -- 73  BILITOT 0.8 -- 0.9  PROT 6.1 -- 6.8  ALBUMIN 2.7* 3.1* 3.4*   No results found for this basename: LIPASE:5,AMYLASE:5 in the last 168 hours No results found for this basename: AMMONIA:5 in the last 168 hours CBC:  Lab 03/11/12 0622 03/10/12 0500 03/09/12 0520 03/07/12 0432 03/06/12 0445  WBC 6.4 6.3 9.4 10.6* 11.0*  NEUTROABS -- -- -- -- --  HGB 8.9* 8.5* 9.3* 10.3* 11.3*  HCT 25.5* 24.9* 26.7* 29.1* 32.2*  MCV 93.4 94.3 91.1 91.2 92.0  PLT 160 134* 166 161 188   Cardiac Enzymes:  Lab 03/04/12 1458  CKTOTAL 278*  CKMB 3.4  CKMBINDEX --  TROPONINI --   CBG:  Lab 03/11/12 0808 03/11/12 0400 03/10/12 2347 03/10/12 2037 03/10/12 1704  GLUCAP 190* 194* 151* 98 283*     Studies:  I have reviewed all Recent x-ray studies in detail Scheduled Meds:  I Reviewed all meds in detail.   Donnalee Curry MD [facility] Office  [phone] Pager [phone]  On-Call/Text Page:      [email or web address]      password [email or web address]  If 7PM-7AM, please contact night-coverage [email or web address] Password [email or web address] 03/11/2012, 10:24 AM   LOS: 7 days

## 2012-03-11 NOTE — Progress Notes (Signed)
Subjective: Much better this morning. Wife reports that "his old personality has come back"  Exam: Filed Vitals:   03/10/12 2038  BP: 121/56  Pulse: 76  Temp: 98 F (36.7 C)  Resp: 17   Gen: In bed, not agitated.  MS: Awake, alert, nondysarthric. Oriented to place, but not year.  He is appropriate in his conversation today.  WU:JWJXB, fixates and tracks across midline in both directions, VFF  Motor: moves all extremities well Sensory:repsonds to touch in all 4 ext.   Impression: 78 yo M with progressive confusion on admission with likely URI prior to admission. He was clearing, but then developed UTI and renal failure, likely due to a combination of urinary retention and acyclovir. His BUN remains elevated, but cr is improving.   I suspect that he had a delirium secondary to an infection on admission and then was clearing when he developed uti+renal failure and is now encephalopathic secondary to this. His continued improvement would argue for this explanation rather than a progressive cause.    Recommendations: 1) treat AKI, hypernatremia, and UTI per internal medicine 2) At this time, with continued improvement I would not recommend further workup for mental status. Neurology will sign off, please call with any questions.   Ritta Slot, MD [facility] [phone]  If 7pm- 7am, please page neurology on call at [phone].

## 2012-03-11 NOTE — Progress Notes (Signed)
OT Cancellation Note  Patient Details Name: Craig Henderson MRN: [id number] DOB: 06/14/1929   Cancelled Treatment:     Pt declined due to overall fatigue. Will perform OT eval next day  Samar Dass, [place]ung 03/11/2012, 3:37 PM

## 2012-03-11 NOTE — Evaluation (Signed)
Physical Therapy Evaluation Patient Details Name: Craig Henderson MRN: [id number] DOB: September 23, 1929 Today's Date: 03/11/2012 Time: 4098-1191 PT Time Calculation (min): 25 min  PT Assessment / Plan / Recommendation Clinical Impression  Pt admitted with AMS. Patient remains to have confusion, delayed processing and minimal problem solving. Patient spouse present and aware he needs 24/7 supervision however is unable to provide due to she is recovering from a colectomy and staying with her mother. Patient may need ST-SNF to address cognitive and balance impairments to achieve safe independent function prior to transition home.    PT Assessment  Patient needs continued PT services    Follow Up Recommendations  SNF;Supervision/Assistance - 24 hour    Does the patient have the potential to tolerate intense rehabilitation      Barriers to Discharge Decreased caregiver support      Equipment Recommendations   (defer to next facility)    Recommendations for Other Services     Frequency Min 3X/week    Precautions / Restrictions Precautions Precautions: Fall Restrictions Weight Bearing Restrictions: No   Pertinent Vitals/Pain Denies pain      Mobility  Bed Mobility Bed Mobility: Supine to Sit Supine to Sit: 4: Min assist;With rails;HOB elevated Details for Bed Mobility Assistance: increased time, tactile and verbal cues to complete transfer Transfers Transfers: Sit to Stand;Stand to Sit Sit to Stand: 4: Min assist;With upper extremity assist;From bed Stand to Sit: 4: Min assist;With upper extremity assist;To chair/3-in-1 Details for Transfer Assistance: increased time, assist to achieve upright posture Ambulation/Gait Ambulation/Gait Assistance: 4: Min assist Ambulation Distance (Feet): 10 Feet Assistive device: Rolling walker Ambulation/Gait Assistance Details: + LOB requiring modA to maintain balance, minA for RW management Gait Pattern: Shuffle;Trunk flexed Gait velocity:  slow Stairs: No    Shoulder Instructions     Exercises     PT Diagnosis: Difficulty walking;Generalized weakness  PT Problem List: Decreased strength;Decreased activity tolerance;Decreased balance;Decreased cognition PT Treatment Interventions: Gait training;Functional mobility training;Therapeutic activities;Therapeutic exercise   PT Goals Acute Rehab PT Goals PT Goal Formulation: With patient/family Time For Goal Achievement: 03/18/12 Potential to Achieve Goals: Fair Pt will go Sit to Stand: with modified independence;with upper extremity assist (up to RW.) PT Goal: Sit to Stand - Progress: Goal set today Pt will Ambulate: 51 - 150 feet;with modified independence;with rolling walker PT Goal: Ambulate - Progress: Goal set today Pt will Go Up / Down Stairs: 3-5 stairs;with supervision;with rail(s) PT Goal: Up/Down Stairs - Progress: Goal set today  Visit Information  Last PT Received On: 03/11/12 Assistance Needed: +1    Subjective Data  Subjective: Pt received supine in bed, mild confusion.   Prior Functioning  Home Living Lives With: Spouse (who is living with mother now due to recovering from surgery) Available Help at Discharge: Available PRN/intermittently;Family (family works) Type of Home: House Home Access: Stairs to enter Secretary/administrator of Steps: 3 Entrance Stairs-Rails: Left Home Layout: One level Bathroom Shower/Tub: Associate Professor: Yes Additional Comments: spouse reports she is unable to assist patient because she is recovering from colectomy, daughters work Prior Function Level of Independence: Independent Able to Take Stairs?: Yes Driving: Yes Vocation: Retired Musician:  (delayed response) Dominant Hand: Right    Cognition  Overall Cognitive Status: Appears within functional limits for tasks assessed/performed Arousal/Alertness: Awake/alert Orientation Level: Disoriented  to;Time;Situation;Place, unable to recall with v/cs Behavior During Session: Flat affect Cognition - Other Comments: delayed response time, possible expressive difficulties, word finding difficulties, memory impairments  Extremity/Trunk Assessment Right Upper Extremity Assessment RUE ROM/Strength/Tone: Within functional levels Left Upper Extremity Assessment LUE ROM/Strength/Tone: Within functional levels Right Lower Extremity Assessment RLE ROM/Strength/Tone: Within functional levels Left Lower Extremity Assessment LLE ROM/Strength/Tone: Within functional levels Trunk Assessment Trunk Assessment: Normal   Balance Balance Balance Assessed: Yes Static Sitting Balance Static Sitting - Balance Support: No upper extremity supported Static Sitting - Level of Assistance: 5: Stand by assistance Static Sitting - Comment/# of Minutes: 5  End of Session PT - End of Session Equipment Utilized During Treatment: Gait belt Activity Tolerance: Patient tolerated treatment well Patient left: in chair;with call bell/phone within reach;with family/visitor present Nurse Communication: Mobility status  GP     Marcene Brawn 03/11/2012, 1:17 PM  Lewis Shock, PT, DPT Pager #: [phone] Office #: [phone]

## 2012-03-12 LAB — GLUCOSE, CAPILLARY
Glucose-Capillary: 101 mg/dL — ABNORMAL HIGH (ref 70–99)
Glucose-Capillary: 204 mg/dL — ABNORMAL HIGH (ref 70–99)
Glucose-Capillary: 266 mg/dL — ABNORMAL HIGH (ref 70–99)
Glucose-Capillary: 235 mg/dL — ABNORMAL HIGH (ref 70–99)

## 2012-03-12 LAB — BASIC METABOLIC PANEL
BUN: 46 mg/dL — ABNORMAL HIGH (ref 6–23)
CO2: 26 mEq/L (ref 19–32)
Chloride: 99 mEq/L (ref 96–112)
Creatinine, Ser: 2.8 mg/dL — ABNORMAL HIGH (ref 0.50–1.35)
Glucose, Bld: 201 mg/dL — ABNORMAL HIGH (ref 70–99)

## 2012-03-12 LAB — MAGNESIUM: Magnesium: 1.3 mg/dL — ABNORMAL LOW (ref 1.5–2.5)

## 2012-03-12 MED ORDER — POTASSIUM CHLORIDE CRYS ER 20 MEQ PO TBCR
40.0000 meq | EXTENDED_RELEASE_TABLET | Freq: Once | ORAL | Status: AC
  Administered 2012-03-12: 40 meq via ORAL
  Filled 2012-03-12: qty 2

## 2012-03-12 MED ORDER — SODIUM CHLORIDE 0.9 % IV SOLN
INTRAVENOUS | Status: DC
  Administered 2012-03-12: 08:00:00 via INTRAVENOUS
  Administered 2012-03-13: 1000 mL via INTRAVENOUS
  Administered 2012-03-14: 75 mL via INTRAVENOUS
  Administered 2012-03-14: 1000 mL via INTRAVENOUS
  Administered 2012-03-15 – 2012-03-16 (×2): via INTRAVENOUS

## 2012-03-12 MED ORDER — MAGNESIUM SULFATE 50 % IJ SOLN
3.0000 g | Freq: Once | INTRAVENOUS | Status: AC
  Administered 2012-03-12: 3 g via INTRAVENOUS
  Filled 2012-03-12: qty 6

## 2012-03-12 MED ORDER — SODIUM CHLORIDE 0.45 % IV SOLN
INTRAVENOUS | Status: DC
  Administered 2012-03-12: 01:00:00 via INTRAVENOUS
  Filled 2012-03-12 (×2): qty 1000

## 2012-03-12 NOTE — Progress Notes (Signed)
[facility] Progress Note [place] TEAM 4   Craig Henderson ZOX:[id number] DOB: July 09, 1929 DOA: 03/04/2012 PCP: [name], MD  Brief narrative: 82/M with PMH of Prostate CA treated with XRT 8/13, DM, HTN was in his usual state of health 2-3 weeks ago. His family has noticed periods of disorientation over the last 2-3 weeks, he would say things that didn't make sense like thinking he was in [place], talking about breakfast at dinner time etc.  The day prior to his admit he went to visit his wife who is temporarily staying with her parents late at night which is very late for him as he is usually in bed by then.  The morning of his admit his wife got a call from the police saying that he was found confused in his car outside a church.  He was brought to ED by EMS, and was found to be hyperglycemic with CBG of 500 without an anion gap. His Blood glucose improved with Insulin gtt, but he remained agitated and confused.  Family denied Fevers, chills, N/V/Diarrhea, urinary symptoms, cough, congestion, SOB, or recent medication changes.  Assessment/Plan:  Altered mental status/acute encephalopathy CT scan revealed diffuse cortical atrophy but no acute abnormality - MRI was of poor quality due to patient motion but no acute insult/mass is appreciable - urinalysis was unrevealing at admission - urine drug screen, serum calcium, and ammonia levels were all unrevealing - B12, folate, cortisol, and TSH were normal - RPR was non-reactive - Neurology initially initiated acyclovir for possible HSV encephalitis, but LP HSV studies negative for HSV - on review of his home med bottles, I find that he is prescribed decadron "for joint pain" but pill counts suggest that he has not taken it in quite a while - I agree that his presenting idiopathic delirium has likely cleared, with a new delirium having developed due to his ARF and UTI - pt is much improved today (conversant and only moderately confused - not  agitated or combative)  Acute renal failure / urinary retention Urinary retention is likely the cause - acyclovir could also be contributing - acyclovir has been stopped - placed foley (blader scan revealed >700 cc retained urine) - renal US w/o acute findings - signif post-obstructive diuresis continues - crt continuing to slowly improve - cont to follow trend - keep IVF for now, follow and recheck  Metabolic acidosis.  Due to above - resolved - bicarb 12/2 - follow   Hypernatremia due to hypovolemia w/ no oral intake and w/ post-obst diuresis - -resolved, continue current ivf for today, follow recheck and further treat accordingly  UTI/pyelonephritis Unfortunately, urine culture did not identify the offending pathogen - will complete 10 days of empiric tx (given complex hx of prostate CA)  Prostate cancer Neither CT scan nor MRI revealed evidence of brain masses  Diabetes mellitus - severely uncontrolled CBG much improved from the time of admission - continue SSI and lantus,  - of note, A1c markedly elevated at 10.9  Hypertension Blood pressure improved today - follow w/o change  Hypercholesterolemia  Hypokalemia/hypomagnesemia Due to poor intake + IV bicarb -  bicarb dc'ed as above -replace k. Replace magnesium.  Normocytic anemia ?due to poor nutrition - no clear evidence of blood loss - follow trend. Followup as outpatient.  Nutrition Continue  current diet  - if evidence of aspiration noted, will ask for SLP consult   Code Status: Full Family Communication: Updated patient. No family at bedside. Disposition Plan: SNF  when medically ready  Consultants: Neurology: Dr Thad Ranger 03/04/2012  Procedures: LP 03/06/2012 - no clear evidence of infection/abnorm  Antibiotics: Acyclovir 03/04/2012 >>11/29 Rocephin 11/30 >>  DVT prophylaxis: Sub Q heparin  HPI/Subjective: Pt alert and appropriate, he denies any c/o. Patient is alert to self and place patientDecember  however year is wrong. Patient also knows who the president is.  Objective: Blood pressure 111/59, pulse 72, temperature 98.2 F (36.8 C), temperature source Oral, resp. rate 18, height 5\' 8"  (1.727 m), weight 77.7 kg (171 lb 4.8 oz), SpO2 98.00%.  Intake/Output Summary (Last 24 hours) at 03/12/12 1312 Last data filed at 03/12/12 0800  Gross per 24 hour  Intake    240 ml  Output   2900 ml  Net  -2660 ml     Exam: General: No acute respiratory distress - much more alert Lungs: Clear to auscultation bilaterally without wheezes or crackles  Cardiovascular: regular rate and regular rhythm, no appreciable M Abdomen: Nontender, nondistended, soft, bowel sounds positive, no rebound, no ascites, no appreciable mass Extremities: No significant cyanosis, clubbing, or edema bilateral lower extremities NEURO: moving all 4 ext spontaneously, CNII-XII intact B, alert and oriented to place and person  Data Reviewed: Basic Metabolic Panel:  Lab 03/12/12 1610 03/11/12 0622 03/10/12 0500 03/09/12 0520 03/08/12 0830  NA 136 139 150* 151* 148*  K 3.4* 3.2* 3.0* 3.5 4.1  CL 99 98 106 112 115*  CO2 26 30 33* 27 15*  GLUCOSE 201* 193* 193* 150* 158*  BUN 46* 49* 48* 51* 48*  CREATININE 2.80* 3.44* 4.36* 5.52* 6.02*  CALCIUM 8.1* 8.1* 8.4 8.8 8.9  MG 1.3* -- -- -- --  PHOS -- -- -- -- 6.0*   Liver Function Tests:  Lab 03/10/12 0500 03/08/12 0830 03/06/12 0445  AST 13 -- 17  ALT 9 -- 13  ALKPHOS 58 -- 73  BILITOT 0.8 -- 0.9  PROT 6.1 -- 6.8  ALBUMIN 2.7* 3.1* 3.4*   No results found for this basename: LIPASE:5,AMYLASE:5 in the last 168 hours No results found for this basename: AMMONIA:5 in the last 168 hours CBC:  Lab 03/11/12 0622 03/10/12 0500 03/09/12 0520 03/07/12 0432 03/06/12 0445  WBC 6.4 6.3 9.4 10.6* 11.0*  NEUTROABS -- -- -- -- --  HGB 8.9* 8.5* 9.3* 10.3* 11.3*  HCT 25.5* 24.9* 26.7* 29.1* 32.2*  MCV 93.4 94.3 91.1 91.2 92.0  PLT 160 134* 166 161 188   Cardiac  Enzymes: No results found for this basename: CKTOTAL:5,CKMB:5,CKMBINDEX:5,TROPONINI:5 in the last 168 hours CBG:  Lab 03/12/12 1149 03/12/12 0818 03/12/12 0334 03/11/12 2341 03/11/12 2014  GLUCAP 187* 204* 101* 133* 223*    Studies:  I have reviewed all Recent x-ray studies in detail Scheduled Meds:  I Reviewed all meds in detail.   Ramiro Harvest MD [facility] Office  [phone] Pager [phone]  On-Call/Text Page:      [email or web address]      password [email or web address]  If 7PM-7AM, please contact night-coverage [email or web address] Password [email or web address] 03/12/2012, 1:12 PM   LOS: 8 days

## 2012-03-12 NOTE — Evaluation (Signed)
Occupational Therapy Evaluation Patient Details Name: DEIGO ALONSO MRN: [id number] DOB: 1929/07/14 Today's Date: 03/12/2012 Time: 4098-1191 OT Time Calculation (min): 33 min  OT Assessment / Plan / Recommendation Clinical Impression  This 78 yo male admitted with AMS presents to acute OT with deficts below. Will benefit from acute OT with follow up at SNF, unless he can have 24/7 S/prn A at home.    OT Assessment  Patient needs continued OT Services    Follow Up Recommendations  SNF    Barriers to Discharge Decreased caregiver support    Equipment Recommendations  3 in 1 bedside comode       Frequency  Min 2X/week    Precautions / Restrictions Precautions Precautions: Fall Restrictions Weight Bearing Restrictions: No   Pertinent Vitals/Pain Dizziness upon sitting up and transferring to the recliner    ADL  Eating/Feeding: Simulated;Set up Where Assessed - Eating/Feeding: Chair Grooming: Simulated;Set up;Supervision/safety Where Assessed - Grooming: Supported sitting Upper Body Bathing: Simulated;Set up;Supervision/safety Where Assessed - Upper Body Bathing: Supported sitting Lower Body Bathing: Simulated;Maximal assistance (with total A +2 for pericare (one stand one wash)) Where Assessed - Lower Body Bathing: Supported sit to stand Upper Body Dressing: Simulated;Maximal assistance Where Assessed - Upper Body Dressing: Supported sitting Lower Body Dressing: Simulated;+1 Total assistance (with total A +2 for pericare (one stand one wash)) Where Assessed - Lower Body Dressing: Supported sit to Pharmacist, hospital: Simulated;Moderate assistance Toilet Transfer Method: Sit to stand Toilet Transfer Equipment:  (Bed to recliner on his right) Toileting - Clothing Manipulation and Hygiene: Performed;+1 Total assistance (+1 to stand as well) Where Assessed - Toileting Clothing Manipulation and Hygiene: Standing Equipment Used: Gait belt Transfers/Ambulation Related to  ADLs: Mod A sit to stand, Max stand to sit due to decreased control for descent. Did not attempt ambulation ADL Comments: Pt did report dizziness when he sat up and when he stood to get into the recliner    OT Diagnosis: Generalized weakness;Cognitive deficits  OT Problem List: Impaired balance (sitting and/or standing);Decreased cognition;Decreased safety awareness;Decreased knowledge of use of DME or AE OT Treatment Interventions: Self-care/ADL training;Therapeutic activities;Cognitive remediation/compensation;Patient/family education;Balance training;DME and/or AE instruction   OT Goals Acute Rehab OT Goals OT Goal Formulation: With patient Time For Goal Achievement: 03/26/12 Potential to Achieve Goals: Good ADL Goals Pt Will Perform Grooming: with set-up;with supervision;Unsupported;Sitting, edge of bed (2 tasks without LOB) ADL Goal: Grooming - Progress: Goal set today Pt Will Transfer to Toilet: with min assist;Ambulation;with DME;Comfort height toilet;Grab bars ADL Goal: Toilet Transfer - Progress: Goal set today Miscellaneous OT Goals Miscellaneous OT Goal #1: Pt will self correct static sitting balance without cues OT Goal: Miscellaneous Goal #1 - Progress: Goal set today Miscellaneous OT Goal #2: Pt will be able to go stand with AD with min guard A to A with BADLs OT Goal: Miscellaneous Goal #2 - Progress: Goal set today  Visit Information  Last OT Received On: 03/12/12 Assistance Needed: +1    Subjective Data  Subjective: I'm here because I have lost weight Patient Stated Goal: Did not ask   Prior Functioning     Home Living Lives With: Alone (wife living with other family right now due to recent sx) Available Help at Discharge: Available PRN/intermittently;Family Type of Home: House Home Access: Stairs to enter [employer] of Steps: 3 Entrance Stairs-Rails: Left Home Layout: One level Bathroom Shower/Tub: Engineer, manufacturing systems:  Standard Bathroom Accessibility: Yes Home Adaptive Equipment:  (Unknown) Prior Function Level of Independence:  Independent Able to Take Stairs?: Yes Driving: Yes Vocation: Retired Musician: No difficulties Dominant Hand:  (ambidextrous)            Cognition  Overall Cognitive Status: Impaired Area of Impairment: Awareness of errors Arousal/Alertness: Awake/alert Orientation Level: Disoriented to;Time;Situation Behavior During Session: Flat affect Awareness of Errors - Other Comments: Pt did not attempt to correct his sitting balance when he would start leaning to the left or forward too much    Extremity/Trunk Assessment Right Upper Extremity Assessment RUE ROM/Strength/Tone: Within functional levels Left Upper Extremity Assessment LUE ROM/Strength/Tone: Within functional levels     Mobility Bed Mobility Bed Mobility: Rolling Right;Right Sidelying to Sit;Sitting - Scoot to Edge of Bed Rolling Right: 4: Min guard;With rail Right Sidelying to Sit: 4: Min guard;With rails;HOB elevated (30 degrees) Sitting - Scoot to Edge of Bed: 4: Min guard;With rail Transfers Transfers: Sit to Stand;Stand to Sit Sit to Stand: 3: Mod assist;With upper extremity assist;From bed Stand to Sit: 2: Max assist;With upper extremity assist;With armrests;To chair/3-in-1 Details for Transfer Assistance: VCs for hand placement and decreased control for stand to sit           Balance Static Sitting Balance Static Sitting - Balance Support: Right upper extremity supported;Left upper extremity supported;Feet unsupported Static Sitting - Level of Assistance:  (min guard A--verbal cues for him to correct his balance)   End of Session OT - End of Session Equipment Utilized During Treatment: Gait belt Activity Tolerance: Patient tolerated treatment well Patient left: in chair;with call bell/phone within reach;with chair alarm set Nurse Communication:  (NT in room with me to help  clean him up while standing)       Evette Georges [phone] 03/12/2012, 11:15 AM

## 2012-03-12 NOTE — Progress Notes (Signed)
Nutrition Follow-up  Intervention:   Encourage good PO intake Continue ensure pudding TID.   Assessment:   Pt admitted with AMS of unknown etiology.  Pt with hx of prostate cancer, s/p radiation therapy. Weight hx shows weight trending down over the last several months, more so in the past 2 months. Pt and wife report that appetite is good. Per RN pt has been eating everything. Pt likes ensure pudding and is eating them.    Diet Order:  CHO Modified Medium Meal Completion: 100%   Meds: Scheduled Meds:    . antiseptic oral rinse  15 mL Mouth Rinse BID  . cefTRIAXone (ROCEPHIN)  IV  1 g Intravenous Q24H  . cloNIDine  0.1 mg Transdermal Weekly  . feeding supplement  1 Container Oral TID BM  . heparin subcutaneous  5,000 Units Subcutaneous Q8H  . insulin aspart  0-20 Units Subcutaneous Q4H  . insulin glargine  15 Units Subcutaneous Daily  . magnesium sulfate 1 - 4 g bolus IVPB  3 g Intravenous Once  . multivitamin  1 tablet Oral Daily  . [COMPLETED] potassium chloride  40 mEq Oral Once  . [COMPLETED] potassium chloride  40 mEq Oral Once   Continuous Infusions:    . sodium chloride 75 mL/hr at 03/12/12 0827  . [DISCONTINUED] dextrose 5 % with KCl 20 mEq / L 20 mEq (03/11/12 0349)  . [DISCONTINUED] sodium chloride 0.45 % 1,000 mL with potassium chloride 20 mEq infusion 75 mL/hr at 03/12/12 0030   PRN Meds:.acetaminophen, acetaminophen, labetalol, LORazepam, ondansetron (ZOFRAN) IV   CMP     Component Value Date/Time   NA 136 03/12/2012 0626   K 3.4* 03/12/2012 0626   CL 99 03/12/2012 0626   CO2 26 03/12/2012 0626   GLUCOSE 201* 03/12/2012 0626   BUN 46* 03/12/2012 0626   CREATININE 2.80* 03/12/2012 0626   CALCIUM 8.1* 03/12/2012 0626   PROT 6.1 03/10/2012 0500   ALBUMIN 2.7* 03/10/2012 0500   AST 13 03/10/2012 0500   ALT 9 03/10/2012 0500   ALKPHOS 58 03/10/2012 0500   BILITOT 0.8 03/10/2012 0500   GFRNONAA 20* 03/12/2012 0626   GFRAA 23* 03/12/2012 0626    CBG (last 3)    Basename 03/12/12 1149 03/12/12 0818 03/12/12 0334  GLUCAP 187* 204* 101*     Intake/Output Summary (Last 24 hours) at 03/12/12 1639 Last data filed at 03/12/12 0800  Gross per 24 hour  Intake    240 ml  Output   2900 ml  Net  -2660 ml    Weight Status:  No new weights   Re-estimated needs:  1750-1900 kcal, 75-90 gm protein   Nutrition Dx:  Inadequate oral intake r/t AMS as evidenced by NPO; resolved.    Goal:  Diet advance to meet nutrition needs. Met.   Monitor:  Diet advance, swallow evaluation, weight, labs   Kendell Bane RD, LDN, CNSC [phone] Pager [phone] After Hours Pager

## 2012-03-13 LAB — CBC
MCV: 92.8 fL (ref 78.0–100.0)
Platelets: 184 10*3/uL (ref 150–400)
RBC: 2.64 MIL/uL — ABNORMAL LOW (ref 4.22–5.81)
RDW: 12.8 % (ref 11.5–15.5)
WBC: 7.6 10*3/uL (ref 4.0–10.5)

## 2012-03-13 LAB — BASIC METABOLIC PANEL
CO2: 22 mEq/L (ref 19–32)
Calcium: 8.5 mg/dL (ref 8.4–10.5)
GFR calc Af Amer: 32 mL/min — ABNORMAL LOW (ref >90)
Sodium: 140 mEq/L (ref 135–145)

## 2012-03-13 LAB — GLUCOSE, CAPILLARY
Glucose-Capillary: 117 mg/dL — ABNORMAL HIGH (ref 70–99)
Glucose-Capillary: 171 mg/dL — ABNORMAL HIGH (ref 70–99)
Glucose-Capillary: 180 mg/dL — ABNORMAL HIGH (ref 70–99)
Glucose-Capillary: 174 mg/dL — ABNORMAL HIGH (ref 70–99)

## 2012-03-13 LAB — MAGNESIUM: Magnesium: 1.8 mg/dL (ref 1.5–2.5)

## 2012-03-13 MED ORDER — MAGNESIUM SULFATE 40 MG/ML IJ SOLN
2.0000 g | Freq: Once | INTRAMUSCULAR | Status: AC
  Administered 2012-03-13: 2 g via INTRAVENOUS
  Filled 2012-03-13: qty 50

## 2012-03-13 NOTE — Clinical Social Work Psychosocial (Signed)
     Clinical Social Work Department BRIEF PSYCHOSOCIAL ASSESSMENT 03/13/2012  Patient:  Craig Henderson, Craig Henderson     Account Number:  [id number]     Admit date:  03/04/2012  Clinical Social Worker:  Peggyann Shoals  Date/Time:  03/12/2012 12:00 N  Referred by:  Physician  Date Referred:  03/12/2012 Referred for  SNF Placement   Other Referral:   Interview type:  Patient Other interview type:    PSYCHOSOCIAL DATA Living Status:  WIFE Admitted from facility:   Level of care:   Primary support name:  Craig Henderson Primary support relationship to patient:  SPOUSE Degree of support available:   very supportive    CURRENT CONCERNS Current Concerns  Post-Acute Placement   Other Concerns:    SOCIAL WORK ASSESSMENT / PLAN CSW met with pt to address consult for SNF. CSW introduced herself and explained role of social work. PT is recommending SNF at discharge. CSW also explained the process of discharging to SNF.    CSW will follow up with pt's wife. CSW will initiate SNF search and follow up with bed offers. CSW will continue to follow.   Assessment/plan status:  Psychosocial Support/Ongoing Assessment of Needs Other assessment/ plan:   Information/referral to community resources:   SNF list    PATIENTS/FAMILYS RESPONSE TO PLAN OF CARE: Pt was very pleasant and agreeable to discharge plan. Pt would like this CSW to follow up with pt's wife.

## 2012-03-13 NOTE — Progress Notes (Signed)
[facility] Progress Note [place] TEAM 4   JACOBE STUDY ZOX:[id number] DOB: 1929/04/17 DOA: 03/04/2012 PCP: [name], MD  Brief narrative: 82/M with PMH of Prostate CA treated with XRT 8/13, DM, HTN was in his usual state of health 2-3 weeks ago. His family has noticed periods of disorientation over the last 2-3 weeks, he would say things that didn't make sense like thinking he was in [place], talking about breakfast at dinner time etc.  The day prior to his admit he went to visit his wife who is temporarily staying with her parents late at night which is very late for him as he is usually in bed by then.  The morning of his admit his wife got a call from the police saying that he was found confused in his car outside a church.  He was brought to ED by EMS, and was found to be hyperglycemic with CBG of 500 without an anion gap. His Blood glucose improved with Insulin gtt, but he remained agitated and confused.  Family denied Fevers, chills, N/V/Diarrhea, urinary symptoms, cough, congestion, SOB, or recent medication changes.  Assessment/Plan:  Altered mental status/acute encephalopathy CT scan revealed diffuse cortical atrophy but no acute abnormality - MRI was of poor quality due to patient motion but no acute insult/mass is appreciable - urinalysis was unrevealing at admission - urine drug screen, serum calcium, and ammonia levels were all unrevealing - B12, folate, cortisol, and TSH were normal - RPR was non-reactive - Neurology initially initiated acyclovir for possible HSV encephalitis, but LP HSV studies negative for HSV - on review of his home med bottles, I find that he is prescribed decadron "for joint pain" but pill counts suggest that he has not taken it in quite a while - I agree that his presenting idiopathic delirium has likely cleared, with a new delirium having developed due to his ARF and UTI - pt is much improved today (conversant and only moderately confused - not  agitated or combative)  Acute renal failure / urinary retention Urinary retention is likely the cause - acyclovir could also be contributing - acyclovir has been stopped - placed foley (blader scan revealed >700 cc retained urine) - renal US w/o acute findings - signif post-obstructive diuresis continues - crt continuing to slowly improve - cont to follow trend - keep IVF for now, follow and recheck.   Metabolic acidosis.  Due to above - resolved - bicarb 12/2 - follow   Hypernatremia due to hypovolemia w/ no oral intake and w/ post-obst diuresis - -resolved, continue current ivf for today, follow recheck and further treat accordingly  UTI/pyelonephritis Unfortunately, urine culture did not identify the offending pathogen - will complete 10 days of empiric tx (given complex hx of prostate CA)  Prostate cancer Neither CT scan nor MRI revealed evidence of brain masses  Diabetes mellitus - severely uncontrolled CBG much improved from the time of admission - continue SSI and lantus,  - of note, A1c markedly elevated at 10.9  Hypertension Blood pressure improved. Continue clonidine patch.  Hypercholesterolemia  Hypokalemia/hypomagnesemia Due to poor intake + IV bicarb -  bicarb dc'ed as above -replaced k. Replace magnesium.  Normocytic anemia ?due to poor nutrition - no clear evidence of blood loss - follow trend. Followup as outpatient.  Nutrition Continue  current diet  - if evidence of aspiration noted, will ask for SLP consult   Code Status: Full Family Communication: Updated patient. No family at bedside. Disposition Plan: SNF when  medically ready  Consultants: Neurology: Dr Thad Ranger 03/04/2012  Procedures: LP 03/06/2012 - no clear evidence of infection/abnorm  Antibiotics: Acyclovir 03/04/2012 >>11/29 Rocephin 11/30 >>  DVT prophylaxis: Sub Q heparin  HPI/Subjective: Pt alert and oriented. Per wife patient with clinical improvement.  Objective: Blood pressure  131/72, pulse 70, temperature 97.3 F (36.3 C), temperature source Oral, resp. rate 18, height 5\' 8"  (1.727 m), weight 77.7 kg (171 lb 4.8 oz), SpO2 100.00%.  Intake/Output Summary (Last 24 hours) at 03/13/12 1146 Last data filed at 03/13/12 1012  Gross per 24 hour  Intake    300 ml  Output   4900 ml  Net  -4600 ml     Exam: General: No acute respiratory distress - much more alert Lungs: Clear to auscultation bilaterally without wheezes or crackles  Cardiovascular: regular rate and regular rhythm, no appreciable M Abdomen: Nontender, nondistended, soft, bowel sounds positive, no rebound, no ascites, no appreciable mass Extremities: No significant cyanosis, clubbing, or edema bilateral lower extremities NEURO: moving all 4 ext spontaneously, CNII-XII intact B, alert and oriented to place and person  Data Reviewed: Basic Metabolic Panel:  Lab 03/13/12 1610 03/12/12 0626 03/11/12 0622 03/10/12 0500 03/09/12 0520 03/08/12 0830  NA 140 136 139 150* 151* --  K 4.1 3.4* 3.2* 3.0* 3.5 --  CL 104 99 98 106 112 --  CO2 22 26 30  33* 27 --  GLUCOSE 145* 201* 193* 193* 150* --  BUN 36* 46* 49* 48* 51* --  CREATININE 2.14* 2.80* 3.44* 4.36* 5.52* --  CALCIUM 8.5 8.1* 8.1* 8.4 8.8 --  MG 1.8 1.3* -- -- -- --  PHOS -- -- -- -- -- 6.0*   Liver Function Tests:  Lab 03/10/12 0500 03/08/12 0830  AST 13 --  ALT 9 --  ALKPHOS 58 --  BILITOT 0.8 --  PROT 6.1 --  ALBUMIN 2.7* 3.1*   No results found for this basename: LIPASE:5,AMYLASE:5 in the last 168 hours No results found for this basename: AMMONIA:5 in the last 168 hours CBC:  Lab 03/13/12 0555 03/11/12 0622 03/10/12 0500 03/09/12 0520 03/07/12 0432  WBC 7.6 6.4 6.3 9.4 10.6*  NEUTROABS -- -- -- -- --  HGB 8.7* 8.9* 8.5* 9.3* 10.3*  HCT 24.5* 25.5* 24.9* 26.7* 29.1*  MCV 92.8 93.4 94.3 91.1 91.2  PLT 184 160 134* 166 161   Cardiac Enzymes: No results found for this basename: CKTOTAL:5,CKMB:5,CKMBINDEX:5,TROPONINI:5 in the last  168 hours CBG:  Lab 03/13/12 1133 03/13/12 0750 03/13/12 0440 03/12/12 2357 03/12/12 2006  GLUCAP 171* 117* 134* 127* 235*    Studies:  I have reviewed all Recent x-ray studies in detail Scheduled Meds:  I Reviewed all meds in detail.   Ramiro Harvest MD [facility] Office  [phone] Pager [phone]  On-Call/Text Page:      [email or web address]      password [email or web address]  If 7PM-7AM, please contact night-coverage [email or web address] Password [email or web address] 03/13/2012, 11:46 AM   LOS: 9 days

## 2012-03-13 NOTE — Progress Notes (Signed)
Physical Therapy Treatment Patient Details Name: Craig Henderson MRN: [id number] DOB: February 03, 1930 Today's Date: 03/13/2012 Time: 3086-5784 PT Time Calculation (min): 24 min  PT Assessment / Plan / Recommendation Comments on Treatment Session  Pt progressing well, more alert this session although still quiet with minimal verbalization. Pt required less assistance during mobility this session and no loss of balance. Plan to d/c to SNF prior to home for safety concerns    Follow Up Recommendations  SNF;Supervision/Assistance - 24 hour     Does the patient have the potential to tolerate intense rehabilitation     Barriers to Discharge        Equipment Recommendations  3 in 1 bedside comode    Recommendations for Other Services    Frequency Min 3X/week   Plan Discharge plan remains appropriate;Frequency remains appropriate    Precautions / Restrictions Precautions Precautions: Fall Restrictions Weight Bearing Restrictions: No   Pertinent Vitals/Pain No complaints of pain    Mobility  Bed Mobility Bed Mobility: Supine to Sit;Sitting - Scoot to Edge of Bed Supine to Sit: 4: Min guard;HOB elevated;With rails Sitting - Scoot to Edge of Bed: 4: Min guard;With rail Details for Bed Mobility Assistance: Increased time to complete. Minguard through trunk into sitting. Cueing for safe technique Transfers Transfers: Sit to Stand;Stand to Sit Sit to Stand: 4: Min assist;3: Mod assist;With upper extremity assist;From bed Stand to Sit: 4: Min assist;With upper extremity assist;To chair/3-in-1 Details for Transfer Assistance: Pt with attempt x 2 to stand as first time with loss of balance requiring sitting before full upright position. Cues for anterior translation into standing Ambulation/Gait Ambulation/Gait Assistance: 4: Min guard Ambulation Distance (Feet): 40 Feet Assistive device: Rolling walker Ambulation/Gait Assistance Details: Pt with no LOB this ambulation, cues for safe  technique and distance to RW.  Gait Pattern: Trunk flexed;Step-to pattern;Decreased hip/knee flexion - left;Decreased hip/knee flexion - right Gait velocity: decreased gait speed Stairs: No    Exercises General Exercises - Lower Extremity Long Arc Quad: AROM;Strengthening;Both;10 reps;Seated Hip Flexion/Marching: AROM;Strengthening;Both;10 reps;Seated   PT Diagnosis:    PT Problem List:   PT Treatment Interventions:     PT Goals Acute Rehab PT Goals PT Goal: Sit to Stand - Progress: Progressing toward goal PT Goal: Ambulate - Progress: Progressing toward goal  Visit Information  Last PT Received On: 03/13/12 Assistance Needed: +1    Subjective Data      Cognition  Overall Cognitive Status: Appears within functional limits for tasks assessed/performed Arousal/Alertness: Awake/alert Orientation Level: Disoriented to;Situation Behavior During Session: Flat affect Cognition - Other Comments: delayed response time, possible expressive difficulties, word finding difficulties    Balance     End of Session PT - End of Session Equipment Utilized During Treatment: Gait belt Activity Tolerance: Patient tolerated treatment well Patient left: in chair;with call bell/phone within reach;with family/visitor present;with chair alarm set Nurse Communication: Mobility status   GP     [name] 03/13/2012, 10:31 AM

## 2012-03-13 NOTE — Clinical Social Work Note (Signed)
Clinical Social Work   CSW met with pt's wife at bedside and explained the role of social work. Pt's wife shared that she would like a SNF in the [place] city limits, as she is limited to the distance she is comfortable driving. CSW will follow up with bed offers. CSW will continue to follow to facilitate discharge to SNF when medically stable.   Dede Query, MSW, Theresia Majors [phone]

## 2012-03-13 NOTE — Clinical Social Work Placement (Addendum)
    Clinical Social Work Department CLINICAL SOCIAL WORK PLACEMENT NOTE 03/13/2012  Patient:  Craig Henderson, Craig Henderson  Account Number:  [id number] Admit date:  03/04/2012  Clinical Social Worker:  Peggyann Shoals  Date/time:  03/12/2012 12:00 N  Clinical Social Work is seeking post-discharge placement for this patient at the following level of care:   SKILLED NURSING   (*CSW will update this form in Epic as items are completed)   03/13/2012  Patient/family provided with [facility] Department of Clinical Social Work's list of facilities offering this level of care within the geographic area requested by the patient (or if unable, by the patient's family).  03/13/2012  Patient/family informed of their freedom to choose among providers that offer the needed level of care, that participate in Medicare, Medicaid or managed care program needed by the patient, have an available bed and are willing to accept the patient.  03/13/2012  Patient/family informed of [facility]' ownership interest in [facility], as well as of the fact that they are under no obligation to receive care at this facility.  PASARR submitted to E[employer] on 03/13/2012 PASARR number received from [employer] on 03/13/2012  FL2 transmitted to all facilities in geographic area requested by pt/family on  03/13/2012 FL2 transmitted to all facilities within larger geographic area on   Patient informed that his/her managed care company has contracts with or will negotiate with  certain facilities, including the following:     Patient/family informed of bed offers received:  03/14/2012 Patient chooses bed at [facility] Physician recommends and patient chooses bed at  SNF  Patient to be transferred to [facility] on 03/17/2012 Patient to be transferred to facility by [facility]  The following physician request were entered in Epic:   Additional Comments:

## 2012-03-14 DIAGNOSIS — K219 Gastro-esophageal reflux disease without esophagitis: Secondary | ICD-10-CM | POA: Diagnosis present

## 2012-03-14 LAB — BASIC METABOLIC PANEL
Calcium: 8.9 mg/dL (ref 8.4–10.5)
Creatinine, Ser: 1.89 mg/dL — ABNORMAL HIGH (ref 0.50–1.35)
GFR calc Af Amer: 37 mL/min — ABNORMAL LOW (ref >90)
GFR calc non Af Amer: 32 mL/min — ABNORMAL LOW (ref >90)
Sodium: 140 mEq/L (ref 135–145)

## 2012-03-14 LAB — GLUCOSE, CAPILLARY
Glucose-Capillary: 129 mg/dL — ABNORMAL HIGH (ref 70–99)
Glucose-Capillary: 268 mg/dL — ABNORMAL HIGH (ref 70–99)

## 2012-03-14 NOTE — Progress Notes (Signed)
[facility] Progress Note [place] TEAM 4   Craig Henderson YNW:[id number] DOB: December 27, 1929 DOA: 03/04/2012 PCP: [name], MD  Brief narrative: 82/M with PMH of Prostate CA treated with XRT 8/13, DM, HTN was in his usual state of health 2-3 weeks ago. His family has noticed periods of disorientation over the last 2-3 weeks, he would say things that didn't make sense like thinking he was in [place], talking about breakfast at dinner time etc.  The day prior to his admit he went to visit his wife who is temporarily staying with her parents late at night which is very late for him as he is usually in bed by then.  The morning of his admit his wife got a call from the police saying that he was found confused in his car outside a church.  He was brought to ED by EMS, and was found to be hyperglycemic with CBG of 500 without an anion gap. His Blood glucose improved with Insulin gtt, but he remained agitated and confused.  Family denied Fevers, chills, N/V/Diarrhea, urinary symptoms, cough, congestion, SOB, or recent medication changes.  Assessment/Plan:  Altered mental status/acute encephalopathy CT scan revealed diffuse cortical atrophy but no acute abnormality - MRI was of poor quality due to patient motion but no acute insult/mass is appreciable - urinalysis was unrevealing at admission - urine drug screen, serum calcium, and ammonia levels were all unrevealing - B12, folate, cortisol, and TSH were normal - RPR was non-reactive - Neurology initially initiated acyclovir for possible HSV encephalitis, but LP HSV studies negative for HSV - on review of his home med bottles, I find that he is prescribed decadron "for joint pain" but pill counts suggest that he has not taken it in quite a while - I agree that his presenting idiopathic delirium has likely cleared, with a new delirium having developed due to his ARF and UTI - pt is much improved  (conversant and - not agitated or  combative)  Acute renal failure / urinary retention Urinary retention is likely the cause - acyclovir could also be contributing - acyclovir has been stopped - placed foley (blader scan revealed >700 cc retained urine) - renal US w/o acute findings - signif post-obstructive diuresis continues - crt continuing to slowly improve - cont to follow trend - keep IVF for now, follow and recheck.   Metabolic acidosis.  Due to above - resolved - bicarb 12/2 - follow   Hypernatremia due to hypovolemia w/ no oral intake and w/ post-obst diuresis - -resolved, continue current ivf for today, follow recheck and further treat accordingly  UTI/pyelonephritis Unfortunately, urine culture did not identify the offending pathogen - will complete 10 days of empiric tx (given complex hx of prostate CA)  Prostate cancer Neither CT scan nor MRI revealed evidence of brain masses  Diabetes mellitus - severely uncontrolled CBG much improved from the time of admission - continue SSI and lantus,  - of note, A1c markedly elevated at 10.9  Hypertension Blood pressure improved. Continue clonidine patch.  Hypercholesterolemia  Hypokalemia/hypomagnesemia Due to poor intake + IV bicarb -  bicarb dc'ed as above -replaced k. Replace magnesium.  Normocytic anemia ?due to poor nutrition - no clear evidence of blood loss - follow trend. Followup as outpatient.  Nutrition Continue  current diet  - if evidence of aspiration noted, will ask for SLP consult   Code Status: Full Family Communication: Updated patient. No family at bedside. Disposition Plan: SNF when medically ready  Consultants: Neurology: Dr Thad Ranger 03/04/2012  Procedures: LP 03/06/2012 - no clear evidence of infection/abnorm  Antibiotics: Acyclovir 03/04/2012 >>11/29 Rocephin 11/30 >>  DVT prophylaxis: Sub Q heparin  HPI/Subjective: Pt alert and oriented. No complaints.  Objective: Blood pressure 159/75, pulse 75, temperature 98.5 F  (36.9 C), temperature source Oral, resp. rate 18, height 5\' 8"  (1.727 m), weight 77.7 kg (171 lb 4.8 oz), SpO2 100.00%.  Intake/Output Summary (Last 24 hours) at 03/14/12 1812 Last data filed at 03/14/12 1722  Gross per 24 hour  Intake    560 ml  Output   1600 ml  Net  -1040 ml     Exam: General: No acute respiratory distress - much more alert Lungs: Clear to auscultation bilaterally without wheezes or crackles  Cardiovascular: regular rate and regular rhythm, no appreciable M Abdomen: Nontender, nondistended, soft, bowel sounds positive, no rebound, no ascites, no appreciable mass Extremities: No significant cyanosis, clubbing, or edema bilateral lower extremities NEURO: moving all 4 ext spontaneously, CNII-XII intact B, alert and oriented to place and person  Data Reviewed: Basic Metabolic Panel:  Lab 03/14/12 4540 03/13/12 0555 03/12/12 0626 03/11/12 0622 03/10/12 0500 03/08/12 0830  NA 140 140 136 139 150* --  K 3.5 4.1 3.4* 3.2* 3.0* --  CL 105 104 99 98 106 --  CO2 24 22 26 30  33* --  GLUCOSE 117* 145* 201* 193* 193* --  BUN 26* 36* 46* 49* 48* --  CREATININE 1.89* 2.14* 2.80* 3.44* 4.36* --  CALCIUM 8.9 8.5 8.1* 8.1* 8.4 --  MG -- 1.8 1.3* -- -- --  PHOS -- -- -- -- -- 6.0*   Liver Function Tests:  Lab 03/10/12 0500 03/08/12 0830  AST 13 --  ALT 9 --  ALKPHOS 58 --  BILITOT 0.8 --  PROT 6.1 --  ALBUMIN 2.7* 3.1*   No results found for this basename: LIPASE:5,AMYLASE:5 in the last 168 hours No results found for this basename: AMMONIA:5 in the last 168 hours CBC:  Lab 03/13/12 0555 03/11/12 0622 03/10/12 0500 03/09/12 0520  WBC 7.6 6.4 6.3 9.4  NEUTROABS -- -- -- --  HGB 8.7* 8.9* 8.5* 9.3*  HCT 24.5* 25.5* 24.9* 26.7*  MCV 92.8 93.4 94.3 91.1  PLT 184 160 134* 166   Cardiac Enzymes: No results found for this basename: CKTOTAL:5,CKMB:5,CKMBINDEX:5,TROPONINI:5 in the last 168 hours CBG:  Lab 03/14/12 1632 03/14/12 1139 03/14/12 0828 03/14/12 0404  03/13/12 2341  GLUCAP 268* 146* 129* 127* 127*    Studies:  I have reviewed all Recent x-ray studies in detail Scheduled Meds:  I Reviewed all meds in detail.   Ramiro Harvest MD [facility] Office  [phone] Pager [phone]  On-Call/Text Page:      [email or web address]      password [email or web address]  If 7PM-7AM, please contact night-coverage [email or web address] Password [email or web address] 03/14/2012, 6:12 PM   LOS: 10 days

## 2012-03-15 LAB — BASIC METABOLIC PANEL
Calcium: 9 mg/dL (ref 8.4–10.5)
Creatinine, Ser: 1.57 mg/dL — ABNORMAL HIGH (ref 0.50–1.35)
GFR calc non Af Amer: 40 mL/min — ABNORMAL LOW (ref >90)
Glucose, Bld: 222 mg/dL — ABNORMAL HIGH (ref 70–99)
Sodium: 139 mEq/L (ref 135–145)

## 2012-03-15 LAB — GLUCOSE, CAPILLARY
Glucose-Capillary: 118 mg/dL — ABNORMAL HIGH (ref 70–99)
Glucose-Capillary: 186 mg/dL — ABNORMAL HIGH (ref 70–99)

## 2012-03-15 MED ORDER — AMLODIPINE BESYLATE 5 MG PO TABS
5.0000 mg | ORAL_TABLET | Freq: Every day | ORAL | Status: DC
  Administered 2012-03-15 – 2012-03-17 (×3): 5 mg via ORAL
  Filled 2012-03-15 (×4): qty 1

## 2012-03-15 NOTE — Progress Notes (Signed)
[facility] Progress Note  TEAM 4   SERAPHIM TROW WUJ:[id number] DOB: Oct 27, 1929 DOA: 03/04/2012 PCP: [name], MD  Brief narrative: 82/M with PMH of Prostate CA treated with XRT 8/13, DM, HTN was in his usual state of health 2-3 weeks ago. His family has noticed periods of disorientation over the last 2-3 weeks, he would say things that didn't make sense like thinking he was in [place], talking about breakfast at dinner time etc.  The day prior to his admit he went to visit his wife who is temporarily staying with her parents late at night which is very late for him as he is usually in bed by then.  The morning of his admit his wife got a call from the police saying that he was found confused in his car outside a church.  He was brought to ED by EMS, and was found to be hyperglycemic with CBG of 500 without an anion gap. His Blood glucose improved with Insulin gtt, but he remained agitated and confused.  Family denied Fevers, chills, N/V/Diarrhea, urinary symptoms, cough, congestion, SOB, or recent medication changes.  Assessment/Plan:  Altered mental status/acute encephalopathy CT scan revealed diffuse cortical atrophy but no acute abnormality - MRI was of poor quality due to patient motion but no acute insult/mass is appreciable - urinalysis was unrevealing at admission - urine drug screen, serum calcium, and ammonia levels were all unrevealing - B12, folate, cortisol, and TSH were normal - RPR was non-reactive - Neurology initially initiated acyclovir for possible HSV encephalitis, but LP HSV studies negative for HSV - on review of his home med bottles, I find that he is prescribed decadron "for joint pain" but pill counts suggest that he has not taken it in quite a while - I agree that his presenting idiopathic delirium has likely cleared, with a new delirium having developed due to his ARF and UTI - pt is much improved  (conversant and - not agitated or combative). Per  family patient close to baseline. Follow.  Acute renal failure / urinary retention Urinary retention is likely the cause - acyclovir could also be contributing - acyclovir has been stopped - placed foley (blader scan revealed >700 cc retained urine) - renal US w/o acute findings - signif post-obstructive diuresis continues - crt continuing to slowly improve - cont to follow trend - keep IVF for now, follow and recheck.   Metabolic acidosis.  Due to above - resolved - bicarb 12/2 - follow   Hypernatremia due to hypovolemia w/ no oral intake and w/ post-obst diuresis - -resolved, continue current ivf for today, follow recheck and further treat accordingly  UTI/pyelonephritis Unfortunately, urine culture did not identify the offending pathogen - will complete 10 days of empiric tx (given complex hx of prostate CA)  Prostate cancer Neither CT scan nor MRI revealed evidence of brain masses  Diabetes mellitus - severely uncontrolled CBG much improved from the time of admission - continue SSI and lantus,  - of note, A1c markedly elevated at 10.9  Hypertension Blood pressure improved. Continue clonidine patch. Will start patient on Norvasc 5 mg daily.  Hypercholesterolemia  Hypokalemia/hypomagnesemia Due to poor intake + IV bicarb -  bicarb dc'ed as above -replaced k. Replace magnesium.  Normocytic anemia ?due to poor nutrition - no clear evidence of blood loss - follow trend. Followup as outpatient.  Nutrition Continue  current diet  - if evidence of aspiration noted, will ask for SLP consult   Code Status: Full  Family Communication: Updated patient. No family at bedside. Disposition Plan: SNF when medically ready  Consultants: Neurology: Dr Thad Ranger 03/04/2012  Procedures: LP 03/06/2012 - no clear evidence of infection/abnorm  Antibiotics: Acyclovir 03/04/2012 >>11/29 Rocephin 11/30 >>  DVT prophylaxis: Sub Q heparin  HPI/Subjective: Pt alert and oriented. No  complaints.  Objective: Blood pressure 175/79, pulse 75, temperature 98.6 F (37 C), temperature source Oral, resp. rate 18, height 5\' 8"  (1.727 m), weight 77.7 kg (171 lb 4.8 oz), SpO2 100.00%.  Intake/Output Summary (Last 24 hours) at 03/15/12 1919 Last data filed at 03/15/12 1827  Gross per 24 hour  Intake    825 ml  Output   4000 ml  Net  -3175 ml     Exam: General: No acute respiratory distress - much more alert Lungs: Clear to auscultation bilaterally without wheezes or crackles  Cardiovascular: regular rate and regular rhythm, no appreciable M Abdomen: Nontender, nondistended, soft, bowel sounds positive, no rebound, no ascites, no appreciable mass Extremities: No significant cyanosis, clubbing, or edema bilateral lower extremities NEURO: moving all 4 ext spontaneously, CNII-XII intact B, alert and oriented to place and person  Data Reviewed: Basic Metabolic Panel:  Lab 03/15/12 1914 03/14/12 0622 03/13/12 0555 03/12/12 0626 03/11/12 0622  NA 139 140 140 136 139  K 4.0 3.5 4.1 3.4* 3.2*  CL 104 105 104 99 98  CO2 22 24 22 26 30   GLUCOSE 222* 117* 145* 201* 193*  BUN 19 26* 36* 46* 49*  CREATININE 1.57* 1.89* 2.14* 2.80* 3.44*  CALCIUM 9.0 8.9 8.5 8.1* 8.1*  MG -- -- 1.8 1.3* --  PHOS -- -- -- -- --   Liver Function Tests:  Lab 03/10/12 0500  AST 13  ALT 9  ALKPHOS 58  BILITOT 0.8  PROT 6.1  ALBUMIN 2.7*   No results found for this basename: LIPASE:5,AMYLASE:5 in the last 168 hours No results found for this basename: AMMONIA:5 in the last 168 hours CBC:  Lab 03/13/12 0555 03/11/12 0622 03/10/12 0500 03/09/12 0520  WBC 7.6 6.4 6.3 9.4  NEUTROABS -- -- -- --  HGB 8.7* 8.9* 8.5* 9.3*  HCT 24.5* 25.5* 24.9* 26.7*  MCV 92.8 93.4 94.3 91.1  PLT 184 160 134* 166   Cardiac Enzymes: No results found for this basename: CKTOTAL:5,CKMB:5,CKMBINDEX:5,TROPONINI:5 in the last 168 hours CBG:  Lab 03/15/12 1610 03/15/12 1126 03/15/12 0836 03/15/12 0407 03/14/12  2344  GLUCAP 118* 180* 204* 111* 186*    Studies:  I have reviewed all Recent x-ray studies in detail Scheduled Meds:  I Reviewed all meds in detail.   Ramiro Harvest MD [facility] Office  [phone] Pager [phone]  On-Call/Text Page:      [email or web address]      password [email or web address]  If 7PM-7AM, please contact night-coverage [email or web address] Password [email or web address] 03/15/2012, 7:19 PM   LOS: 11 days

## 2012-03-16 LAB — BASIC METABOLIC PANEL
BUN: 18 mg/dL (ref 6–23)
CO2: 24 mEq/L (ref 19–32)
Chloride: 102 mEq/L (ref 96–112)
GFR calc non Af Amer: 40 mL/min — ABNORMAL LOW (ref >90)
Glucose, Bld: 222 mg/dL — ABNORMAL HIGH (ref 70–99)
Potassium: 3.8 mEq/L (ref 3.5–5.1)
Sodium: 138 mEq/L (ref 135–145)

## 2012-03-16 LAB — GLUCOSE, CAPILLARY
Glucose-Capillary: 138 mg/dL — ABNORMAL HIGH (ref 70–99)
Glucose-Capillary: 151 mg/dL — ABNORMAL HIGH (ref 70–99)
Glucose-Capillary: 204 mg/dL — ABNORMAL HIGH (ref 70–99)
Glucose-Capillary: 168 mg/dL — ABNORMAL HIGH (ref 70–99)
Glucose-Capillary: 202 mg/dL — ABNORMAL HIGH (ref 70–99)

## 2012-03-16 NOTE — Progress Notes (Signed)
[facility] Progress Note [place] TEAM 4   DEVINN HURWITZ Craig Henderson:[id number] DOB: 1930/01/07 DOA: 03/04/2012 PCP: [name], MD  Brief narrative: 82/M with PMH of Prostate CA treated with XRT 8/13, DM, HTN was in his usual state of health 2-3 weeks ago. His family has noticed periods of disorientation over the last 2-3 weeks, he would say things that didn't make sense like thinking he was in [place], talking about breakfast at dinner time etc.  The day prior to his admit he went to visit his wife who is temporarily staying with her parents late at night which is very late for him as he is usually in bed by then.  The morning of his admit his wife got a call from the police saying that he was found confused in his car outside a church.  He was brought to ED by EMS, and was found to be hyperglycemic with CBG of 500 without an anion gap. His Blood glucose improved with Insulin gtt, but he remained agitated and confused.  Family denied Fevers, chills, N/V/Diarrhea, urinary symptoms, cough, congestion, SOB, or recent medication changes.  Assessment/Plan:  Altered mental status/acute encephalopathy CT scan revealed diffuse cortical atrophy but no acute abnormality - MRI was of poor quality due to patient motion but no acute insult/mass is appreciable - urinalysis was unrevealing at admission - urine drug screen, serum calcium, and ammonia levels were all unrevealing - B12, folate, cortisol, and TSH were normal - RPR was non-reactive - Neurology initially initiated acyclovir for possible HSV encephalitis, but LP HSV studies negative for HSV - on review of his home med bottles, I find that he is prescribed decadron "for joint pain" but pill counts suggest that he has not taken it in quite a while - I agree that his presenting idiopathic delirium has likely cleared, with a new delirium having developed due to his ARF and UTI - pt is much improved  (conversant and - not agitated or combative). Per  family patient close to baseline. Follow.  Acute renal failure / urinary retention Urinary retention is likely the cause - acyclovir could also be contributing - acyclovir has been stopped - placed foley (blader scan revealed >700 cc retained urine) - renal US w/o acute findings - signif post-obstructive diuresis continues - crt continuing to slowly improve - cont to follow trend - keep IVF for now, follow and recheck.   Metabolic acidosis.  Due to above - resolved - bicarb 12/2 - follow   Hypernatremia due to hypovolemia w/ no oral intake and w/ post-obst diuresis - -resolved, continue current ivf for today, follow recheck and further treat accordingly  UTI/pyelonephritis Unfortunately, urine culture did not identify the offending pathogen - will complete 10 days of empiric tx (given complex hx of prostate CA)  Prostate cancer Neither CT scan nor MRI revealed evidence of brain masses  Diabetes mellitus - severely uncontrolled CBG much improved from the time of admission - continue SSI and lantus,  - of note, A1c markedly elevated at 10.9  Hypertension Blood pressure improved. Continue clonidine patch. Will start patient on Norvasc 5 mg daily.  Hypercholesterolemia  Hypokalemia/hypomagnesemia Due to poor intake + IV bicarb -  bicarb dc'ed as above -replaced k. Replace magnesium.  Normocytic anemia ?due to poor nutrition - no clear evidence of blood loss - follow trend. Followup as outpatient.  Nutrition Continue  current diet  - if evidence of aspiration noted, will ask for SLP consult   Code Status: Full  Family Communication: Updated patient. No family at bedside. Disposition Plan: SNF when medically ready  Consultants: Neurology: Dr Thad Ranger 03/04/2012  Procedures: LP 03/06/2012 - no clear evidence of infection/abnorm  Antibiotics: Acyclovir 03/04/2012 >>11/29 Rocephin 11/30 >>  DVT prophylaxis: Sub Q heparin  HPI/Subjective: Pt alert and oriented. No  complaints.  Objective: Blood pressure 156/85, pulse 78, temperature 97.6 F (36.4 C), temperature source Oral, resp. rate 18, height 5\' 8"  (1.727 m), weight 77.7 kg (171 lb 4.8 oz), SpO2 100.00%.  Intake/Output Summary (Last 24 hours) at 03/16/12 1108 Last data filed at 03/16/12 0758  Gross per 24 hour  Intake      0 ml  Output   3700 ml  Net  -3700 ml     Exam: General: No acute respiratory distress - much more alert Lungs: Clear to auscultation bilaterally without wheezes or crackles  Cardiovascular: regular rate and regular rhythm, no appreciable M Abdomen: Nontender, nondistended, soft, bowel sounds positive, no rebound, no ascites, no appreciable mass Extremities: No significant cyanosis, clubbing, or edema bilateral lower extremities NEURO: moving all 4 ext spontaneously, CNII-XII intact B, alert and oriented to place and person  Data Reviewed: Basic Metabolic Panel:  Lab 03/16/12 1478 03/15/12 0858 03/14/12 0622 03/13/12 0555 03/12/12 0626  NA 138 139 140 140 136  K 3.8 4.0 3.5 4.1 3.4*  CL 102 104 105 104 99  CO2 24 22 24 22 26   GLUCOSE 222* 222* 117* 145* 201*  BUN 18 19 26* 36* 46*  CREATININE 1.56* 1.57* 1.89* 2.14* 2.80*  CALCIUM 9.0 9.0 8.9 8.5 8.1*  MG -- -- -- 1.8 1.3*  PHOS -- -- -- -- --   Liver Function Tests:  Lab 03/10/12 0500  AST 13  ALT 9  ALKPHOS 58  BILITOT 0.8  PROT 6.1  ALBUMIN 2.7*   No results found for this basename: LIPASE:5,AMYLASE:5 in the last 168 hours No results found for this basename: AMMONIA:5 in the last 168 hours CBC:  Lab 03/13/12 0555 03/11/12 0622 03/10/12 0500  WBC 7.6 6.4 6.3  NEUTROABS -- -- --  HGB 8.7* 8.9* 8.5*  HCT 24.5* 25.5* 24.9*  MCV 92.8 93.4 94.3  PLT 184 160 134*   Cardiac Enzymes: No results found for this basename: CKTOTAL:5,CKMB:5,CKMBINDEX:5,TROPONINI:5 in the last 168 hours CBG:  Lab 03/16/12 0817 03/16/12 0348 03/15/12 2358 03/15/12 1947 03/15/12 1610  GLUCAP 154* 138* 151* 191* 118*     Studies:  I have reviewed all Recent x-ray studies in detail Scheduled Meds:  I Reviewed all meds in detail.   Ramiro Harvest MD [facility] Office  [phone] Pager [phone]  On-Call/Text Page:      [email or web address]      password [email or web address]  If 7PM-7AM, please contact night-coverage [email or web address] Password [email or web address] 03/16/2012, 11:08 AM   LOS: 12 days

## 2012-03-17 LAB — GLUCOSE, CAPILLARY
Glucose-Capillary: 97 mg/dL (ref 70–99)
Glucose-Capillary: 208 mg/dL — ABNORMAL HIGH (ref 70–99)

## 2012-03-17 LAB — BASIC METABOLIC PANEL
CO2: 24 mEq/L (ref 19–32)
Chloride: 107 mEq/L (ref 96–112)
GFR calc Af Amer: 51 mL/min — ABNORMAL LOW (ref >90)
Potassium: 4.5 mEq/L (ref 3.5–5.1)
Sodium: 139 mEq/L (ref 135–145)

## 2012-03-17 MED ORDER — INSULIN ASPART 100 UNIT/ML ~~LOC~~ SOLN: 0.0000 [IU] | SUBCUTANEOUS | Status: DC

## 2012-03-17 MED ORDER — ENSURE PUDDING PO PUDG: 1.0000 | Freq: Three times a day (TID) | ORAL | Status: DC

## 2012-03-17 MED ORDER — AMLODIPINE BESYLATE 5 MG PO TABS: 5.0000 mg | ORAL_TABLET | Freq: Every day | ORAL | Status: DC

## 2012-03-17 MED ORDER — CLONIDINE HCL 0.1 MG/24HR TD PTWK: 1.0000 | MEDICATED_PATCH | TRANSDERMAL | Status: DC

## 2012-03-17 MED ORDER — TAMSULOSIN HCL 0.4 MG PO CAPS: 0.4000 mg | ORAL_CAPSULE | Freq: Every day | ORAL | Status: DC

## 2012-03-17 MED ORDER — LEVOFLOXACIN 500 MG PO TABS: 500.0000 mg | ORAL_TABLET | Freq: Every day | ORAL | Status: DC

## 2012-03-17 MED ORDER — LEVOFLOXACIN 500 MG PO TABS
500.0000 mg | ORAL_TABLET | Freq: Once | ORAL | Status: AC
  Administered 2012-03-17: 500 mg via ORAL
  Filled 2012-03-17: qty 1

## 2012-03-17 MED ORDER — INSULIN GLARGINE 100 UNIT/ML ~~LOC~~ SOLN: 15.0000 [IU] | Freq: Every day | SUBCUTANEOUS | Status: DC

## 2012-03-17 MED ORDER — TAMSULOSIN HCL 0.4 MG PO CAPS
0.4000 mg | ORAL_CAPSULE | Freq: Every day | ORAL | Status: DC
  Filled 2012-03-17: qty 1

## 2012-03-17 MED ORDER — LEVOFLOXACIN 250 MG PO TABS: 250.0000 mg | ORAL_TABLET | Freq: Every day | ORAL | Status: DC

## 2012-03-17 MED ORDER — LEVOFLOXACIN 250 MG PO TABS: 250.0000 mg | ORAL_TABLET | Freq: Every day | ORAL | Status: AC

## 2012-03-17 MED ORDER — SODIUM CHLORIDE 0.9 % IV SOLN
INTRAVENOUS | Status: DC
  Administered 2012-03-17: 09:00:00 via INTRAVENOUS

## 2012-03-17 NOTE — Discharge Summary (Signed)
Physician Discharge Summary  ILIAN Henderson VWU:[id number] DOB: 11/27/1929 DOA: 03/04/2012  PCP: [name], MD  Admit date: 03/04/2012 Discharge date: 03/17/2012  Time spent: 65 minutes  Recommendations for Outpatient Follow-up:  1. Followup with Dr. [name] 1 week post discharge. On followup patient's urinary retention and need to be reassessed may consider a voiding trial as patient has been discharged with a Foley catheter and Flomax. 2. Followup with M.D. at the skilled nursing facility. Patient will need a basic metabolic profile done one week post discharge to followup on his electrolytes and renal function.  Discharge Diagnoses:  Principal Problem:  *Metabolic encephalopathy Active Problems:  Prostate cancer  Hypertension  Diabetes mellitus type 2, uncontrolled  HTN (hypertension)  Fever  Leukocytosis  Ileus with mild constipation  Acute renal failure (ARF)  Acute urinary retention  Hypomagnesemia   Discharge Condition: Stable and improved  Diet recommendation: Carb modified  Filed Weights   03/04/12 1614  Weight: 77.7 kg (171 lb 4.8 oz)    History of present illness:  Mr.Kindall is a 82/M with PMH of Prostate CA treated with XRT 8/13, DM, HTN was in his usual state of health 2-3 weeks ago. His family has noticed periods of disorientation over the last 2-3 weeks, he would say things that didn't make sense like thinking he was in [place], talking about breakfast at dinner time etc.  Yesterday he went to visit his wife who is temporarily staying with her parents late at night which is very late for him as he is usually in bed by then.  This morning his wife got a call from the police saying that he was found confused in his car outside a church, he was brought to ED by EMS.  Was found to be hyperglycemic with CBG of 500 without an anion gap.  His Blood glucose has improved with Insulin gtt, remains agitated and confused.  Family denies any Fevers, chills,  N/V/Diarrhea, urinary symptoms, cough, congestion, SoB  No recent medication changes      Hospital Course:  Altered mental status/acute encephalopathy  Patient was admitted to the hospital with confusion and altered mental status. He was found to have a CBC of 500 without anion gap. He was initially placed on insulin drip with improvement in his hyperglycemia. Patient was subsequently transitioned off the insulin drip and placed on Lantus as well as sliding scale insulin. CT scan revealed diffuse cortical atrophy but no acute abnormality - MRI was of poor quality due to patient motion but no acute insult/mass is appreciable - urinalysis was unrevealing at admission - urine drug screen, serum calcium, and ammonia levels were all unrevealing - B12, folate, cortisol, and TSH were normal - RPR was non-reactive - neurology consultation was obtained and patient was seen in consultation by Dr. Thad Ranger. Neurology initially initiated acyclovir for possible HSV encephalitis, but LP HSV studies negative for HSV. Acyclovir was subsequently d/c'd. - on review of his home med bottles, I find that he is prescribed decadron "for joint pain" but pill counts suggest that he has not taken it in quite a while. EEG which was also done was negative for any acute seizures. Patient was followed repeat urinalysis done did have a leukocytosis with 21-50 WBCs. Patient was empirically placed on IV Rocephin and monitored. - I agree that his presenting idiopathic delirium has likely cleared, with a new delirium having developed due to his ARF and UTI - pt is much improved (conversant and - not agitated or combative).  Patient improved clinically. A Foley catheter was placed for urinary retention and he was being treated for urinary retention and urinary tract infection. Patient improved clinically and was at his baseline by day of discharge. Urine cultures showed no growth to date. Patient will be discharged on 7 more days of oral  antibiotics to complete a two-week course of therapy. Patient will followup with M.D. at the skilled nursing facility. Per family patient close to baseline. Follow.  Acute renal failure / urinary retention  During the hospitalization patient was noted to go into acute renal failure.  Patient  was hydrated with IV fluids however with no significant improvement in his renal function. Patient's creatinine went up as high as 6.02. It was felt patient's acute renal failure was likely secondary to urinary retention in the setting of acyclovir.  Bladder scan which was done revealed greater than 700 cc of retained urine. The Foley catheter was subsequently placed with good urine output and good diuresis. Patient was maintained on IV antibiotics for his urinary tract infection and was followed. Patient's renal function improved on a daily basis with gentle hydration and Foley catheter placement. Renal ultrasound was done which showed normal-appearing kidneys with no obstruction. By day of discharge patient's creatinine was down to 1.45. Patient will be discharged to the skilled nursing facility with a Foley catheter and Flomax. Patient will need to followup with his urologist one week post discharge for further evaluation and possible voiding trial. Patient be discharged in stable and improved condition.  Metabolic acidosis.  During that transition patient was noted to have a metabolic acidosis patient was placed on some bicarbonate with resolution of her metabolic acidosis by day of discharge. This would need to be followed up as outpatient.  Hypernatremia  During hospitalization patient was noted to be hypernatremic. It was felt this was likely secondary to dehydration and hypovolemia. Patient was hydrated with IV fluids Foley catheter was also placed for his urinary retention. Patient's sodium levels improved and his hypernatremia had resolved by day of discharge.  UTI/pyelonephritis  Repeat urinalysis done during  this hospitalization during patient's workup revealed a urinary tract infection. Patient was placed empirically on IV Rocephin with clinical improvement. Urine cultures had no growth. Patient was however treated apparently was subsequently transitioned to oral Levaquin on discharge to complete one more week of antibiotic therapy to complete a two-week course of treatment. Patient will followup with his urologist as outpatient.  Poorly controlled type 2 diabetes On admission patient was noted to be hyperglycemic with CBGs in the 500s. Patient was initially placed on insulin during and subsequently transitioned to subcutaneous Lantus and sliding scale insulin. Hemoglobin A1c which was done came back elevated at 10.9. Patient blood glucose levels improved and will controlled on Lantus and sliding scale insulin. Patient's oral hypoglycemic agents were discontinued during the hospitalization and he be discharged to a skilled nursing facility on subcutaneous Lantus and sliding scale insulin. Patient will follow up with M.D. At the skilled nursing facility. Hypokalemia/hypomagnesemia During the hospitalization patient was noted to be hypokalemic with some hypomagnesemia. Patient's electrolytes were repleted and had resolved by day of discharge.  The rest of patient's chronic medical issues remained stable throughout the hospitalization the patient be discharged in stable and improved condition.     Procedures: LP 03/06/2012 - no clear evidence of infection/abnorm   Consultations:  Neurology: Dr. Thad Ranger 03/04/2012  Discharge Exam: Filed Vitals:   03/16/12 1435 03/17/12 0100 03/17/12 0512 03/17/12 1002  BP: 140/67  135/69 128/52 131/66  Pulse: 74 74 69 66  Temp: 97.8 F (36.6 C) 97.1 F (36.2 C) 98 F (36.7 C) 98 F (36.7 C)  TempSrc: Oral Oral Oral Oral  Resp: 17 18 18 18   Height:      Weight:      SpO2: 100% 100% 100% 100%    General: NAD Cardiovascular: RRR Respiratory:  CTAB  Discharge Instructions  Discharge Orders    Future Orders Please Complete By Expires   Diet Carb Modified      Increase activity slowly      Discharge instructions      Comments:   Followup with Dr. [name] of urology in one week at which time a voiding trial may be done per Dr. [name] and further management of his urinary retention. Followup with M.D. at the skilled nursing facility. Will discharge with Foley catheter.       Medication List     As of 03/17/2012 11:39 AM    STOP taking these medications         amLODipine-benazepril 10-40 MG per capsule   Commonly known as: LOTREL      etodolac 400 MG tablet   Commonly known as: LODINE      glyBURIDE-metformin 5-500 MG per tablet   Commonly known as: GLUCOVANCE      TAKE these medications         amLODipine 5 MG tablet   Commonly known as: NORVASC   Take 1 tablet (5 mg total) by mouth daily.      cloNIDine 0.1 mg/24hr patch   Commonly known as: CATAPRES - Dosed in mg/24 hr   Place 1 patch (0.1 mg total) onto the skin once a week.      feeding supplement Pudg   Take 1 Container by mouth 3 (three) times daily between meals.      insulin aspart 100 UNIT/ML injection   Commonly known as: novoLOG   Inject 0-20 Units into the skin every 4 (four) hours. Sliding scale insulin.  CBGs are:  121-150 give 3 units  151-200 give 4 units  201 -250 give 7 units  251-300 give 10 units  301 -350 give 15 units  351-400 give 20 units      insulin glargine 100 UNIT/ML injection   Commonly known as: LANTUS   Inject 15 Units into the skin daily.      levofloxacin 250 MG tablet   Commonly known as: LEVAQUIN   Take 1 tablet (250 mg total) by mouth daily. Take 1 tablet daily x7 days.      multivitamin-lutein Caps   Take 1 capsule by mouth daily.      Tamsulosin HCl 0.4 MG Caps   Commonly known as: FLOMAX   Take 1 capsule (0.4 mg total) by mouth daily after supper.           Follow-up Information    Follow up  with NESI,MARC-HENRY, MD. Schedule an appointment as soon as possible for a visit in 1 week.   Contact information:   [address] [place] [address] [phone]       Please follow up. (Followup with M.D. at the skilled nursing facility)           The results of significant diagnostics from this hospitalization (including imaging, microbiology, ancillary and laboratory)  are listed below for reference.    Significant Diagnostic Studies: Ct Head Wo Contrast  03/04/2012  *RADIOLOGY REPORT*  Clinical Data: Altered mental status.  CT HEAD WITHOUT CONTRAST  Technique:  Contiguous axial images were obtained from the base of the skull through the vertex without contrast.  Comparison: None.  Findings: Mild diffuse cortical atrophy is noted.  Bony calvarium is intact.  Mild chronic ischemic white matter disease is noted. No mass effect or midline shift is noted.  Ventricular size is within normal limits.  There is no evidence of mass lesion, hemorrhage or acute infarction.  IMPRESSION: Chronic ischemic white matter disease.  Diffuse cortical atrophy. No acute intracranial abnormality seen.   Original Report Authenticated By: Lupita Raider.,  M.D.    Mr Brain Wo Contrast  03/05/2012  *RADIOLOGY REPORT*  Clinical Data: Mental status change.  MRI HEAD WITHOUT CONTRAST  Technique:  Multiplanar, multiecho pulse sequences of the brain and surrounding structures were obtained according to standard protocol without intravenous contrast.  Comparison: CT 03/04/2012  Findings: Incomplete examination.  The patient was sedated but not cooperative.  Axial diffusion T2 and FLAIR imaging was  performed. The patient was moving and  image quality degraded by motion.  No large territory acute infarct.  A small infarct could be missed on the study due to poor image quality.  There is atrophy and chronic microvascular ischemic change in the white matter. Ventricle size is  normal.  No mass lesion is identified.  IMPRESSION: Incomplete study with motion degraded images.  No acute infarct is identified.  Repeat study is suggested at a later time when the patient is able to cooperate.   Original Report Authenticated By: Janeece Riggers, M.D.    US Renal Port  03/08/2012  *RADIOLOGY REPORT*  Clinical Data: Acute renal failure.  RENAL/URINARY TRACT ULTRASOUND COMPLETE  Comparison:  None.  Findings:  Right Kidney:  Normal.  12.2 cm in length.  Left Kidney:  Normal.  10.8 cm in length.  Bladder:  The bladder is empty with a Foley catheter in place.  IMPRESSION: Normal appearing kidneys.  No obstruction.   Original Report Authenticated By: Francene Boyers, M.D.    Dg Chest Port 1 View  03/04/2012  *RADIOLOGY REPORT*  Clinical Data: Hyperglycemia.  PORTABLE CHEST - 1 VIEW  Comparison: Chest radiograph performed 03/28/2009  Findings: The lungs are relatively well-aerated; pulmonary vascularity is at the upper limits of normal.  There is no evidence of focal opacification, pleural effusion or pneumothorax.  The cardiomediastinal silhouette is within normal limits.  No acute osseous abnormalities are seen.  IMPRESSION: No acute cardiopulmonary process seen.   Original Report Authenticated By: Tonia Ghent, M.D.    Dg Abd Acute W/chest  03/04/2012  *RADIOLOGY REPORT*  Clinical Data: Abdominal pain.  ACUTE ABDOMEN SERIES (ABDOMEN 2 VIEW & CHEST 1 VIEW)  Comparison: Chest x-ray 03/04/2012.  Findings: The upright chest x-ray demonstrates no acute cardiopulmonary findings.  Two views of the abdomen demonstrate a moderate air and stool throughout the colon and down into the rectum.  There are also air filled loops of small bowel without significant distention or significant air-fluid levels.  No free air.  Findings could be due to ileus or gastroenteritis.  No findings for obstruction or perforation.  The soft tissue shadows are grossly maintained. Aortic calcifications are noted.  The bony  structures are intact.  IMPRESSION:  1.  No acute cardiopulmonary findings. 2.  Bowel gas pattern may suggest an ileus or  gastroenteritis.  No findings for obstruction or perforation.   Original Report Authenticated By: Rudie Meyer, M.D.     Microbiology: Recent Results (from the past 240 hour(s))  URINE CULTURE     Status: Normal   Collection Time   03/08/12 12:43 PM      Component Value Range Status Comment   Specimen Description URINE, CLEAN CATCH   Final    Special Requests NONE   Final    Culture  Setup Time 03/08/2012 18:20   Final    Colony Count NO GROWTH   Final    Culture NO GROWTH   Final    Report Status 03/09/2012 FINAL   Final      Labs: Basic Metabolic Panel:  Lab 03/17/12 3086 03/16/12 0915 03/15/12 5784 03/14/12 0622 03/13/12 0555 03/12/12 0626  NA 139 138 139 140 140 --  K 4.5 3.8 4.0 3.5 4.1 --  CL 107 102 104 105 104 --  CO2 24 24 22 24 22  --  GLUCOSE 121* 222* 222* 117* 145* --  BUN 20 18 19  26* 36* --  CREATININE 1.45* 1.56* 1.57* 1.89* 2.14* --  CALCIUM 8.9 9.0 9.0 8.9 8.5 --  MG -- -- -- -- 1.8 1.3*  PHOS -- -- -- -- -- --   Liver Function Tests: No results found for this basename: AST:5,ALT:5,ALKPHOS:5,BILITOT:5,PROT:5,ALBUMIN:5 in the last 168 hours No results found for this basename: LIPASE:5,AMYLASE:5 in the last 168 hours No results found for this basename: AMMONIA:5 in the last 168 hours CBC:  Lab 03/13/12 0555 03/11/12 0622  WBC 7.6 6.4  NEUTROABS -- --  HGB 8.7* 8.9*  HCT 24.5* 25.5*  MCV 92.8 93.4  PLT 184 160   Cardiac Enzymes: No results found for this basename: CKTOTAL:5,CKMB:5,CKMBINDEX:5,TROPONINI:5 in the last 168 hours BNP: BNP (last 3 results) No results found for this basename: PROBNP:3 in the last 8760 hours CBG:  Lab 03/17/12 0923 03/17/12 0410 03/17/12 0044 03/16/12 2022 03/16/12 1637  GLUCAP 169* 97 135* 202* 168*       Signed:  Shanyiah Conde  [facility] 03/17/2012, 11:39 AM

## 2012-03-17 NOTE — Progress Notes (Signed)
Physical Therapy Treatment Patient Details Name: Craig Henderson MRN: [id number] DOB: 04/27/1929 Today's Date: 03/17/2012 Time: 4098-1191 PT Time Calculation (min): 16 min  PT Assessment / Plan / Recommendation Comments on Treatment Session  Pt progressing well. Remains to require 24/7 supervision due to increased fall risk. Con't to benefit from ST-SNF to achieve max functional recovery prior to returning home.    Follow Up Recommendations  SNF;Supervision/Assistance - 24 hour     Does the patient have the potential to tolerate intense rehabilitation     Barriers to Discharge        Equipment Recommendations  None recommended by PT    Recommendations for Other Services    Frequency Min 3X/week   Plan Discharge plan remains appropriate;Frequency remains appropriate    Precautions / Restrictions Precautions Precautions: Fall Restrictions Weight Bearing Restrictions: No   Pertinent Vitals/Pain 0/10 pain    Mobility  Bed Mobility Bed Mobility: Supine to Sit;Sit to Supine;Sitting - Scoot to Edge of Bed Supine to Sit: 5: Supervision;HOB flat Sitting - Scoot to Edge of Bed: 5: Supervision;With rail Sit to Supine: 4: Min assist;HOB flat (assist for LEs) Details for Bed Mobility Assistance: increase time but able to complete task without labored effort Transfers Transfers: Sit to Stand;Stand to Sit Sit to Stand: 4: Min guard;With upper extremity assist;From bed Stand to Sit: 4: Min guard;With upper extremity assist;To bed Details for Transfer Assistance: increased time required, bed raised Ambulation/Gait Ambulation/Gait Assistance: 4: Min guard Ambulation Distance (Feet): 150 Feet Assistive device: Rolling walker Ambulation/Gait Assistance Details: increased trunk flexion despite v/c's Gait Pattern: Trunk flexed;Step-to pattern;Decreased hip/knee flexion - left;Decreased hip/knee flexion - right Gait velocity: decreased gait speed Stairs: No    Exercises     PT  Diagnosis:    PT Problem List:   PT Treatment Interventions:     PT Goals Acute Rehab PT Goals PT Goal: Sit to Stand - Progress: Progressing toward goal PT Goal: Ambulate - Progress: Progressing toward goal  Visit Information  Last PT Received On: 03/17/12 Assistance Needed: +1    Subjective Data  Subjective: Pt received in bed with report "I'm suppose to leave today."   Cognition  Overall Cognitive Status: Appears within functional limits for tasks assessed/performed Arousal/Alertness: Awake/alert Orientation Level: Appears intact for tasks assessed Behavior During Session: Flat affect    Balance     End of Session PT - End of Session Equipment Utilized During Treatment: Gait belt Activity Tolerance: Patient tolerated treatment well Patient left: in bed;with call bell/phone within reach;with family/visitor present Nurse Communication: Mobility status   GP     Marcene Brawn 03/17/2012, 4:02 PM  Lewis Shock, PT, DPT Pager #: [phone] Office #: [phone]

## 2012-03-17 NOTE — Clinical Social Work Note (Signed)
Clinical Social Work   Pt is ready for discharge to [facility]. Facility has received discharge summary and is ready to admit pt. Pt and pt's wife is agreeable to discharge plan. Pt has completed admission paperwork. [facility] will provide transportation to facility. CSW is signing off as no further needs identified.   Dede Query, MSW, [facility] [phone]

## 2012-03-17 NOTE — Progress Notes (Signed)
Pt education and discharge teaching complete. IV removed. Report called to RN at [facility]. Pt family notified. Pt stable and awaiting transport to SNF.

## 2012-11-10 ENCOUNTER — Encounter: Payer: Self-pay | Admitting: Diagnostic Neuroimaging

## 2012-11-10 ENCOUNTER — Ambulatory Visit (INDEPENDENT_AMBULATORY_CARE_PROVIDER_SITE_OTHER): Payer: Medicare Other | Admitting: Diagnostic Neuroimaging

## 2012-11-10 VITALS — BP 168/75 | HR 72 | Temp 98.1°F | Ht 67.0 in | Wt 183.0 lb

## 2012-11-10 DIAGNOSIS — F0390 Unspecified dementia without behavioral disturbance: Secondary | ICD-10-CM

## 2012-11-10 MED ORDER — DONEPEZIL HCL 10 MG PO TABS: 10.0000 mg | ORAL_TABLET | Freq: Every day | ORAL | Status: DC

## 2012-11-10 MED ORDER — DONEPEZIL HCL 5 MG PO TABS: 5.0000 mg | ORAL_TABLET | Freq: Every day | ORAL | Status: DC
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
## 2012-11-10 NOTE — Patient Instructions (Signed)
Start donepezil 5 mg at bedtime for one month. Then increase to 10 mg at bedtime.

## 2012-11-11 LAB — TSH: TSH: 4.84 u[IU]/mL — ABNORMAL HIGH (ref 0.450–4.500)

## 2012-11-12 ENCOUNTER — Other Ambulatory Visit: Payer: Self-pay | Admitting: Orthopedic Surgery

## 2012-11-12 ENCOUNTER — Ambulatory Visit
Admission: RE | Admit: 2012-11-12 | Discharge: 2012-11-12 | Disposition: A | Discharge status: Home / Self Care | Payer: Medicare Other | Source: Ambulatory Visit | Attending: Orthopedic Surgery | Admitting: Orthopedic Surgery

## 2012-11-12 DIAGNOSIS — M25561 Pain in right knee: Secondary | ICD-10-CM

## 2012-11-12 DIAGNOSIS — M25661 Stiffness of right knee, not elsewhere classified: Secondary | ICD-10-CM

## 2012-11-18 ENCOUNTER — Encounter: Payer: Self-pay | Admitting: Diagnostic Neuroimaging

## 2012-11-20 ENCOUNTER — Telehealth: Payer: Self-pay | Admitting: *Deleted

## 2012-11-20 NOTE — Telephone Encounter (Signed)
I called and gave results of TSH to pt and instructed to f/u with Dr. Parke Simmers for additional tests if needed.   Verbalized understanding.

## 2012-11-25 DIAGNOSIS — F0390 Unspecified dementia without behavioral disturbance: Secondary | ICD-10-CM

## 2012-11-26 ENCOUNTER — Other Ambulatory Visit: Payer: Self-pay | Admitting: Neurology

## 2012-11-26 DIAGNOSIS — F0390 Unspecified dementia without behavioral disturbance: Secondary | ICD-10-CM

## 2013-01-03 ENCOUNTER — Other Ambulatory Visit: Payer: Self-pay | Admitting: Diagnostic Neuroimaging

## 2013-01-25 ENCOUNTER — Emergency Department (HOSPITAL_COMMUNITY)
Admission: EM | Admit: 2013-01-25 | Discharge: 2013-01-25 | Disposition: A | Discharge status: Home / Self Care | Payer: Medicare Other | Attending: Emergency Medicine | Admitting: Emergency Medicine

## 2013-01-25 ENCOUNTER — Emergency Department (HOSPITAL_COMMUNITY): Payer: Medicare Other

## 2013-01-25 DIAGNOSIS — F0390 Unspecified dementia without behavioral disturbance: Secondary | ICD-10-CM | POA: Insufficient documentation

## 2013-01-25 DIAGNOSIS — Z794 Long term (current) use of insulin: Secondary | ICD-10-CM | POA: Insufficient documentation

## 2013-01-25 DIAGNOSIS — E7800 Pure hypercholesterolemia, unspecified: Secondary | ICD-10-CM | POA: Insufficient documentation

## 2013-01-25 DIAGNOSIS — Z8546 Personal history of malignant neoplasm of prostate: Secondary | ICD-10-CM | POA: Insufficient documentation

## 2013-01-25 DIAGNOSIS — E119 Type 2 diabetes mellitus without complications: Secondary | ICD-10-CM | POA: Insufficient documentation

## 2013-01-25 DIAGNOSIS — D892 Hypergammaglobulinemia, unspecified: Secondary | ICD-10-CM | POA: Insufficient documentation

## 2013-01-25 DIAGNOSIS — Z923 Personal history of irradiation: Secondary | ICD-10-CM | POA: Insufficient documentation

## 2013-01-25 DIAGNOSIS — Z79899 Other long term (current) drug therapy: Secondary | ICD-10-CM | POA: Insufficient documentation

## 2013-01-25 DIAGNOSIS — I4892 Unspecified atrial flutter: Secondary | ICD-10-CM

## 2013-01-25 DIAGNOSIS — R42 Dizziness and giddiness: Secondary | ICD-10-CM | POA: Insufficient documentation

## 2013-01-25 DIAGNOSIS — I10 Essential (primary) hypertension: Secondary | ICD-10-CM | POA: Insufficient documentation

## 2013-01-25 DIAGNOSIS — N400 Enlarged prostate without lower urinary tract symptoms: Secondary | ICD-10-CM | POA: Insufficient documentation

## 2013-01-25 DIAGNOSIS — N529 Male erectile dysfunction, unspecified: Secondary | ICD-10-CM | POA: Insufficient documentation

## 2013-01-25 DIAGNOSIS — Z87891 Personal history of nicotine dependence: Secondary | ICD-10-CM | POA: Insufficient documentation

## 2013-01-25 LAB — URINALYSIS, ROUTINE W REFLEX MICROSCOPIC
Ketones, ur: NEGATIVE mg/dL
Leukocytes, UA: NEGATIVE
Nitrite: NEGATIVE
Protein, ur: NEGATIVE mg/dL
Specific Gravity, Urine: 1.013 (ref 1.005–1.030)

## 2013-01-25 LAB — BASIC METABOLIC PANEL
BUN: 14 mg/dL (ref 6–23)
CO2: 23 mEq/L (ref 19–32)
Chloride: 101 mEq/L (ref 96–112)
Creatinine, Ser: 1.12 mg/dL (ref 0.50–1.35)
GFR calc Af Amer: 69 mL/min — ABNORMAL LOW (ref >90)
Glucose, Bld: 326 mg/dL — ABNORMAL HIGH (ref 70–99)
Potassium: 4 mEq/L (ref 3.5–5.1)

## 2013-01-25 LAB — GLUCOSE, CAPILLARY: Glucose-Capillary: 273 mg/dL — ABNORMAL HIGH (ref 70–99)

## 2013-01-25 LAB — CBC WITH DIFFERENTIAL/PLATELET
Basophils Absolute: 0 10*3/uL (ref 0.0–0.1)
HCT: 35.5 % — ABNORMAL LOW (ref 39.0–52.0)
Hemoglobin: 12.4 g/dL — ABNORMAL LOW (ref 13.0–17.0)
Lymphocytes Relative: 9 % — ABNORMAL LOW (ref 12–46)
Monocytes Absolute: 0.4 10*3/uL (ref 0.1–1.0)
Monocytes Relative: 5 % (ref 3–12)
Neutro Abs: 7.5 10*3/uL (ref 1.7–7.7)
Platelets: 205 10*3/uL (ref 150–400)
RDW: 12.9 % (ref 11.5–15.5)
WBC: 8.8 10*3/uL (ref 4.0–10.5)

## 2013-01-25 LAB — RAPID URINE DRUG SCREEN, HOSP PERFORMED
Benzodiazepines: NOT DETECTED
Opiates: NOT DETECTED
Tetrahydrocannabinol: NOT DETECTED

## 2013-01-25 LAB — PROTIME-INR
INR: 1.09 (ref 0.00–1.49)
Prothrombin Time: 13.9 seconds (ref 11.6–15.2)

## 2013-01-25 LAB — APTT: aPTT: 27 seconds (ref 24–37)

## 2013-01-25 LAB — TSH: TSH: 5.332 u[IU]/mL — ABNORMAL HIGH (ref 0.350–4.500)

## 2013-01-25 MED ORDER — SODIUM CHLORIDE 0.9 % IV BOLUS (SEPSIS)
500.0000 mL | Freq: Once | INTRAVENOUS | Status: AC
  Administered 2013-01-25: 500 mL via INTRAVENOUS

## 2013-01-25 NOTE — ED Notes (Signed)
Old and New EKG given to Dr Pickering 

## 2013-01-25 NOTE — ED Notes (Signed)
CBG checked 273

## 2013-01-25 NOTE — ED Provider Notes (Signed)
CSN: [id number]     Arrival date & time 01/25/13  0930 History   First MD Initiated Contact with Patient 01/25/13 0940     Chief Complaint  Patient presents with  . Irregular Heart Beat  . Weakness   (Consider location/radiation/quality/duration/timing/severity/associated sxs/prior Treatment) Patient is a 79 y.o. male presenting with weakness.  Weakness Pertinent negatives include no chest pain, no abdominal pain, no headaches and no shortness of breath.   patient states that he was at church sitting down and began to feel weak. No chest pain. No heart racing. No difficulty breathing. No nausea or vomiting. No confusion. His been doing well the last few days.  Past Medical History  Diagnosis Date  . Prostate cancer 07/18/11    bx=adenocarcinoma=PSA=12.45,gleason=4+5=9,4+3=7,4+4=8, 3+3=6 & 3+7,volume=156.35cc  . Diabetes mellitus   . Hypertension   . Hypercholesterolemia   . BPH (benign prostatic hyperplasia)     with nocturia,  . ED (erectile dysfunction)   . Hx of radiation therapy 10/23/11 - 12/18/11    prostate, seminal vesicles, lymph nodes  . Mild dementia    Past Surgical History  Procedure Laterality Date  . Prostate biopsy  07/18/2011    gleason 4+5=9  . Rotator cuff repair  2011  . Fracture surgery  35 years    right leg/rod placement   Family History  Problem Relation Age of Onset  . Pneumonia Mother   . Cancer Father    History  Substance Use Topics  . Smoking status: Former Smoker -- 1.00 packs/day for 10 years    Types: Cigarettes    Quit date: 09/04/1972  . Smokeless tobacco: Never Used     Comment: quit 20 years ago  . Alcohol Use: 0.0 oz/week     Comment: occasionally    Review of Systems  Constitutional: Negative for activity change and appetite change.  Eyes: Negative for pain.  Respiratory: Negative for chest tightness and shortness of breath.   Cardiovascular: Negative for chest pain and leg swelling.  Gastrointestinal: Negative for nausea,  vomiting, abdominal pain and diarrhea.  Genitourinary: Negative for flank pain.  Musculoskeletal: Negative for back pain and neck stiffness.  Skin: Negative for rash.  Neurological: Positive for weakness. Negative for numbness and headaches.  Psychiatric/Behavioral: Negative for behavioral problems.    Allergies  Review of patient's allergies indicates no known allergies.  Home Medications   Current Outpatient Rx  Name  Route  Sig  Dispense  Refill  . amLODipine (NORVASC) 5 MG tablet   Oral   Take 5 mg by mouth daily.         Marland Kitchen donepezil (ARICEPT) 10 MG tablet   Oral   Take 10 mg by mouth at bedtime.         . ENSURE (ENSURE)   Oral   Take 237 mLs by mouth as needed.         . insulin aspart (NOVOLOG) 100 UNIT/ML injection   Subcutaneous   Inject 0-20 Units into the skin every 4 (four) hours. Sliding scale insulin. CBGs are: 121-150 give 3 units 151-200 give 4 units 201 -250 give 7 units 251-300 give 10 units 301 -350 give 15 units 351-400 give 20 units   1 vial   0   . multivitamin-lutein (OCUVITE-LUTEIN) CAPS   Oral   Take 1 capsule by mouth daily.         . pravastatin (PRAVACHOL) 80 MG tablet   Oral   Take 80 mg by mouth daily.         Marland Kitchen  sitaGLIPtin (JANUVIA) 100 MG tablet   Oral   Take 100 mg by mouth daily.         . Travoprost, BAK Free, (TRAVATAN) 0.004 % SOLN ophthalmic solution   Both Eyes   Place 1 drop into both eyes at bedtime.          BP 153/92  Pulse 74  Temp(Src) 98.1 F (36.7 C) (Oral)  Resp 16  SpO2 99% Physical Exam  Nursing note and vitals reviewed. Constitutional: He is oriented to person, place, and time. He appears well-developed and well-nourished.  HENT:  Head: Normocephalic and atraumatic.  Eyes: EOM are normal. Pupils are equal, round, and reactive to light.  Neck: Normal range of motion. Neck supple.  Cardiovascular: Normal rate, regular rhythm and normal heart sounds.   No murmur  heard. Pulmonary/Chest: Effort normal and breath sounds normal.  Abdominal: Soft. Bowel sounds are normal. He exhibits no distension and no mass. There is no tenderness. There is no rebound and no guarding.  Musculoskeletal: Normal range of motion. He exhibits no edema.  Neurological: He is alert and oriented to person, place, and time. No cranial nerve deficit.  Skin: Skin is warm and dry.  Psychiatric: He has a normal mood and affect.    ED Course  Procedures (including critical care time) Labs Review Labs Reviewed  CBC WITH DIFFERENTIAL - Abnormal; Notable for the following:    RBC 3.98 (*)    Hemoglobin 12.4 (*)    HCT 35.5 (*)    Neutrophils Relative % 86 (*)    Lymphocytes Relative 9 (*)    All other components within normal limits  BASIC METABOLIC PANEL - Abnormal; Notable for the following:    Glucose, Bld 326 (*)    GFR calc non Af Amer 60 (*)    GFR calc Af Amer 69 (*)    All other components within normal limits  URINALYSIS, ROUTINE W REFLEX MICROSCOPIC - Abnormal; Notable for the following:    Glucose, UA 100 (*)    All other components within normal limits  GLUCOSE, CAPILLARY - Abnormal; Notable for the following:    Glucose-Capillary 273 (*)    All other components within normal limits  GLUCOSE, CAPILLARY - Abnormal; Notable for the following:    Glucose-Capillary 260 (*)    All other components within normal limits  PROTIME-INR  APTT  URINE RAPID DRUG SCREEN (HOSP PERFORMED)  TSH   Imaging Review Dg Chest 2 View  01/25/2013   CLINICAL DATA:  Weakness  EXAM: CHEST  2 VIEW  COMPARISON:  03/04/2012  FINDINGS: Upper normal heart size. No pleural effusion or pneumothorax. Low volumes.  IMPRESSION: No active cardiopulmonary disease.   Electronically Signed   By: Maryclare Bean M.D.   On: 01/25/2013 11:05    EKG Interpretation   None      Date: 01/25/2013  Rate: 81  Rhythm: atrial flutter  QRS Axis: normal  Intervals: atrial flutter  ST/T Wave abnormalities:  normal  Conduction Disutrbances:none  Narrative Interpretation: aflutter is new  Old EKG Reviewed: changes noted    MDM   1. Atrial flutter    Patient with episode of lightheadedness at church. EKG showed new atrial flutter. Lab work overall reassuring. Discussed with cardiology who recommends outpatient followup for start of anticoagulation and that it does not need to be started in the ED. They have taken this information will give him a call.    Juliet Rude. Rubin Payor, MD 01/25/13 [id number]

## 2013-01-25 NOTE — ED Notes (Signed)
Pt knows that urine is needed 

## 2013-01-25 NOTE — ED Notes (Signed)
PT was in church this AM and reported a sudden onset of weakness ,diaphioorsis the patient denies Cp during event. Church members gave PT 324 of ASA po .

## 2013-01-30 ENCOUNTER — Emergency Department (HOSPITAL_COMMUNITY)
Admission: EM | Admit: 2013-01-30 | Discharge: 2013-01-30 | Disposition: A | Discharge status: Home / Self Care | Payer: Medicare Other | Attending: Emergency Medicine | Admitting: Emergency Medicine

## 2013-01-30 ENCOUNTER — Encounter (HOSPITAL_COMMUNITY): Payer: Self-pay | Admitting: Emergency Medicine

## 2013-01-30 DIAGNOSIS — E7800 Pure hypercholesterolemia, unspecified: Secondary | ICD-10-CM | POA: Insufficient documentation

## 2013-01-30 DIAGNOSIS — E119 Type 2 diabetes mellitus without complications: Secondary | ICD-10-CM | POA: Insufficient documentation

## 2013-01-30 DIAGNOSIS — Z8546 Personal history of malignant neoplasm of prostate: Secondary | ICD-10-CM | POA: Insufficient documentation

## 2013-01-30 DIAGNOSIS — Z923 Personal history of irradiation: Secondary | ICD-10-CM | POA: Insufficient documentation

## 2013-01-30 DIAGNOSIS — Z87448 Personal history of other diseases of urinary system: Secondary | ICD-10-CM | POA: Insufficient documentation

## 2013-01-30 DIAGNOSIS — R002 Palpitations: Secondary | ICD-10-CM | POA: Insufficient documentation

## 2013-01-30 DIAGNOSIS — I10 Essential (primary) hypertension: Secondary | ICD-10-CM | POA: Insufficient documentation

## 2013-01-30 DIAGNOSIS — I4891 Unspecified atrial fibrillation: Secondary | ICD-10-CM | POA: Insufficient documentation

## 2013-01-30 DIAGNOSIS — I4892 Unspecified atrial flutter: Secondary | ICD-10-CM

## 2013-01-30 DIAGNOSIS — Z87891 Personal history of nicotine dependence: Secondary | ICD-10-CM | POA: Insufficient documentation

## 2013-01-30 DIAGNOSIS — Z79899 Other long term (current) drug therapy: Secondary | ICD-10-CM | POA: Insufficient documentation

## 2013-01-30 DIAGNOSIS — Z794 Long term (current) use of insulin: Secondary | ICD-10-CM | POA: Insufficient documentation

## 2013-01-30 DIAGNOSIS — F0390 Unspecified dementia without behavioral disturbance: Secondary | ICD-10-CM | POA: Insufficient documentation

## 2013-01-30 MED ORDER — METOPROLOL SUCCINATE ER 25 MG PO TB24: 12.5000 mg | ORAL_TABLET | Freq: Every day | ORAL | Status: DC

## 2013-01-30 NOTE — ED Provider Notes (Signed)
CSN: [id number]     Arrival date & time 01/30/13  0425 History   First MD Initiated Contact with Patient 01/30/13 [phone]     Chief Complaint  Patient presents with  . Palpitations   (Consider location/radiation/quality/duration/timing/severity/associated sxs/prior Treatment) HPI 79 yo male presents to the ER from home with complaint of palpitations.  Pt reports tonight just prior to arrival he had onset of fast heartrate intermittently over 30-60 min.  It has since resolved.  Sxs started after coming back from the bathroom.  Pt was recently dx with aflutter, has follow up appointment with cardiology on 10/28.  No chest pain, no sob.  Pt has past history of htn, elevated cholesterol, dm, and mild dementia.    Past Medical History  Diagnosis Date  . Prostate cancer 07/18/11    bx=adenocarcinoma=PSA=12.45,gleason=4+5=9,4+3=7,4+4=8, 3+3=6 & 3+7,volume=156.35cc  . Diabetes mellitus   . Hypertension   . Hypercholesterolemia   . BPH (benign prostatic hyperplasia)     with nocturia,  . ED (erectile dysfunction)   . Hx of radiation therapy 10/23/11 - 12/18/11    prostate, seminal vesicles, lymph nodes  . Mild dementia    Past Surgical History  Procedure Laterality Date  . Prostate biopsy  07/18/2011    gleason 4+5=9  . Rotator cuff repair  2011  . Fracture surgery  35 years    right leg/rod placement   Family History  Problem Relation Age of Onset  . Pneumonia Mother   . Cancer Father    History  Substance Use Topics  . Smoking status: Former Smoker -- 1.00 packs/day for 10 years    Types: Cigarettes    Quit date: 09/04/1972  . Smokeless tobacco: Never Used     Comment: quit 20 years ago  . Alcohol Use: 0.0 oz/week     Comment: occasionally    Review of Systems  All other systems reviewed and are negative.    Allergies  Review of patient's allergies indicates no known allergies.  Home Medications   Current Outpatient Rx  Name  Route  Sig  Dispense  Refill  . amLODipine  (NORVASC) 5 MG tablet   Oral   Take 5 mg by mouth daily.         Marland Kitchen donepezil (ARICEPT) 10 MG tablet   Oral   Take 10 mg by mouth at bedtime.         . ENSURE (ENSURE)   Oral   Take 237 mLs by mouth as needed.         . insulin aspart (NOVOLOG) 100 UNIT/ML injection   Subcutaneous   Inject 0-20 Units into the skin every 4 (four) hours. Sliding scale insulin. CBGs are: 121-150 give 3 units 151-200 give 4 units 201 -250 give 7 units 251-300 give 10 units 301 -350 give 15 units 351-400 give 20 units   1 vial   0   . multivitamin-lutein (OCUVITE-LUTEIN) CAPS   Oral   Take 1 capsule by mouth daily.         . pravastatin (PRAVACHOL) 80 MG tablet   Oral   Take 80 mg by mouth daily.         Marland Kitchen PRESCRIPTION MEDICATION   Oral   Take 1 tablet by mouth daily. Doctor gave patient samples of a medication that helps with urine output         . sitaGLIPtin (JANUVIA) 100 MG tablet   Oral   Take 100 mg by mouth daily.         Marland Kitchen  Travoprost, BAK Free, (TRAVATAN) 0.004 % SOLN ophthalmic solution   Both Eyes   Place 1 drop into both eyes at bedtime.          BP 154/77  Pulse 73  Temp(Src) 98.6 F (37 C) (Oral)  Resp 17  SpO2 100% Physical Exam  Nursing note and vitals reviewed. Constitutional: He is oriented to person, place, and time. He appears well-developed and well-nourished. No distress.  HENT:  Head: Normocephalic and atraumatic.  Nose: Nose normal.  Mouth/Throat: Oropharynx is clear and moist.  Eyes: Conjunctivae and EOM are normal. Pupils are equal, round, and reactive to light.  Neck: Normal range of motion. Neck supple. No JVD present. No tracheal deviation present. No thyromegaly present.  Cardiovascular: Normal rate, regular rhythm, normal heart sounds and intact distal pulses.  Exam reveals no gallop and no friction rub.   No murmur heard. Pulmonary/Chest: Effort normal and breath sounds normal. No stridor. No respiratory distress. He has no  wheezes. He has no rales. He exhibits no tenderness.  Abdominal: Soft. Bowel sounds are normal. He exhibits no distension and no mass. There is no tenderness. There is no rebound and no guarding.  Musculoskeletal: Normal range of motion. He exhibits no edema and no tenderness.  Lymphadenopathy:    He has no cervical adenopathy.  Neurological: He is alert and oriented to person, place, and time. He exhibits normal muscle tone. Coordination normal.  Skin: Skin is warm and dry. No rash noted. No erythema. No pallor.  Psychiatric: He has a normal mood and affect. His behavior is normal. Judgment and thought content normal.    ED Course  Procedures (including critical care time) Labs Review Labs Reviewed - No data to display Imaging Review No results found.  EKG Interpretation     Ventricular Rate:  72 PR Interval:    QRS Duration: 76 QT Interval:  414 QTC Calculation: 453 R Axis:   22 Text Interpretation:  Atrial flutter with 4:1 A-V conduction Nonspecific ST abnormality Abnormal ECG            MDM   1. Atrial flutter with controlled response   2. Palpitations    79 yo male with palpations earlier, now better.  EKG with aflutter, 4:1 block rate in 70s.  Will d/w cardiology starting low dose bblocker for symptoms, otherwise feel he is stable for close f/u in office.  5:43 AM D/w Dr Jon Billings with cardiology.  He agrees with metoprolol 12.5 mg to help with tachycardias.      Olivia Mackie, MD 01/30/13 [phone]

## 2013-01-30 NOTE — ED Notes (Signed)
Pt. Reports woke up with palpitations. States "It beats really fast and then goes away and then it'll beat fast again". Pt. Reports palpitations last "a couple seconds".

## 2013-01-30 NOTE — ED Notes (Signed)
Pt. Alert and oriented x4 on discharge. Denies dizziness/chest pain/palpitations/SOB at this time.

## 2013-01-30 NOTE — ED Notes (Signed)
Pt. Denies chest pain, SOB, N/V with palpitations. Denies palpitations at this time.

## 2013-02-03 ENCOUNTER — Other Ambulatory Visit (HOSPITAL_COMMUNITY): Payer: Medicare Other

## 2013-02-05 ENCOUNTER — Inpatient Hospital Stay (HOSPITAL_COMMUNITY): Admission: RE | Admit: 2013-02-05 | Payer: Medicare Other | Source: Ambulatory Visit

## 2013-02-10 ENCOUNTER — Inpatient Hospital Stay: Admit: 2013-02-10 | Payer: Medicare Other | Admitting: Orthopedic Surgery

## 2013-02-10 SURGERY — ARTHROPLASTY, KNEE, TOTAL
Anesthesia: General | Site: Knee | Laterality: Right

## 2013-05-11 ENCOUNTER — Encounter (HOSPITAL_COMMUNITY): Payer: Self-pay

## 2013-05-11 ENCOUNTER — Ambulatory Visit (HOSPITAL_COMMUNITY)
Admission: RE | Admit: 2013-05-11 | Discharge: 2013-05-11 | Disposition: A | Discharge status: Home / Self Care | Payer: Medicare Other | Source: Ambulatory Visit | Attending: Cardiology | Admitting: Cardiology

## 2013-05-11 ENCOUNTER — Ambulatory Visit (HOSPITAL_COMMUNITY): Payer: Medicare Other | Admitting: Anesthesiology

## 2013-05-11 ENCOUNTER — Encounter (HOSPITAL_COMMUNITY): Payer: Medicare Other | Admitting: Anesthesiology

## 2013-05-11 ENCOUNTER — Encounter (HOSPITAL_COMMUNITY)
Admission: RE | Disposition: A | Discharge status: Home / Self Care | Payer: Medicare Other | Source: Ambulatory Visit | Attending: Cardiology

## 2013-05-11 DIAGNOSIS — I4891 Unspecified atrial fibrillation: Secondary | ICD-10-CM | POA: Insufficient documentation

## 2013-05-11 DIAGNOSIS — I4892 Unspecified atrial flutter: Secondary | ICD-10-CM

## 2013-05-11 HISTORY — PX: CARDIOVERSION: SHX1299

## 2013-05-11 LAB — GLUCOSE, CAPILLARY: Glucose-Capillary: 172 mg/dL — ABNORMAL HIGH (ref 70–99)

## 2013-05-11 SURGERY — CARDIOVERSION
Anesthesia: General

## 2013-05-11 MED ORDER — HYDRALAZINE HCL 20 MG/ML IJ SOLN
INTRAMUSCULAR | Status: AC
  Filled 2013-05-11: qty 1

## 2013-05-11 MED ORDER — HYDRALAZINE HCL 20 MG/ML IJ SOLN: 10.0000 mg | Freq: Once | INTRAMUSCULAR | Status: DC

## 2013-05-11 MED ORDER — LIDOCAINE HCL (CARDIAC) 20 MG/ML IV SOLN
INTRAVENOUS | Status: DC | PRN
  Administered 2013-05-11: 20 mg via INTRAVENOUS

## 2013-05-11 MED ORDER — AMLODIPINE BESYLATE 10 MG PO TABS
10.0000 mg | ORAL_TABLET | ORAL | Status: AC
  Administered 2013-05-11: 10 mg via ORAL
  Filled 2013-05-11: qty 1

## 2013-05-11 MED ORDER — SODIUM CHLORIDE 0.9 % IV SOLN
INTRAVENOUS | Status: DC
Start: 2013-05-11 — End: 2013-05-11
  Administered 2013-05-11: 11:00:00 via INTRAVENOUS

## 2013-05-11 MED ORDER — PROPOFOL 10 MG/ML IV BOLUS
INTRAVENOUS | Status: DC | PRN
  Administered 2013-05-11: 50 mg via INTRAVENOUS

## 2013-05-11 NOTE — Progress Notes (Signed)
Pt BP 189/104 in Recovery, pt asymptomatic. Dr. Einar Gip notified. Order for Amlodipine 10 mg to be given prior to discharge. Lexine Baton

## 2013-05-11 NOTE — Transfer of Care (Signed)
Immediate Anesthesia Transfer of Care Note  Patient: Craig Henderson  Procedure(s) Performed: Procedure(s): CARDIOVERSION (N/A)  Patient Location: PACU  Anesthesia Type:General  Level of Consciousness: awake, sedated and responds to stimulation  Airway & Oxygen Therapy: Patient Spontanous Breathing and Patient connected to nasal cannula oxygen  Post-op Assessment: Report given to PACU RN and Post -op Vital signs reviewed and stable  Post vital signs: Reviewed and stable  Complications: No apparent anesthesia complications

## 2013-05-11 NOTE — Discharge Instructions (Signed)
Electrical Cardioversion, Care After °Refer to this sheet in the next few weeks. These instructions provide you with information on caring for yourself after your procedure. Your health care provider may also give you more specific instructions. Your treatment has been planned according to current medical practices, but problems sometimes occur. Call your health care provider if you have any problems or questions after your procedure. °WHAT TO EXPECT AFTER THE PROCEDURE °After your procedure, it is typical to have the following sensations: °· Some redness on the skin where the shocks were delivered. If this is tender, a sunburn lotion or hydrocortisone cream may help. °· Possible return of an abnormal heart rhythm within hours or days after the procedure. °HOME CARE INSTRUCTIONS °· Only take medicine as directed by your health care provider. Be sure you understand how and when to take your medicine. °· Learn how to feel your pulse and check it often. °· Limit your activity for 48 hours after the procedure or as directed. °· Avoid or minimize caffeine and other stimulants as directed. °SEEK MEDICAL CARE IF: °· You feel like your heart is beating too fast or your pulse is not regular. °· You have any questions about your medicines. °· You have bleeding that will not stop. °SEEK IMMEDIATE MEDICAL CARE IF: °· You are dizzy or feel faint. °· It is hard to breathe or you feel short of breath. °· There is a change in discomfort in your chest. °· Your speech is slurred or you have trouble moving an arm or leg on one side of your body. °· You get a serious muscle cramp that does not go away. °· Your fingers or toes turn cold or blue. °MAKE SURE YOU:  °· Understand these instructions.   °· Will watch your condition.   °· Will get help right away if you are not doing well or get worse. °Document Released: 01/14/2013 Document Reviewed: 10/08/2012 °ExitCare® Patient Information ©2014 [employer]. ° °Monitored Anesthesia Care   °Monitored anesthesia care is an anesthesia service for a medical procedure. Anesthesia is the loss of the ability to feel pain. It is produced by medications called anesthetics. It may affect a small area of your body (local anesthesia), a large area of your body (regional anesthesia), or your entire body (general anesthesia). The need for monitored anesthesia care depends your procedure, your condition, and the potential need for regional or general anesthesia. It is often provided during procedures where:  °· General anesthesia may be needed if there are complications. This is because you need special care when you are under general anesthesia.   °· You will be under local or regional anesthesia. This is so that you are able to have higher levels of anesthesia if needed.   °· You will receive calming medications (sedatives). This is especially the case if sedatives are given to put you in a semi-conscious state of relaxation (deep sedation). This is because the amount of sedative needed to produce this state can be hard to predict. Too much of a sedative can produce general anesthesia. °Monitored anesthesia care is performed by one or more caregivers who have special training in all types of anesthesia. You will need to meet with these caregivers before your procedure. During this meeting, they will ask you about your medical history. They will also give you instructions to follow. (For example, you will need to stop eating and drinking before your procedure. You may also need to stop or change medications you are taking.) During your procedure, your   caregivers will stay with you. They will:  °· Watch your condition. This includes watching you blood pressure, breathing, and level of pain.   °· Diagnose and treat problems that occur.   °· Give medications if they are needed. These may include calming medications (sedatives) and anesthetics.   °· Make sure you are comfortable.   °Having monitored anesthesia care  does not necessarily mean that you will be under anesthesia. It does mean that your caregivers will be able to manage anesthesia if you need it or if it occurs. It also means that you will be able to have a different type of anesthesia than you are having if you need it. When your procedure is complete, your caregivers will continue to watch your condition. They will make sure any medications wear off before you are allowed to go home.  °Document Released: 12/20/2004 Document Revised: 07/21/2012 Document Reviewed: 05/07/2012 °ExitCare® Patient Information ©2014 [employer]. ° ° °

## 2013-05-11 NOTE — Preoperative (Signed)
Beta Blockers   Reason not to administer Beta Blockers:Metoprolol this morning

## 2013-05-11 NOTE — H&P (Signed)
  Please see office visit notes for complete details of HPI.  

## 2013-05-11 NOTE — CV Procedure (Signed)
Direct current cardioversion:  Indication symptomatic A. Flutter  Procedure: Using 50 mg of IV Propofol and 20 IV Lidocaine (for reducing venous pain) for achieving deep sedation, synchronized direct current cardioversion performed. Patient was delivered with 50 Joules of electricity X 1 with success of converting A. Flutter to NSR. Patient tolerated the procedure well. No immediate complication noted.

## 2013-05-11 NOTE — Anesthesia Preprocedure Evaluation (Addendum)
Anesthesia Evaluation  Patient identified by MRN, date of birth, ID band Patient awake    Reviewed: Allergy & Precautions, H&P , NPO status , Patient's Chart, lab work & pertinent test results, reviewed documented beta blocker date and time   History of Anesthesia Complications Negative for: history of anesthetic complications  Airway Mallampati: II TM Distance: >3 FB Neck ROM: Full    Dental  (+) Chipped and Dental Advisory Given   Pulmonary former smoker (quit '74),  breath sounds clear to auscultation        Cardiovascular hypertension, Pt. on home beta blockers + dysrhythmias Atrial Fibrillation Rhythm:Irregular Rate:Normal  Normal LVF, as per Dr. Einar Gip   Neuro/Psych H/o encephalopathy    GI/Hepatic negative GI ROS, Neg liver ROS,   Endo/Other  diabetes (glu 172), Insulin Dependent and Oral Hypoglycemic Agents  Renal/GU Renal InsufficiencyRenal disease     Musculoskeletal   Abdominal   Peds  Hematology   Anesthesia Other Findings H/o prostate cancer  Reproductive/Obstetrics                       Anesthesia Physical Anesthesia Plan  ASA: III  Anesthesia Plan: General   Post-op Pain Management:    Induction: Intravenous  Airway Management Planned: Mask  Additional Equipment:   Intra-op Plan:   Post-operative Plan:   Informed Consent: I have reviewed the patients History and Physical, chart, labs and discussed the procedure including the risks, benefits and alternatives for the proposed anesthesia with the patient or authorized representative who has indicated his/her understanding and acceptance.     Plan Discussed with: CRNA and Surgeon  Anesthesia Plan Comments: (Plan routine monitors, GA for cardioversion)       Anesthesia Quick Evaluation

## 2013-05-11 NOTE — Interval H&P Note (Signed)
History and Physical Interval Note:  05/11/2013 11:56 AM  Craig Henderson  has presented today for surgery, with the diagnosis of afib  The various methods of treatment have been discussed with the patient and family. After consideration of risks, benefits and other options for treatment, the patient has consented to  Procedure(s): CARDIOVERSION (N/A) as a surgical intervention .  The patient's history has been reviewed, patient examined, no change in status, stable for surgery.  I have reviewed the patient's chart and labs.  Questions were answered to the patient's satisfaction.     Laverda Page

## 2013-05-11 NOTE — Anesthesia Postprocedure Evaluation (Signed)
  Anesthesia Post-op Note  Patient: Craig Henderson  Procedure(s) Performed: Procedure(s): CARDIOVERSION (N/A)  Patient Location: PACU  Anesthesia Type:General  Level of Consciousness: awake, sedated and responds to stimulation  Airway and Oxygen Therapy: Patient Spontanous Breathing and Patient connected to nasal cannula oxygen  Post-op Pain: none  Post-op Assessment: Post-op Vital signs reviewed, Patient's Cardiovascular Status Stable, Respiratory Function Stable, Patent Airway and No signs of Nausea or vomiting  Post-op Vital Signs: Reviewed and stable  Complications: No apparent anesthesia complications

## 2013-05-12 ENCOUNTER — Encounter (HOSPITAL_COMMUNITY): Payer: Self-pay | Admitting: Cardiology

## 2013-05-13 ENCOUNTER — Ambulatory Visit (INDEPENDENT_AMBULATORY_CARE_PROVIDER_SITE_OTHER): Payer: Medicare Other | Admitting: Diagnostic Neuroimaging

## 2013-05-13 ENCOUNTER — Encounter: Payer: Self-pay | Admitting: Diagnostic Neuroimaging

## 2013-05-13 VITALS — BP 157/82 | HR 74 | Temp 98.2°F | Ht 66.5 in | Wt 168.0 lb

## 2013-05-13 DIAGNOSIS — F0390 Unspecified dementia without behavioral disturbance: Secondary | ICD-10-CM

## 2013-05-13 DIAGNOSIS — R269 Unspecified abnormalities of gait and mobility: Secondary | ICD-10-CM | POA: Insufficient documentation

## 2013-05-13 DIAGNOSIS — I4892 Unspecified atrial flutter: Secondary | ICD-10-CM

## 2013-05-13 DIAGNOSIS — F03A0 Unspecified dementia, mild, without behavioral disturbance, psychotic disturbance, mood disturbance, and anxiety: Secondary | ICD-10-CM

## 2013-05-13 MED ORDER — MEMANTINE HCL ER 28 MG PO CP24: 1.0000 | ORAL_CAPSULE | Freq: Every day | ORAL | Status: DC

## 2013-05-13 MED ORDER — DONEPEZIL HCL 10 MG PO TABS: 10.0000 mg | ORAL_TABLET | Freq: Every day | ORAL | Status: DC

## 2013-05-13 NOTE — Patient Instructions (Signed)
Continue donepezil 10mg  daily.  Change namenda to XR 28mg  daily.  Use walker. Caution with balance and walking and xarelto usage.  Discuss end of life planning with family and your PCP.
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
## 2013-10-02 ENCOUNTER — Encounter (HOSPITAL_COMMUNITY): Payer: Self-pay | Admitting: Emergency Medicine

## 2013-10-02 ENCOUNTER — Emergency Department (HOSPITAL_COMMUNITY)
Admission: EM | Admit: 2013-10-02 | Discharge: 2013-10-02 | Disposition: A | Discharge status: Home / Self Care | Payer: Medicare Other | Attending: Emergency Medicine | Admitting: Emergency Medicine

## 2013-10-02 ENCOUNTER — Emergency Department (HOSPITAL_COMMUNITY): Payer: Medicare Other

## 2013-10-02 DIAGNOSIS — Z87891 Personal history of nicotine dependence: Secondary | ICD-10-CM | POA: Insufficient documentation

## 2013-10-02 DIAGNOSIS — E7800 Pure hypercholesterolemia, unspecified: Secondary | ICD-10-CM | POA: Insufficient documentation

## 2013-10-02 DIAGNOSIS — D649 Anemia, unspecified: Secondary | ICD-10-CM | POA: Insufficient documentation

## 2013-10-02 DIAGNOSIS — Z79899 Other long term (current) drug therapy: Secondary | ICD-10-CM | POA: Insufficient documentation

## 2013-10-02 DIAGNOSIS — N390 Urinary tract infection, site not specified: Secondary | ICD-10-CM

## 2013-10-02 DIAGNOSIS — Z8546 Personal history of malignant neoplasm of prostate: Secondary | ICD-10-CM | POA: Insufficient documentation

## 2013-10-02 DIAGNOSIS — I10 Essential (primary) hypertension: Secondary | ICD-10-CM | POA: Insufficient documentation

## 2013-10-02 DIAGNOSIS — R319 Hematuria, unspecified: Secondary | ICD-10-CM | POA: Insufficient documentation

## 2013-10-02 DIAGNOSIS — E119 Type 2 diabetes mellitus without complications: Secondary | ICD-10-CM | POA: Insufficient documentation

## 2013-10-02 DIAGNOSIS — F0390 Unspecified dementia without behavioral disturbance: Secondary | ICD-10-CM | POA: Insufficient documentation

## 2013-10-02 DIAGNOSIS — Z7901 Long term (current) use of anticoagulants: Secondary | ICD-10-CM | POA: Insufficient documentation

## 2013-10-02 LAB — POC OCCULT BLOOD, ED: Fecal Occult Bld: NEGATIVE

## 2013-10-02 LAB — CBC WITH DIFFERENTIAL/PLATELET
BASOS PCT: 0 % (ref 0–1)
Basophils Absolute: 0 10*3/uL (ref 0.0–0.1)
EOS ABS: 0.1 10*3/uL (ref 0.0–0.7)
EOS PCT: 2 % (ref 0–5)
HEMATOCRIT: 33.7 % — AB (ref 39.0–52.0)
Hemoglobin: 11.4 g/dL — ABNORMAL LOW (ref 13.0–17.0)
Lymphocytes Relative: 11 % — ABNORMAL LOW (ref 12–46)
Lymphs Abs: 1 10*3/uL (ref 0.7–4.0)
MCH: 31.9 pg (ref 26.0–34.0)
MCHC: 33.8 g/dL (ref 30.0–36.0)
MCV: 94.4 fL (ref 78.0–100.0)
MONO ABS: 0.5 10*3/uL (ref 0.1–1.0)
Monocytes Relative: 6 % (ref 3–12)
Neutro Abs: 7.4 10*3/uL (ref 1.7–7.7)
Neutrophils Relative %: 81 % — ABNORMAL HIGH (ref 43–77)
Platelets: 235 10*3/uL (ref 150–400)
RBC: 3.57 MIL/uL — ABNORMAL LOW (ref 4.22–5.81)
RDW: 12.6 % (ref 11.5–15.5)
WBC: 9 10*3/uL (ref 4.0–10.5)

## 2013-10-02 LAB — BASIC METABOLIC PANEL
BUN: 16 mg/dL (ref 6–23)
CALCIUM: 9.7 mg/dL (ref 8.4–10.5)
CO2: 23 mEq/L (ref 19–32)
CREATININE: 1.17 mg/dL (ref 0.50–1.35)
Chloride: 99 mEq/L (ref 96–112)
GFR calc Af Amer: 66 mL/min — ABNORMAL LOW (ref >90)
GFR, EST NON AFRICAN AMERICAN: 57 mL/min — AB (ref >90)
GLUCOSE: 164 mg/dL — AB (ref 70–99)
Potassium: 4.2 mEq/L (ref 3.7–5.3)
Sodium: 139 mEq/L (ref 137–147)

## 2013-10-02 LAB — URINE MICROSCOPIC-ADD ON

## 2013-10-02 LAB — URINALYSIS, ROUTINE W REFLEX MICROSCOPIC
Bilirubin Urine: NEGATIVE
Glucose, UA: NEGATIVE mg/dL
KETONES UR: NEGATIVE mg/dL
Nitrite: NEGATIVE
Protein, ur: 100 mg/dL — AB
Specific Gravity, Urine: 1.016 (ref 1.005–1.030)
UROBILINOGEN UA: 0.2 mg/dL (ref 0.0–1.0)
pH: 5.5 (ref 5.0–8.0)

## 2013-10-02 LAB — PROTIME-INR
INR: 1.51 — ABNORMAL HIGH (ref 0.00–1.49)
Prothrombin Time: 18.2 seconds — ABNORMAL HIGH (ref 11.6–15.2)

## 2013-10-02 MED ORDER — IOHEXOL 300 MG/ML  SOLN
100.0000 mL | Freq: Once | INTRAMUSCULAR | Status: AC | PRN
  Administered 2013-10-02: 100 mL via INTRAVENOUS

## 2013-10-02 MED ORDER — DEXTROSE 5 % IV SOLN
1.0000 g | INTRAVENOUS | Status: DC
  Administered 2013-10-02: 1 g via INTRAVENOUS
  Filled 2013-10-02: qty 10

## 2013-10-02 MED ORDER — IOHEXOL 300 MG/ML  SOLN: 25.0000 mL | INTRAMUSCULAR | Status: AC

## 2013-10-02 MED ORDER — SODIUM CHLORIDE 0.9 % IV BOLUS (SEPSIS)
500.0000 mL | Freq: Once | INTRAVENOUS | Status: AC
  Administered 2013-10-02: 500 mL via INTRAVENOUS

## 2013-10-02 MED ORDER — CEPHALEXIN 500 MG PO CAPS: 500.0000 mg | ORAL_CAPSULE | Freq: Four times a day (QID) | ORAL | Status: DC

## 2013-10-02 NOTE — ED Provider Notes (Signed)
Pt received in sign out from PA Forucci at shift change.  80 y.o. M presenting to the ED for hematuria.  Pt is demented, wife reported hematuria and states he reported to her some flank pain.  No associated dysuria, urinary frequency, urgency.  No hx of kidney stones.  Labs obtained which are reassuring-- H/H stable.  U/a with large blood, large leuks. Urine culture pending. Pt is on xarelto.  Plan:  Concern for malignancy.   CT abdomen/pelvis with contrast pending for further eval.  Will follow results and dispo accordingly.  Results for orders placed during the hospital encounter of 10/02/13  URINALYSIS, ROUTINE W REFLEX MICROSCOPIC      Result Value Ref Range   Color, Urine RED (*) YELLOW   APPearance TURBID (*) CLEAR   Specific Gravity, Urine 1.016  1.005 - 1.030   pH 5.5  5.0 - 8.0   Glucose, UA NEGATIVE  NEGATIVE mg/dL   Hgb urine dipstick LARGE (*) NEGATIVE   Bilirubin Urine NEGATIVE  NEGATIVE   Ketones, ur NEGATIVE  NEGATIVE mg/dL   Protein, ur 100 (*) NEGATIVE mg/dL   Urobilinogen, UA 0.2  0.0 - 1.0 mg/dL   Nitrite NEGATIVE  NEGATIVE   Leukocytes, UA LARGE (*) NEGATIVE  CBC WITH DIFFERENTIAL      Result Value Ref Range   WBC 9.0  4.0 - 10.5 K/uL   RBC 3.57 (*) 4.22 - 5.81 MIL/uL   Hemoglobin 11.4 (*) 13.0 - 17.0 g/dL   HCT 33.7 (*) 39.0 - 52.0 %   MCV 94.4  78.0 - 100.0 fL   MCH 31.9  26.0 - 34.0 pg   MCHC 33.8  30.0 - 36.0 g/dL   RDW 12.6  11.5 - 15.5 %   Platelets 235  150 - 400 K/uL   Neutrophils Relative % 81 (*) 43 - 77 %   Neutro Abs 7.4  1.7 - 7.7 K/uL   Lymphocytes Relative 11 (*) 12 - 46 %   Lymphs Abs 1.0  0.7 - 4.0 K/uL   Monocytes Relative 6  3 - 12 %   Monocytes Absolute 0.5  0.1 - 1.0 K/uL   Eosinophils Relative 2  0 - 5 %   Eosinophils Absolute 0.1  0.0 - 0.7 K/uL   Basophils Relative 0  0 - 1 %   Basophils Absolute 0.0  0.0 - 0.1 K/uL  PROTIME-INR      Result Value Ref Range   Prothrombin Time 18.2 (*) 11.6 - 15.2 seconds   INR 1.51 (*) 0.00 - 8.24   BASIC METABOLIC PANEL      Result Value Ref Range   Sodium 139  137 - 147 mEq/L   Potassium 4.2  3.7 - 5.3 mEq/L   Chloride 99  96 - 112 mEq/L   CO2 23  19 - 32 mEq/L   Glucose, Bld 164 (*) 70 - 99 mg/dL   BUN 16  6 - 23 mg/dL   Creatinine, Ser 1.17  0.50 - 1.35 mg/dL   Calcium 9.7  8.4 - 10.5 mg/dL   GFR calc non Af Amer 57 (*) >90 mL/min   GFR calc Af Amer 66 (*) >90 mL/min  URINE MICROSCOPIC-ADD ON      Result Value Ref Range   Squamous Epithelial / LPF RARE  RARE   WBC, UA TOO NUMEROUS TO COUNT  <3 WBC/hpf   RBC / HPF TOO NUMEROUS TO COUNT  <3 RBC/hpf   Bacteria, UA FEW (*) RARE  POC OCCULT BLOOD, ED      Result Value Ref Range   Fecal Occult Bld NEGATIVE  NEGATIVE   Ct Abdomen Pelvis W Contrast  10/02/2013   CLINICAL DATA:  Hematuria.  History of prostate cancer.  EXAM: CT ABDOMEN AND PELVIS WITH CONTRAST  TECHNIQUE: Multidetector CT imaging of the abdomen and pelvis was performed using the standard protocol following bolus administration of intravenous contrast.  CONTRAST:  132mL OMNIPAQUE IOHEXOL 300 MG/ML  SOLN  COMPARISON:  Retroperitoneal sonogram February 06, 2012 and CT of the pelvis August 07, 2011  FINDINGS: LUNG BASES: Included view of the lung bases demonstrates left lower lobe atelectasis versus scarring. Visualized heart and pericardium are unremarkable.  SOLID ORGANS: The liver, spleen, gallbladder, pancreas are unremarkable. Sub cm adrenal gland thickening may reflect hyperplasia.  GASTROINTESTINAL TRACT: The stomach, small and large bowel are normal in course and caliber without inflammatory changes. Scattered mild colonic diverticulosis. Normal appendix.  KIDNEYS/ URINARY TRACT: Kidneys are orthotopic, demonstrating symmetric enhancement. No nephrolithiasis, hydronephrosis or renal masses. The unopacified ureters are normal in course and caliber. Delayed imaging through the kidneys demonstrates symmetric prompt excretion to the proximal urinary collecting system.  Partially distended with circumferential urinary bladder wall thickening which is worse, with prostatomegaly, prostate is 6.2 x 5 cm, invading the base of the urinary bladder. Radiopaque foreign bodies about the prostate could reflect clips.  PERITONEUM/RETROPERITONEUM: No intraperitoneal free fluid nor free air. Aortoiliac vessels are normal in course and caliber, moderate to severe calcific atherosclerosis. No lymphadenopathy by CT size criteria ; and 8 mm left external iliac chain lymph node is now 4 mm.  SOFT TISSUE/OSSEOUS STRUCTURES: Nonsuspicious. Left femoral head neck remote pin tract. Bridging sacroiliac osteophytes. Lumbar spondylosis without least mild to moderate canal stenosis at L4-5 with severe L4-5 and L5-S1 neural foraminal narrowing.  IMPRESSION: Prostatomegaly, invading the base of the urinary bladder with circumferential urinary bladder wall thickening suggest chronic outlet obstruction and possibly superimposed cystitis, recommend correlation with urinary analysis. No hydronephrosis.   Electronically Signed   By: Elon Alas   On: 10/02/2013 18:10   6:25 PM CT abdomen and pelvis revealing enlarged prostate with questionable superimposed cystitis. UA does have large blood and large leuks-- patient given dose of Rocephin in the ED and will discharge home with Keflex. He is well established with Dr. Janice Norrie of [facility] and will FU with him.  Discussed plan with patient, he/she acknowledged understanding and agreed with plan of care.  Return precautions given for new or worsening symptoms.   Larene Pickett, PA-C 10/02/13 2217

## 2013-10-02 NOTE — ED Notes (Signed)
Pt reports having blood in urine since yesterday and occ pain with urination.

## 2013-10-02 NOTE — Discharge Instructions (Signed)
Hematuria, Adult Hematuria is blood in your urine. It can be caused by a bladder infection, kidney infection, prostate infection, kidney stone, or cancer of your urinary tract. Infections can usually be treated with medicine, and a kidney stone usually will pass through your urine. If neither of these is the cause of your hematuria, further workup to find out the reason may be needed. It is very important that you tell your health care provider about any blood you see in your urine, even if the blood stops without treatment or happens without causing pain. Blood in your urine that happens and then stops and then happens again can be a symptom of a very serious condition. Also, pain is not a symptom in the initial stages of many urinary cancers. HOME CARE INSTRUCTIONS   Drink lots of fluid, 3-4 quarts a day. If you have been diagnosed with an infection, cranberry juice is especially recommended, in addition to large amounts of water.  Avoid caffeine, tea, and carbonated beverages, because they tend to irritate the bladder.  Avoid alcohol because it may irritate the prostate.  Only take over-the-counter or prescription medicines for pain, discomfort, or fever as directed by your health care provider.  If you have been diagnosed with a kidney stone, follow your health care provider's instructions regarding straining your urine to catch the stone.  Empty your bladder often. Avoid holding urine for long periods of time.  After a bowel movement, women should cleanse front to back. Use each tissue only once.  Empty your bladder before and after sexual intercourse if you are a male. SEEK MEDICAL CARE IF: You develop back pain, fever, a feeling of sickness in your stomach (nausea), or vomiting or if your symptoms are not better in 3 days. Return sooner if you are getting worse. SEEK IMMEDIATE MEDICAL CARE IF:   You have a persistent fever, with a temperature of 101.43F (38.8C) or greater.  You  develop severe vomiting and are unable to keep the medicine down.  You develop severe back or abdominal pain despite taking your medicines.  You begin passing a large amount of blood or clots in your urine.  You feel extremely weak or faint, or you pass out. MAKE SURE YOU:   Understand these instructions.  Will watch your condition.  Will get help right away if you are not doing well or get worse. Document Released: 03/26/2005 Document Revised: 01/14/2013 Document Reviewed: 11/24/2012 [facility] Patient Information 2015 [place], [place]. This information is not intended to replace advice given to you by your health care provider. Make sure you discuss any questions you have with your health care provider.  Urinary Tract Infection Urinary tract infections (UTIs) can develop anywhere along your urinary tract. Your urinary tract is your body's drainage system for removing wastes and extra water. Your urinary tract includes two kidneys, two ureters, a bladder, and a urethra. Your kidneys are a pair of bean-shaped organs. Each kidney is about the size of your fist. They are located below your ribs, one on each side of your spine. CAUSES Infections are caused by microbes, which are microscopic organisms, including fungi, viruses, and bacteria. These organisms are so small that they can only be seen through a microscope. Bacteria are the microbes that most commonly cause UTIs. SYMPTOMS  Symptoms of UTIs may vary by age and gender of the patient and by the location of the infection. Symptoms in young women typically include a frequent and intense urge to urinate and a painful,  painful, burning feeling in the bladder or urethra during urination. Older women and men are more likely to be tired, shaky, and weak and have muscle aches and abdominal pain. A fever may mean the infection is in your kidneys. Other symptoms of a kidney infection include pain in your back or sides below the ribs, nausea, and  vomiting. °DIAGNOSIS °To diagnose a UTI, your caregiver will ask you about your symptoms. Your caregiver also will ask to provide a urine sample. The urine sample will be tested for bacteria and white blood cells. White blood cells are made by your body to help fight infection. °TREATMENT  °Typically, UTIs can be treated with medication. Because most UTIs are caused by a bacterial infection, they usually can be treated with the use of antibiotics. The choice of antibiotic and length of treatment depend on your symptoms and the type of bacteria causing your infection. °HOME CARE INSTRUCTIONS °· If you were prescribed antibiotics, take them exactly as your caregiver instructs you. Finish the medication even if you feel better after you have only taken some of the medication. °· Drink enough water and fluids to keep your urine clear or pale yellow. °· Avoid caffeine, tea, and carbonated beverages. They tend to irritate your bladder. °· Empty your bladder often. Avoid holding urine for long periods of time. °· Empty your bladder before and after sexual intercourse. °· After a bowel movement, women should cleanse from front to back. Use each tissue only once. °SEEK MEDICAL CARE IF:  °· You have back pain. °· You develop a fever. °· Your symptoms do not begin to resolve within 3 days. °SEEK IMMEDIATE MEDICAL CARE IF:  °· You have severe back pain or lower abdominal pain. °· You develop chills. °· You have nausea or vomiting. °· You have continued burning or discomfort with urination. °MAKE SURE YOU:  °· Understand these instructions. °· Will watch your condition. °· Will get help right away if you are not doing well or get worse. °Document Released: 01/03/2005 Document Revised: 09/25/2011 Document Reviewed: 05/04/2011 °[employer]® Patient Information ©2015 [employer]. This information is not intended to replace advice given to you by your health care provider. Make sure you discuss any questions you have with your health  care provider. ° °

## 2013-10-02 NOTE — ED Notes (Signed)
Pt urinated in the urinal. His urine was bloody. He is incontinent with bowel. Bowel had very bad odor and was very dark green or near black color. His scrotum also was bleeding and when his pullup was taken off, there was blood clots and blot in the pullup.

## 2013-10-02 NOTE — ED Notes (Signed)
Pt unable to urinate and obtain urine sample at triage.

## 2013-10-02 NOTE — ED Provider Notes (Deleted)
Medical screening examination/treatment/procedure(s) were performed by non-physician practitioner and as supervising physician I was immediately available for consultation/collaboration.  Richarda Blade, MD 10/02/13 [phone]

## 2013-10-02 NOTE — ED Provider Notes (Signed)
  Face-to-face evaluation   History: Hematuria for 2 days. Mild left flank pain. The pain in the flank has resolved as of 19:00. He denies nausea, vomiting, weakness, or dizziness. He has a history of prostate cancer  Physical exam: Alert, elderly man in no apparent distress. Abdomen is soft and nontender. Heart regular rate and rhythm. No murmur   Medications  iohexol (OMNIPAQUE) 300 MG/ML solution 25 mL (not administered)  cefTRIAXone (ROCEPHIN) 1 g in dextrose 5 % 50 mL IVPB (1 g Intravenous New Bag/Given 10/02/13 1840)  sodium chloride 0.9 % bolus 500 mL (0 mLs Intravenous Stopped 10/02/13 1639)  iohexol (OMNIPAQUE) 300 MG/ML solution 100 mL (100 mLs Intravenous Contrast Given 10/02/13 1732)    Patient Vitals for the past 24 hrs:  BP Temp Temp src Pulse Resp SpO2 Height  10/02/13 1800 142/62 mmHg - - 64 18 100 % -  10/02/13 1700 138/59 mmHg - - 65 16 99 % -  10/02/13 1630 153/70 mmHg - - 60 18 100 % -  10/02/13 1600 170/78 mmHg - - - 31 - -  10/02/13 1530 156/72 mmHg - - 62 19 100 % -  10/02/13 1500 164/78 mmHg - - 66 20 100 % -  10/02/13 1459 127/69 mmHg - - 65 21 100 % -  10/02/13 1430 127/69 mmHg - - 62 20 100 % -  10/02/13 1400 141/62 mmHg - - 74 21 100 % -  10/02/13 1337 146/62 mmHg - - 65 18 98 % -  10/02/13 1203 134/77 mmHg 99 F (37.2 C) Oral 81 18 99 % 5\' 6"  (1.676 m)       Assessment- evaluation is consistent with a urinary tract infection. A culture has been ordered. The patient is stable for discharge with outpatient treatment.  Nursing Notes Reviewed/ Care Coordinated Applicable Imaging Reviewed Interpretation of Laboratory Data incorporated into ED treatment  The patient appears reasonably screened and/or stabilized for discharge and I doubt any other medical condition or other [facility] requiring further screening, evaluation, or treatment in the ED at this time prior to discharge.  Plan: Home Medications- Keflex; Home Treatments- rest; return here if the  recommended treatment, does not improve the symptoms; Recommended follow up- Urology 4-5 days   Medical screening examination/treatment/procedure(s) were conducted as a shared visit with non-physician practitioner(s) and myself.  I personally evaluated the patient during the encounter   Richarda Blade, MD 10/03/13 [phone]

## 2013-10-02 NOTE — ED Notes (Signed)
Pt back from CT

## 2013-10-02 NOTE — ED Provider Notes (Signed)
CSN: [id number]     Arrival date & time 10/02/13  1159 History   First MD Initiated Contact with Patient 10/02/13 1330     Chief Complaint  Patient presents with  . Hematuria   HPI Comments: Patient presents to the [facility] ED with his wife for new onset hematuria.  Patient's wife reports that the patient is demented but she has seen the hematuria herself and notes that the patient has also stated that he has had some mild flank pain.   Patient is a former smoker.  He smoked 1 ppd.  He quit smoking in 1985.  Patient denies dysuria, frequency, urgency, testicular pain, and a history of kidney stones.    Patient is a 80 y.o. male presenting with hematuria. The history is provided by the patient. No language interpreter was used.  Hematuria This is a new problem. The current episode started in the past 7 days. The problem occurs intermittently. The problem has been gradually worsening. Pertinent negatives include no abdominal pain, anorexia, arthralgias, change in bowel habit, chest pain, chills, congestion, coughing, diaphoresis, fatigue, fever, headaches, joint swelling, myalgias, nausea, neck pain, numbness, rash, sore throat, swollen glands, vertigo, visual change, vomiting or weakness.    Past Medical History  Diagnosis Date  . Prostate cancer 07/18/11    bx=adenocarcinoma=PSA=12.45,gleason=4+5=9,4+3=7,4+4=8, 3+3=6 & 3+7,volume=156.35cc  . Diabetes mellitus   . Hypertension   . Hypercholesterolemia   . BPH (benign prostatic hyperplasia)     with nocturia,  . ED (erectile dysfunction)   . Hx of radiation therapy 10/23/11 - 12/18/11    prostate, seminal vesicles, lymph nodes  . Mild dementia    Past Surgical History  Procedure Laterality Date  . Prostate biopsy  07/18/2011    gleason 4+5=9  . Rotator cuff repair  2011  . Fracture surgery  35 years    right leg/rod placement  . Cardioversion N/A 05/11/2013    Procedure: CARDIOVERSION;  Surgeon: Laverda Page, MD;  Location: [facility] ENDOSCOPY;   Service: Cardiovascular;  Laterality: N/A;   Family History  Problem Relation Age of Onset  . Pneumonia Mother   . Cancer Father    History  Substance Use Topics  . Smoking status: Former Smoker -- 1.00 packs/day for 10 years    Types: Cigarettes    Quit date: 09/04/1972  . Smokeless tobacco: Never Used     Comment: quit 20 years ago  . Alcohol Use: 0.0 oz/week     Comment: occasionally    Review of Systems  Unable to perform ROS: Dementia  Constitutional: Negative for fever, chills, diaphoresis and fatigue.  HENT: Negative for congestion and sore throat.   Respiratory: Negative for cough, chest tightness and shortness of breath.   Cardiovascular: Negative for chest pain, palpitations and leg swelling.  Gastrointestinal: Negative for nausea, vomiting, abdominal pain, anorexia and change in bowel habit.  Genitourinary: Positive for hematuria and flank pain. Negative for dysuria, urgency, frequency, enuresis, difficulty urinating and testicular pain.  Musculoskeletal: Negative for arthralgias, joint swelling, myalgias and neck pain.  Skin: Negative for rash.  Neurological: Negative for vertigo, weakness, numbness and headaches.  Psychiatric/Behavioral: Negative for confusion.  All other systems reviewed and are negative.     Allergies  Review of patient's allergies indicates no known allergies.  Home Medications   Prior to Admission medications   Medication Sig Start Date End Date Taking? Authorizing Provider  atorvastatin (LIPITOR) 20 MG tablet Take 20 mg by mouth daily.   Yes Historical Provider, MD  donepezil (ARICEPT) 10 MG tablet Take 1 tablet (10 mg total) by mouth at bedtime. 05/13/13  Yes Vikram R Penumalli, MD  ENSURE (ENSURE) Take 237 mLs by mouth 3 (three) times daily.    Yes Historical Provider, MD  Iron-FA-B Cmp-C-Biot-Probiotic (FUSION PLUS) CAPS Take 1 capsule by mouth daily.   Yes Historical Provider, MD  Memantine HCl ER (NAMENDA XR) 28 MG CP24 Take 28 mg by  mouth daily. 05/13/13  Yes Penni Bombard, MD  rivaroxaban (XARELTO) 20 MG TABS tablet Take 20 mg by mouth daily with supper.   Yes Historical Provider, MD  sitaGLIPtin (JANUVIA) 100 MG tablet Take 100 mg by mouth daily.   Yes Historical Provider, MD  Travoprost, BAK Free, (TRAVATAN) 0.004 % SOLN ophthalmic solution Place 1 drop into both eyes at bedtime.   Yes Historical Provider, MD  traZODone (DESYREL) 50 MG tablet Take 50 mg by mouth at bedtime.   Yes Historical Provider, MD   BP 127/69  Pulse 65  Temp(Src) 99 F (37.2 C) (Oral)  Resp 21  Ht 5\' 6"  (1.676 m)  SpO2 100% Physical Exam  Nursing note and vitals reviewed. Constitutional: He is oriented to person, place, and time. He appears well-developed and well-nourished. No distress.  HENT:  Head: Normocephalic and atraumatic.  Mouth/Throat: Oropharynx is clear and moist. No oropharyngeal exudate.  Eyes: Conjunctivae and EOM are normal. Pupils are equal, round, and reactive to light.  Neck: Normal range of motion. Neck supple. No JVD present. No thyromegaly present.  Cardiovascular: Normal rate, regular rhythm, normal heart sounds and intact distal pulses.  Exam reveals no gallop and no friction rub.   No murmur heard. Pulmonary/Chest: Effort normal and breath sounds normal. No respiratory distress. He has no wheezes. He has no rales. He exhibits no tenderness.  Abdominal: Soft. Bowel sounds are normal. He exhibits no distension and no mass. There is no tenderness. There is no rebound and no guarding.  Musculoskeletal: Normal range of motion.  Lymphadenopathy:    He has no cervical adenopathy.  Neurological: He is alert and oriented to person, place, and time.  Skin: Skin is warm and dry. He is not diaphoretic.  Psychiatric: He has a normal mood and affect. His behavior is normal. Judgment and thought content normal.    ED Course  Procedures (including critical care time) Labs Review Labs Reviewed  URINALYSIS, ROUTINE W  REFLEX MICROSCOPIC - Abnormal; Notable for the following:    Color, Urine RED (*)    APPearance TURBID (*)    Hgb urine dipstick LARGE (*)    Protein, ur 100 (*)    Leukocytes, UA LARGE (*)    All other components within normal limits  CBC WITH DIFFERENTIAL - Abnormal; Notable for the following:    RBC 3.57 (*)    Hemoglobin 11.4 (*)    HCT 33.7 (*)    Neutrophils Relative % 81 (*)    Lymphocytes Relative 11 (*)    All other components within normal limits  PROTIME-INR - Abnormal; Notable for the following:    Prothrombin Time 18.2 (*)    INR 1.51 (*)    All other components within normal limits  BASIC METABOLIC PANEL - Abnormal; Notable for the following:    Glucose, Bld 164 (*)    GFR calc non Af Amer 57 (*)    GFR calc Af Amer 66 (*)    All other components within normal limits  URINE MICROSCOPIC-ADD ON - Abnormal; Notable for the following:  Bacteria, UA FEW (*)    All other components within normal limits  URINE CULTURE  POC OCCULT BLOOD, ED    Imaging Review No results found.   EKG Interpretation None      MDM   Final diagnoses:  Hematuria  UTI (lower urinary tract infection)   Patient presents to the [facility] ED with new onset hematuria.  Patient denies any dysuria at this time and he endorses some mild flank pain; however, wife states she hasn't heard her husband complain of flank pain at home.  UA here shows large hemoglobin and large leukocytes.  There are few bacteria and there are epithelial cells present potentially indicating possible contamination.  Will culture urine and will treat for infection.  CBC shows mild anemia which the patient appears to have at baseline, but no frank leukocytosis.  PT/INR seems to be mildly elevated at this time.    BMP is unremarkable.  CT abdomen and pelvis with contrast pending.  Dr. Lacinda Axon and I made the decision to use contrast dye as he is complaining of potential painless hematuria and wanted to be able to look for renal and  bladder carcinoma as he is at an increased risk due to prior smoking history.  Differential includes Renal CA, Bladder CA, and Kidney stone.  I will sign this patient out to Quincy Carnes PA-C and Dr. Eulis Foster at this time.          Kenard Gower, PA-C 10/02/13 [place], MD 10/22/13 1420

## 2013-10-04 LAB — URINE CULTURE
Colony Count: >=100000
SPECIAL REQUESTS: NORMAL

## 2013-10-05 ENCOUNTER — Telehealth (HOSPITAL_COMMUNITY): Payer: Self-pay

## 2013-10-05 NOTE — Progress Notes (Signed)
ED Antimicrobial Stewardship Positive Culture Follow Up   Craig Henderson is an 80 y.o. male who presented to [facility] on 10/02/2013 with a chief complaint of hematuria, pain with urination, flank pain  Chief Complaint  Patient presents with  . Hematuria    Recent Results (from the past 720 hour(s))  URINE CULTURE     Status: None   Collection Time    10/02/13  2:28 PM      Result Value Ref Range Status   Specimen Description URINE, CLEAN CATCH   Final   Special Requests Normal   Final   Culture  Setup Time     Final   Value: 10/02/2013 23:01     Performed at [facility]     Final   Value: >=100,000 COLONIES/ML     Performed at [facility]     Final   Value: ENTEROBACTER CLOACAE     Performed at [facility]   Report Status 10/04/2013 FINAL   Final   Organism ID, Bacteria ENTEROBACTER CLOACAE   Final    [x]  Treated with Keflex, organism resistant to prescribed antimicrobial  [name] who presented to the [facility] on 6/26 with hematuria, pain with urination, and flank pain. An abdominal CT was done to evaluate for cancer and showed an enlarged prostate and possible cystitis. The patient was given a dose of Rocephin in the ED and sent home on Keflex.  Cultures grew out Enterobacter that is R-Keflex - will need to adjust antibiotics.   New antibiotic prescription: D/c Keflex and change to Cipro 250 mg bid x 7 days  ED Provider: Hyman Bible, PA-C  Lawson Radar 10/05/2013, 10:06 AM Infectious Diseases Pharmacist Phone# [phone]

## 2013-10-05 NOTE — ED Notes (Signed)
Post ED Visit - Positive Culture Follow-up: Successful Patient Follow-Up  Culture assessed and recommendations reviewed by: []  Wes Gerton, Pharm.D., BCPS []  Heide Guile, Pharm.D., BCPS [x]  Alycia Rossetti, Pharm.D., BCPS []  [place], Pharm.D., BCPS, AAHIVP []  Legrand Como, Pharm.D., BCPS, AAHIVP  Positive urine culture  []  Patient discharged without antimicrobial prescription and treatment is now indicated []  Organism is resistant to prescribed ED discharge antimicrobial []  Patient with positive blood cultures  Changes discussed with ED provider:[name] PA New antibiotic prescription cipro 250 mg bid x 7days , stop keflex.  Called to [facility] [phone]  Contacted patient, date 10/05/2013, time 11:18   [name] 10/05/2013, 11:16 AM

## 2014-06-01 ENCOUNTER — Other Ambulatory Visit: Payer: Self-pay | Admitting: Diagnostic Neuroimaging

## 2014-06-18 ENCOUNTER — Inpatient Hospital Stay (HOSPITAL_COMMUNITY)
Admission: EM | Admit: 2014-06-18 | Discharge: 2014-06-24 | DRG: 871 | Disposition: A | Discharge status: Skilled Nursing Facility | Payer: Medicare Other | Attending: Internal Medicine | Admitting: Internal Medicine

## 2014-06-18 ENCOUNTER — Emergency Department (HOSPITAL_COMMUNITY): Payer: Medicare Other

## 2014-06-18 ENCOUNTER — Encounter (HOSPITAL_COMMUNITY): Payer: Self-pay | Admitting: Emergency Medicine

## 2014-06-18 DIAGNOSIS — I313 Pericardial effusion (noninflammatory): Secondary | ICD-10-CM | POA: Diagnosis present

## 2014-06-18 DIAGNOSIS — Z7901 Long term (current) use of anticoagulants: Secondary | ICD-10-CM

## 2014-06-18 DIAGNOSIS — J189 Pneumonia, unspecified organism: Secondary | ICD-10-CM | POA: Diagnosis present

## 2014-06-18 DIAGNOSIS — I4891 Unspecified atrial fibrillation: Secondary | ICD-10-CM | POA: Diagnosis present

## 2014-06-18 DIAGNOSIS — E1151 Type 2 diabetes mellitus with diabetic peripheral angiopathy without gangrene: Secondary | ICD-10-CM | POA: Diagnosis present

## 2014-06-18 DIAGNOSIS — L02619 Cutaneous abscess of unspecified foot: Secondary | ICD-10-CM | POA: Diagnosis present

## 2014-06-18 DIAGNOSIS — E11621 Type 2 diabetes mellitus with foot ulcer: Secondary | ICD-10-CM | POA: Diagnosis present

## 2014-06-18 DIAGNOSIS — E780 Pure hypercholesterolemia: Secondary | ICD-10-CM | POA: Diagnosis present

## 2014-06-18 DIAGNOSIS — Z8546 Personal history of malignant neoplasm of prostate: Secondary | ICD-10-CM | POA: Diagnosis not present

## 2014-06-18 DIAGNOSIS — L97529 Non-pressure chronic ulcer of other part of left foot with unspecified severity: Secondary | ICD-10-CM | POA: Diagnosis present

## 2014-06-18 DIAGNOSIS — I10 Essential (primary) hypertension: Secondary | ICD-10-CM | POA: Diagnosis present

## 2014-06-18 DIAGNOSIS — E785 Hyperlipidemia, unspecified: Secondary | ICD-10-CM | POA: Diagnosis present

## 2014-06-18 DIAGNOSIS — I4892 Unspecified atrial flutter: Secondary | ICD-10-CM | POA: Diagnosis present

## 2014-06-18 DIAGNOSIS — R4781 Slurred speech: Secondary | ICD-10-CM

## 2014-06-18 DIAGNOSIS — I483 Typical atrial flutter: Secondary | ICD-10-CM | POA: Diagnosis not present

## 2014-06-18 DIAGNOSIS — R197 Diarrhea, unspecified: Secondary | ICD-10-CM | POA: Diagnosis present

## 2014-06-18 DIAGNOSIS — L089 Local infection of the skin and subcutaneous tissue, unspecified: Secondary | ICD-10-CM | POA: Diagnosis present

## 2014-06-18 DIAGNOSIS — N400 Enlarged prostate without lower urinary tract symptoms: Secondary | ICD-10-CM | POA: Diagnosis present

## 2014-06-18 DIAGNOSIS — F0390 Unspecified dementia without behavioral disturbance: Secondary | ICD-10-CM | POA: Diagnosis present

## 2014-06-18 DIAGNOSIS — Z923 Personal history of irradiation: Secondary | ICD-10-CM | POA: Diagnosis not present

## 2014-06-18 DIAGNOSIS — R5383 Other fatigue: Secondary | ICD-10-CM

## 2014-06-18 DIAGNOSIS — E860 Dehydration: Secondary | ICD-10-CM | POA: Diagnosis present

## 2014-06-18 DIAGNOSIS — IMO0002 Reserved for concepts with insufficient information to code with codable children: Secondary | ICD-10-CM | POA: Diagnosis present

## 2014-06-18 DIAGNOSIS — I472 Ventricular tachycardia: Secondary | ICD-10-CM | POA: Diagnosis present

## 2014-06-18 DIAGNOSIS — A419 Sepsis, unspecified organism: Secondary | ICD-10-CM | POA: Diagnosis present

## 2014-06-18 DIAGNOSIS — E1165 Type 2 diabetes mellitus with hyperglycemia: Secondary | ICD-10-CM | POA: Diagnosis present

## 2014-06-18 DIAGNOSIS — N390 Urinary tract infection, site not specified: Secondary | ICD-10-CM | POA: Diagnosis present

## 2014-06-18 DIAGNOSIS — Z6822 Body mass index (BMI) 22.0-22.9, adult: Secondary | ICD-10-CM | POA: Diagnosis not present

## 2014-06-18 DIAGNOSIS — E440 Moderate protein-calorie malnutrition: Secondary | ICD-10-CM | POA: Diagnosis present

## 2014-06-18 DIAGNOSIS — L97509 Non-pressure chronic ulcer of other part of unspecified foot with unspecified severity: Secondary | ICD-10-CM

## 2014-06-18 DIAGNOSIS — Z87891 Personal history of nicotine dependence: Secondary | ICD-10-CM

## 2014-06-18 DIAGNOSIS — IMO0001 Reserved for inherently not codable concepts without codable children: Secondary | ICD-10-CM | POA: Diagnosis present

## 2014-06-18 DIAGNOSIS — I248 Other forms of acute ischemic heart disease: Secondary | ICD-10-CM | POA: Diagnosis present

## 2014-06-18 DIAGNOSIS — I3139 Other pericardial effusion (noninflammatory): Secondary | ICD-10-CM | POA: Diagnosis present

## 2014-06-18 LAB — COMPREHENSIVE METABOLIC PANEL
ALBUMIN: 3.4 g/dL — AB (ref 3.5–5.2)
ALT: 24 U/L (ref 0–53)
ANION GAP: 10 (ref 5–15)
AST: 28 U/L (ref 0–37)
Alkaline Phosphatase: 68 U/L (ref 39–117)
BUN: 20 mg/dL (ref 6–23)
CALCIUM: 8.6 mg/dL (ref 8.4–10.5)
CO2: 20 mmol/L (ref 19–32)
CREATININE: 1.42 mg/dL — AB (ref 0.50–1.35)
Chloride: 106 mmol/L (ref 96–112)
GFR calc Af Amer: 52 mL/min — ABNORMAL LOW (ref >90)
GFR, EST NON AFRICAN AMERICAN: 44 mL/min — AB (ref >90)
GLUCOSE: 243 mg/dL — AB (ref 70–99)
Potassium: 4.5 mmol/L (ref 3.5–5.1)
SODIUM: 136 mmol/L (ref 135–145)
TOTAL PROTEIN: 7 g/dL (ref 6.0–8.3)
Total Bilirubin: 1.2 mg/dL (ref 0.3–1.2)

## 2014-06-18 LAB — URINALYSIS, ROUTINE W REFLEX MICROSCOPIC
BILIRUBIN URINE: NEGATIVE
Glucose, UA: NEGATIVE mg/dL
KETONES UR: 15 mg/dL — AB
NITRITE: NEGATIVE
PH: 5.5 (ref 5.0–8.0)
Protein, ur: 100 mg/dL — AB
Specific Gravity, Urine: 1.013 (ref 1.005–1.030)
Urobilinogen, UA: 1 mg/dL (ref 0.0–1.0)

## 2014-06-18 LAB — CBC WITH DIFFERENTIAL/PLATELET
Basophils Absolute: 0 10*3/uL (ref 0.0–0.1)
Basophils Relative: 0 % (ref 0–1)
EOS ABS: 0 10*3/uL (ref 0.0–0.7)
Eosinophils Relative: 0 % (ref 0–5)
HCT: 34 % — ABNORMAL LOW (ref 39.0–52.0)
Hemoglobin: 11.4 g/dL — ABNORMAL LOW (ref 13.0–17.0)
LYMPHS ABS: 1.2 10*3/uL (ref 0.7–4.0)
Lymphocytes Relative: 6 % — ABNORMAL LOW (ref 12–46)
MCH: 32 pg (ref 26.0–34.0)
MCHC: 33.5 g/dL (ref 30.0–36.0)
MCV: 95.5 fL (ref 78.0–100.0)
MONOS PCT: 4 % (ref 3–12)
Monocytes Absolute: 0.8 10*3/uL (ref 0.1–1.0)
Neutro Abs: 17.7 10*3/uL — ABNORMAL HIGH (ref 1.7–7.7)
Neutrophils Relative %: 90 % — ABNORMAL HIGH (ref 43–77)
PLATELETS: 198 10*3/uL (ref 150–400)
RBC: 3.56 MIL/uL — ABNORMAL LOW (ref 4.22–5.81)
RDW: 13.5 % (ref 11.5–15.5)
WBC: 19.7 10*3/uL — AB (ref 4.0–10.5)

## 2014-06-18 LAB — I-STAT CG4 LACTIC ACID, ED: Lactic Acid, Venous: 3.02 mmol/L (ref 0.5–2.0)

## 2014-06-18 LAB — I-STAT TROPONIN, ED: TROPONIN I, POC: 0.02 ng/mL (ref 0.00–0.08)

## 2014-06-18 LAB — URINE MICROSCOPIC-ADD ON

## 2014-06-18 MED ORDER — VANCOMYCIN HCL IN DEXTROSE 1-5 GM/200ML-% IV SOLN
1000.0000 mg | Freq: Once | INTRAVENOUS | Status: AC
  Administered 2014-06-18: 1000 mg via INTRAVENOUS
  Filled 2014-06-18: qty 200

## 2014-06-18 MED ORDER — VANCOMYCIN HCL IN DEXTROSE 750-5 MG/150ML-% IV SOLN
750.0000 mg | INTRAVENOUS | Status: DC
  Administered 2014-06-19 – 2014-06-21 (×3): 750 mg via INTRAVENOUS
  Filled 2014-06-18 (×4): qty 150

## 2014-06-18 MED ORDER — DONEPEZIL HCL 10 MG PO TABS
10.0000 mg | ORAL_TABLET | Freq: Every day | ORAL | Status: DC
  Administered 2014-06-18 – 2014-06-23 (×6): 10 mg via ORAL
  Filled 2014-06-18 (×7): qty 1

## 2014-06-18 MED ORDER — PIPERACILLIN-TAZOBACTAM 3.375 G IVPB
3.3750 g | Freq: Three times a day (TID) | INTRAVENOUS | Status: DC
  Administered 2014-06-19 – 2014-06-21 (×7): 3.375 g via INTRAVENOUS
  Filled 2014-06-18 (×10): qty 50

## 2014-06-18 MED ORDER — PIPERACILLIN-TAZOBACTAM 3.375 G IVPB 30 MIN
3.3750 g | Freq: Once | INTRAVENOUS | Status: AC
  Administered 2014-06-18: 3.375 g via INTRAVENOUS
  Filled 2014-06-18: qty 50

## 2014-06-18 MED ORDER — TAMSULOSIN HCL 0.4 MG PO CAPS
0.8000 mg | ORAL_CAPSULE | Freq: Every day | ORAL | Status: DC
  Administered 2014-06-18 – 2014-06-23 (×6): 0.8 mg via ORAL
  Filled 2014-06-18 (×7): qty 2

## 2014-06-18 MED ORDER — ACETAMINOPHEN 650 MG RE SUPP
650.0000 mg | Freq: Once | RECTAL | Status: AC
  Administered 2014-06-18: 650 mg via RECTAL
  Filled 2014-06-18: qty 1

## 2014-06-18 MED ORDER — SODIUM CHLORIDE 0.9 % IV BOLUS (SEPSIS)
500.0000 mL | Freq: Once | INTRAVENOUS | Status: AC
  Administered 2014-06-18: 500 mL via INTRAVENOUS

## 2014-06-18 MED ORDER — SODIUM CHLORIDE 0.9 % IV BOLUS (SEPSIS)
2000.0000 mL | Freq: Once | INTRAVENOUS | Status: AC
  Administered 2014-06-18: 2000 mL via INTRAVENOUS

## 2014-06-18 MED ORDER — RIVAROXABAN 20 MG PO TABS: 20.0000 mg | ORAL_TABLET | Freq: Every day | ORAL | Status: DC

## 2014-06-18 MED ORDER — RIVAROXABAN 20 MG PO TABS
20.0000 mg | ORAL_TABLET | Freq: Every day | ORAL | Status: DC
  Administered 2014-06-18 – 2014-06-19 (×2): 20 mg via ORAL
  Filled 2014-06-18 (×3): qty 1

## 2014-06-18 NOTE — Progress Notes (Signed)
EDCM spoke with patient and patient's family at bedside daughter Colletta [place] [phone] and wife Desiree Lucy [phone].  Patient lives at home with his wife.  Patient does not have any home health services at this time and never has had home health services.  Patient has a walker, cane, elevated toilet seat, urinal and wheelchair at home.  Patient does not wear oxygen at home.  Patient's wife reports patient is usually able to walk but all this week patient has been getting weaker and less mobile, stays in bed most of the time.  Patient requires assistance with ADL's.  Patient's family reports patient has dementia.  Patient's wife reports she is with him all the time.  Patient's family expressed interest in SNF.  [facility] consulted EDSW.  EDCM provided patient's family with list of home health agencies in [place] explained services and also provided a list of private duty nursing agencies.  EDCM explained that private duty nursing will be an out of pocket expense for patient.  Patient's family thankful for resources.  No further EDCM needs at this time.

## 2014-06-18 NOTE — Progress Notes (Signed)
ANTIBIOTIC CONSULT NOTE - INITIAL  Pharmacy Consult for vancomcyin/zosyn Indication: cellulitis  No Known Allergies  Patient Measurements: Weight: 168 lb (76.204 kg) Adjusted Body Weight:   Vital Signs: Temp: 102 F (38.9 C) (03/11 1641) Temp Source: Rectal (03/11 1641) BP: 110/60 mmHg (03/11 1641) Pulse Rate: 143 (03/11 1641) Intake/Output from previous day:   Intake/Output from this shift:    Labs: No results for input(s): WBC, HGB, PLT, LABCREA, CREATININE in the last 72 hours. CrCl cannot be calculated (Unknown ideal weight.). No results for input(s): VANCOTROUGH, VANCOPEAK, VANCORANDOM, GENTTROUGH, GENTPEAK, GENTRANDOM, TOBRATROUGH, TOBRAPEAK, TOBRARND, AMIKACINPEAK, AMIKACINTROU, AMIKACIN in the last 72 hours.   Microbiology: No results found for this or any previous visit (from the past 720 hour(s)).  Medical History: Past Medical History  Diagnosis Date  . Prostate cancer 07/18/11    bx=adenocarcinoma=PSA=12.45,gleason=4+5=9,4+3=7,4+4=8, 3+3=6 & 3+7,volume=156.35cc  . Diabetes mellitus   . Hypertension   . Hypercholesterolemia   . BPH (benign prostatic hyperplasia)     with nocturia,  . ED (erectile dysfunction)   . Hx of radiation therapy 10/23/11 - 12/18/11    prostate, seminal vesicles, lymph nodes  . Mild dementia    Assessment: 65 YOM with lethargy, slurred speech.  Pharmacy asked to dose vancomycin and zosyn for cellulitis of toe that is swollen and red  3/11 >> vancomycin  >> 3 11>> zosyn >>    Tmax: 102 WBCs: elevated Renal: Scr elevated, Normalized CrCl  = 79ml/min  3/11 blood: 3/11 urine:  3/11 influenza PCR:   Goal of Therapy:  Vancomycin trough level 10-15 mcg/ml  Plan:   Vancomycin 1gm x 1 in ED then 750mg  IV q24h  Check vancomycin trough as indicated  Zosyn 3.375gm 3.375gm IV q8h over 4h infusion  Monitor renal function  Suggest use cellulitis or diabetic foot infection order set to guide antibiotic therapy  Doreene Eland,  PharmD, BCPS.   Pager: [phone]  06/18/2014,4:56 PM

## 2014-06-18 NOTE — ED Notes (Signed)
Pt comes in for lethargic and slurred speech. Per family pt has dementia and been lethargic for couple days but this morning wife thinks, is when his slurred speech started.  Pt completely immobile and dependent

## 2014-06-18 NOTE — ED Notes (Signed)
EDP made aware of CG4 lactic result.

## 2014-06-18 NOTE — ED Notes (Signed)
Hospitalist MD at bedside. 

## 2014-06-18 NOTE — H&P (Signed)
PCP:  Elyn Peers, MD  Cardiology  [facility]  Chief Complaint:  Fatigue, poor appetite  HPI: Craig Henderson is a 81 y.o. male   has a past medical history of Prostate cancer (07/18/11); Diabetes mellitus; Hypertension; Hypercholesterolemia; BPH (benign prostatic hyperplasia); ED (erectile dysfunction); radiation therapy (10/23/11 - 12/18/11); and Mild dementia.   Presented with 3 days of progressive fatigue and lethargy this morning he also started to have slurred speech. He has stopped eating but have been drinking water and Ensure.  Patient has Ulcer on the left foot wife noticed it this week. Patient's wife reports subjective fever some cough. On arrival to ER he was noted to have Left foot ulcer that seemed infected. CXR worrisome for PNA. Patient was noted to be in A.fib with RVR with hr up to 160's. Patietn had been cardioverted in the past by [facility] He has hx of a.flutter on xarelto. Patietn met sepsis criteria with fever 102, HR 163, RR 26 and hypotension down to 95/60. He was given 3.5L of NS and started on Vanc and Zosyn. Currently vitals have stabilized. Initially CCM was consulted but given improvement Medical admition was recommended.  Patient is diabetic but has not been checking his glucose at home. HE has significant dementia. Family was interested in placemetn.    Hospitalist was called for admission for  Sepsis, due to diabetic foot ulcer and CAP  Review of Systems:    Pertinent positives include: Fevers, chills, fatigue, slurred speech  Constitutional:  No weight loss, night sweats,  weight loss  HEENT:  No headaches, Difficulty swallowing,Tooth/dental problems,Sore throat,  No sneezing, itching, ear ache, nasal congestion, post nasal drip,  Cardio-vascular:  No chest pain, Orthopnea, PND, anasarca, dizziness, palpitations.no Bilateral lower extremity swelling  GI:  No heartburn, indigestion, abdominal pain, nausea, vomiting, diarrhea, change in bowel habits,  loss of appetite, melena, blood in stool, hematemesis Resp:  no shortness of breath at rest. No dyspnea on exertion, No excess mucus, no productive cough, No non-productive cough, No coughing up of blood.No change in color of mucus.No wheezing. Skin:  no rash or lesions. No jaundice GU:  no dysuria, change in color of urine, no urgency or frequency. No straining to urinate.  No flank pain.  Musculoskeletal:  No joint pain or no joint swelling. No decreased range of motion. No back pain.  Psych:  No change in mood or affect. No depression or anxiety. No memory loss.  Neuro: no localizing neurological complaints, no tingling, no weakness, no double vision, no gait abnormality, no slurred speech, no confusion  Otherwise ROS are negative except for above, 10 systems were reviewed  Past Medical History: Past Medical History  Diagnosis Date  . Prostate cancer 07/18/11    bx=adenocarcinoma=PSA=12.45,gleason=4+5=9,4+3=7,4+4=8, 3+3=6 & 3+7,volume=156.35cc  . Diabetes mellitus   . Hypertension   . Hypercholesterolemia   . BPH (benign prostatic hyperplasia)     with nocturia,  . ED (erectile dysfunction)   . Hx of radiation therapy 10/23/11 - 12/18/11    prostate, seminal vesicles, lymph nodes  . Mild dementia    Past Surgical History  Procedure Laterality Date  . Prostate biopsy  07/18/2011    gleason 4+5=9  . Rotator cuff repair  2011  . Fracture surgery  35 years    right leg/rod placement  . Cardioversion N/A 05/11/2013    Procedure: CARDIOVERSION;  Surgeon: Laverda Page, MD;  Location: [facility];  Service: Cardiovascular;  Laterality: N/A;     Medications: Prior  to Admission medications   Medication Sig Start Date End Date Taking? Authorizing Provider  atorvastatin (LIPITOR) 20 MG tablet Take 20 mg by mouth daily.   Yes Historical Provider, MD  donepezil (ARICEPT) 10 MG tablet Take 1 tablet (10 mg total) by mouth at bedtime. 05/13/13  Yes Vikram R Penumalli, MD  ENSURE  (ENSURE) Take 237 mLs by mouth 3 (three) times daily.    Yes Historical Provider, MD  finasteride (PROSCAR) 5 MG tablet Take 1 tablet by mouth daily. 06/13/14  Yes Historical Provider, MD  Iron-FA-B Cmp-C-Biot-Probiotic (FUSION PLUS) CAPS Take 1 capsule by mouth daily.   Yes Historical Provider, MD  NAMENDA XR 28 MG CP24 24 hr capsule TAKE ONE CAPSULE BY MOUTH EVERY DAY 06/01/14  Yes Penni Bombard, MD  rivaroxaban (XARELTO) 20 MG TABS tablet Take 20 mg by mouth daily with supper.   Yes Historical Provider, MD  sitaGLIPtin (JANUVIA) 100 MG tablet Take 100 mg by mouth daily.   Yes Historical Provider, MD  tamsulosin (FLOMAX) 0.4 MG CAPS capsule Take 0.8 mg by mouth at bedtime. 05/17/14  Yes Historical Provider, MD  Travoprost, BAK Free, (TRAVATAN) 0.004 % SOLN ophthalmic solution Place 1 drop into both eyes at bedtime.   Yes Historical Provider, MD  traZODone (DESYREL) 50 MG tablet Take 50 mg by mouth at bedtime.   Yes Historical Provider, MD  cephALEXin (KEFLEX) 500 MG capsule Take 1 capsule (500 mg total) by mouth 4 (four) times daily. Patient not taking: Reported on 06/18/2014 10/02/13   Daleen Bo, MD    Allergies:  No Known Allergies  Social History:  Ambulatory walker  Lives at home  With family     reports that he quit smoking about 41 years ago. His smoking use included Cigarettes. He has a 10 pack-year smoking history. He has never used smokeless tobacco. He reports that he drinks alcohol. He reports that he does not use illicit drugs.    Family History: family history includes Cancer in his father; Pneumonia in his mother.    Physical Exam: Patient Vitals for the past 24 hrs:  BP Temp Temp src Pulse Resp SpO2 Weight  06/18/14 1929 - 98.4 F (36.9 C) Rectal - - - -  06/18/14 1900 107/61 mmHg - - (!) 127 21 100 % -  06/18/14 1845 95/65 mmHg - - (!) 139 22 100 % -  06/18/14 1800 121/70 mmHg - - (!) 138 16 100 % -  06/18/14 1730 (!) 152/109 mmHg - - (!) 144 26 100 % -    06/18/14 1718 136/91 mmHg - - (!) 123 23 100 % -  06/18/14 1651 - - - - - 95 % -  06/18/14 1642 - - - - - - 76.204 kg (168 lb)  06/18/14 1641 110/60 mmHg 102 F (38.9 C) Rectal (!) 143 25 (!) 74 % -  06/18/14 1639 110/60 mmHg - - (!) 163 23 (!) 76 % -  06/18/14 1638 - - - - 26 - -  06/18/14 1637 - - - - 21 - -  06/18/14 1636 - - - - 23 - -  06/18/14 1635 95/60 mmHg - - - 23 - -    1. General:  in No Acute distress 2. Psychological: Alert but not Oriented 3. Head/ENT:    Dry Mucous Membranes                          Head Non traumatic, neck  supple                            Poor Dentition 4. SKIN:   decreased Skin turgor,  Skin clean Dry Left foot great toe appears to be swollen no evidence of fluctuance 5. Heart: Regular rate and rhythm no Murmur, Rub or gallop 6. Lungs:  no wheezes some crackles   7. Abdomen: Soft, non-tender, Non distended 8. Lower extremities: no clubbing, cyanosis, or edema 9. Neurologically moving all 4 extremities equally not fully following instructions but appears to be grossly intact after being given any fluids speech has improved 10. MSK: Normal range of motion  body mass index is 27.13 kg/(m^2).   Labs on Admission:   Results for orders placed or performed during the hospital encounter of 06/18/14 (from the past 24 hour(s))  CBC WITH DIFFERENTIAL     Status: Abnormal   Collection Time: 06/18/14  5:20 PM  Result Value Ref Range   WBC 19.7 (H) 4.0 - 10.5 K/uL   RBC 3.56 (L) 4.22 - 5.81 MIL/uL   Hemoglobin 11.4 (L) 13.0 - 17.0 g/dL   HCT 34.0 (L) 39.0 - 52.0 %   MCV 95.5 78.0 - 100.0 fL   MCH 32.0 26.0 - 34.0 pg   MCHC 33.5 30.0 - 36.0 g/dL   RDW 13.5 11.5 - 15.5 %   Platelets 198 150 - 400 K/uL   Neutrophils Relative % 90 (H) 43 - 77 %   Neutro Abs 17.7 (H) 1.7 - 7.7 K/uL   Lymphocytes Relative 6 (L) 12 - 46 %   Lymphs Abs 1.2 0.7 - 4.0 K/uL   Monocytes Relative 4 3 - 12 %   Monocytes Absolute 0.8 0.1 - 1.0 K/uL   Eosinophils Relative 0 0  - 5 %   Eosinophils Absolute 0.0 0.0 - 0.7 K/uL   Basophils Relative 0 0 - 1 %   Basophils Absolute 0.0 0.0 - 0.1 K/uL  Comprehensive metabolic panel     Status: Abnormal   Collection Time: 06/18/14  5:20 PM  Result Value Ref Range   Sodium 136 135 - 145 mmol/L   Potassium 4.5 3.5 - 5.1 mmol/L   Chloride 106 96 - 112 mmol/L   CO2 20 19 - 32 mmol/L   Glucose, Bld 243 (H) 70 - 99 mg/dL   BUN 20 6 - 23 mg/dL   Creatinine, Ser 1.42 (H) 0.50 - 1.35 mg/dL   Calcium 8.6 8.4 - 10.5 mg/dL   Total Protein 7.0 6.0 - 8.3 g/dL   Albumin 3.4 (L) 3.5 - 5.2 g/dL   AST 28 0 - 37 U/L   ALT 24 0 - 53 U/L   Alkaline Phosphatase 68 39 - 117 U/L   Total Bilirubin 1.2 0.3 - 1.2 mg/dL   GFR calc non Af Amer 44 (L) >90 mL/min   GFR calc Af Amer 52 (L) >90 mL/min   Anion gap 10 5 - 15  I-Stat CG4 Lactic Acid, ED     Status: Abnormal   Collection Time: 06/18/14  5:31 PM  Result Value Ref Range   Lactic Acid, Venous 3.02 (HH) 0.5 - 2.0 mmol/L   Comment NOTIFIED PHYSICIAN   Urinalysis with microscopic     Status: Abnormal   Collection Time: 06/18/14  6:05 PM  Result Value Ref Range   Color, Urine YELLOW YELLOW   APPearance TURBID (A) CLEAR   Specific Gravity, Urine 1.013 1.005 -  1.030   pH 5.5 5.0 - 8.0   Glucose, UA NEGATIVE NEGATIVE mg/dL   Hgb urine dipstick LARGE (A) NEGATIVE   Bilirubin Urine NEGATIVE NEGATIVE   Ketones, ur 15 (A) NEGATIVE mg/dL   Protein, ur 100 (A) NEGATIVE mg/dL   Urobilinogen, UA 1.0 0.0 - 1.0 mg/dL   Nitrite NEGATIVE NEGATIVE   Leukocytes, UA LARGE (A) NEGATIVE  Urine microscopic-add on     Status: None   Collection Time: 06/18/14  6:05 PM  Result Value Ref Range   Urine-Other URINALYSIS PERFORMED ON SUPERNATANT   I-stat troponin, ED     Status: None   Collection Time: 06/18/14  6:50 PM  Result Value Ref Range   Troponin i, poc 0.02 0.00 - 0.08 ng/mL   Comment 3            UA no evidence of UTI, increased ketones and protein  Lab Results  Component Value Date    HGBA1C 10.9* 03/05/2012    CrCl cannot be calculated (Unknown ideal weight.).  BNP (last 3 results) No results for input(s): PROBNP in the last 8760 hours.  Other results:  I have pearsonaly reviewed this: ECG REPORT  Rate: 142  Rhythm: A.fib w RVR ST&T Change: NO ischemia   Filed Weights   06/18/14 1642  Weight: 76.204 kg (168 lb)    Cultures:    Component Value Date/Time   SDES URINE, CLEAN CATCH 10/02/2013 1428   SPECREQUEST Normal 10/02/2013 1428   CULT  10/02/2013 1428    ENTEROBACTER CLOACAE Performed at [facility] 10/04/2013 FINAL 10/02/2013 1428     Radiological Exams on Admission: Dg Chest Port 1 View  06/18/2014   CLINICAL DATA:  Lethargy.  Cough for several days.  Fever.  EXAM: PORTABLE CHEST - 1 VIEW  COMPARISON:  01/25/2013  FINDINGS: Normal heart size. There is no pleural effusion identified. Left midlung and left base airspace opacities identified, worrisome for pneumonia. Right lung is clear. The visualized osseous structures appear intact.  IMPRESSION: 1. Left midlung and left base opacities worrisome for pneumonia.   Electronically Signed   By: Kerby Moors M.D.   On: 06/18/2014 17:43   Dg Foot Complete Left  06/18/2014   CLINICAL DATA:  Redness impossible infection LEFT great toe for 1 week, unable ambulate past 2 days  EXAM: LEFT FOOT - COMPLETE 3+ VIEW  COMPARISON:  None  FINDINGS: Osseous demineralization.  Soft tissue swelling great toe.  No acute fracture, dislocation or definite bone destruction.  Joint spaces preserved.  IMPRESSION: No definite acute osseous abnormalities.   Electronically Signed   By: Lavonia Dana M.D.   On: 06/18/2014 19:59    Chart has been reviewed  Assessment/Plan  81 yo male with history of dementia, diabetes poorly controlled, history of atrial fibrillation status post cardioversion on Xarelto presenting with poor by mouth intake cough and slurred speech as well as left great toe swelling was  found to be septic likely secondary to combination of pneumonia and toe infection. Initially was found to be in A. fib with RVR which responded well to IV fluids.   Present on Admission:  . Sepsis - continue broad-spectrum antibiotics including vancomycin and Zosyn, IV fluids, check pro-calcitonin await results of blood cultures as well as urine culture. Most likely source is lungs versus toe infection  . Hypertension - given soft blood pressures will not administer any blood pressure medications at this point hold Proscar  . Diabetes mellitus type  2, uncontrolled - uncontrolled patient has not been checking his insulin sure that he isn't taking his medications. Will write for sliding scale monitor blood sugar closely given poor by mouth intake will monitor blood sugar frequently   . Diabetic foot ulcer  - patient has significant swelling of his left great toe. Worrisome for infectious process. Plain imaging did not show any evidence of osteomyelitis. This point will admit for broad-spectrum antibiotics. He'll likely benefit from orthopedics consult in the morning to see if she would benefit from operative intervention at some point  . Dementia -stable continue to monitor continue home medications  . CAP (community acquired pneumonia) -  - will admit for treatment of CAP will start on appropriate antibiotic coverage.   Obtain sputum cultures, blood cultures if febrile or if decompensates.  Provide oxygen as needed.  . Atrial flutter - patient was initially in A. fib with RVR in the setting of sepsis. Responded to IV fluids. Patient probably had some dehydration given poor by mouth intake recently. Will obtain echogram cycle cardiac enzymes and monitor. Once blood pressures improve would consider initiating rate control agent, right now will treat underlying etiology of tachycardia such as sepsis Slurred speech in a setting of sepsis poor by mouth intake dehydration otherwise exam nonfocal. Patient already  on Xarelto for history of A. fib. We will obtain CT without contrast to evaluate. Patient already improving with IV fluids. Debility contributing to patient's inability to care for himself. Will have PT received evaluation. Family has requested placement of possible.  Prophylaxis: Xarelto  CODE STATUS:  FULL CODE as per discussion with family   Other plan as per orders.  I have spent a total of 65 min on this admissionCase has been discussed With CCM  Craig Henderson 06/18/2014, 8:30 PM  [facility]  Pager [phone]   after 2 AM please page floor coverage PA If 7AM-7PM, please contact the day team taking care of the patient  [email or web address]  Password [email or web address]

## 2014-06-18 NOTE — Progress Notes (Signed)
CSW met with pt's family at bedside. During the first part of interview pt was not present. He was transported to xray. Family confirms that the pt presents to [facility] due to feeling lethargic and slurred speech.   Family informed CSW that pt lives at home in [place] with his wife. According to family, pt is not able to complete any of his ADL's independently. She says that the pt cannot control his bowel movements, has issues walking because of his R knee, and also has diabetes. Daughter stated that the pt has a wound on his foot, which is also causing him difficulties.  Daughter states that wife is the pt's primary support. However, due to her own health and old age she is not able take the best care of him. Daughter informed CSW that the pt falls often, and that wife is not able to pick him. Daughter states that she lives 10 minutes away from the pt, and visits him at least x2 a week. Daughter states the pt's POA is his son, in [place].  Daughter stated that the pt is also a wanderer, and that 2 years ago there was a silver alert put in place for the pt.  At this time daughter expresses concern that the pt will not receive the proper care at home. She is worried that while the pt is at home with his wife, his needs will not be met. She states that the wife cannot lift him and she worries the two together at home will deteriorate. Family made it clear that they want pt to be in a facility.  During the close of interview the pt was transported back into the room. CSW asked pt was her interested in going to a facility, and the pt stated that he did not want to go. Pt is not interested in a facility at this time. However, the pt has a hx of mild dementia.   CSW gave pt a list of SNF. Family states they do not have any questions.  Wife/Florine [phone] Daughter/Stephanie [phone]  Willette Brace [phone] ED CSW 06/18/2014 10:44 PM

## 2014-06-18 NOTE — ED Provider Notes (Signed)
CSN: [id number]     Arrival date & time 06/18/14  1620 History   First MD Initiated Contact with Patient 06/18/14 1638     Chief Complaint  Patient presents with  . lethargic   . Aphasia     (Consider location/radiation/quality/duration/timing/severity/associated sxs/prior Treatment) HPI Patient has chronic medical illness with dementia and decreased mobility. At baseline the patient ambulates with a walker. He does have continence problems with chronic urinary and fecal incontinence. His wife reports over the past 2 days however he has been lethargic. This is not typical for him. They had not documented a fever at home. She reports she has not been taking his blood sugars either. He has not had any vomiting. She denies any increased in stool output. She denies she's had any coughing or shortness of breath that seem any more than would be a baseline for him. I have noted an area on his toe that is significantly swollen and she reports that has been developing for about a week. She reports they tried to clip his toenails at home however it is too difficult because they are so thick. She reports they had made a podiatrist appointment for him. The toe has been becoming increasingly swollen and red. The patient does awaken although he is ill in appearance. He denies that he's having any localizing pain. He reports he just feels badly. Past Medical History  Diagnosis Date  . Prostate cancer 07/18/11    bx=adenocarcinoma=PSA=12.45,gleason=4+5=9,4+3=7,4+4=8, 3+3=6 & 3+7,volume=156.35cc  . Diabetes mellitus   . Hypertension   . Hypercholesterolemia   . BPH (benign prostatic hyperplasia)     with nocturia,  . ED (erectile dysfunction)   . Hx of radiation therapy 10/23/11 - 12/18/11    prostate, seminal vesicles, lymph nodes  . Mild dementia    Past Surgical History  Procedure Laterality Date  . Prostate biopsy  07/18/2011    gleason 4+5=9  . Rotator cuff repair  2011  . Fracture surgery  35 years      right leg/rod placement  . Cardioversion N/A 05/11/2013    Procedure: CARDIOVERSION;  Surgeon: Laverda Page, MD;  Location: [facility] ENDOSCOPY;  Service: Cardiovascular;  Laterality: N/A;   Family History  Problem Relation Age of Onset  . Pneumonia Mother   . Cancer Father    History  Substance Use Topics  . Smoking status: Former Smoker -- 1.00 packs/day for 10 years    Types: Cigarettes    Quit date: 09/04/1972  . Smokeless tobacco: Never Used     Comment: quit 20 years ago  . Alcohol Use: 0.0 oz/week     Comment: occasionally    Review of Systems 10 Systems reviewed and are negative for acute change except as noted in the HPI.    Allergies  Review of patient's allergies indicates no known allergies.  Home Medications   Prior to Admission medications   Medication Sig Start Date End Date Taking? Authorizing Provider  atorvastatin (LIPITOR) 20 MG tablet Take 20 mg by mouth daily.   Yes Historical Provider, MD  donepezil (ARICEPT) 10 MG tablet Take 1 tablet (10 mg total) by mouth at bedtime. 05/13/13  Yes Vikram R Penumalli, MD  ENSURE (ENSURE) Take 237 mLs by mouth 3 (three) times daily.    Yes Historical Provider, MD  finasteride (PROSCAR) 5 MG tablet Take 1 tablet by mouth daily. 06/13/14  Yes Historical Provider, MD  Iron-FA-B Cmp-C-Biot-Probiotic (FUSION PLUS) CAPS Take 1 capsule by mouth daily.   Yes  Historical Provider, MD  NAMENDA XR 28 MG CP24 24 hr capsule TAKE ONE CAPSULE BY MOUTH EVERY DAY 06/01/14  Yes Penni Bombard, MD  rivaroxaban (XARELTO) 20 MG TABS tablet Take 20 mg by mouth daily with supper.   Yes Historical Provider, MD  sitaGLIPtin (JANUVIA) 100 MG tablet Take 100 mg by mouth daily.   Yes Historical Provider, MD  tamsulosin (FLOMAX) 0.4 MG CAPS capsule Take 0.8 mg by mouth at bedtime. 05/17/14  Yes Historical Provider, MD  Travoprost, BAK Free, (TRAVATAN) 0.004 % SOLN ophthalmic solution Place 1 drop into both eyes at bedtime.   Yes Historical Provider, MD   traZODone (DESYREL) 50 MG tablet Take 50 mg by mouth at bedtime.   Yes Historical Provider, MD  cephALEXin (KEFLEX) 500 MG capsule Take 1 capsule (500 mg total) by mouth 4 (four) times daily. Patient not taking: Reported on 06/18/2014 10/02/13   Daleen Bo, MD   BP 107/61 mmHg  Pulse 127  Temp(Src) 98.4 F (36.9 C) (Rectal)  Resp 21  Wt 168 lb (76.204 kg)  SpO2 100% Physical Exam  Constitutional: He appears lethargic.  The patient is thin and deconditioned in appearance. He is ill in appearance.  HENT:  Head: Normocephalic and atraumatic.  Right Ear: External ear normal.  Left Ear: External ear normal.  Nose: Nose normal.  Mucous membranes are dry.  Eyes: EOM are normal. Pupils are equal, round, and reactive to light.  Neck: Neck supple.  Cardiovascular: Intact distal pulses.   Heart is tachycardic and irregularly irregular.  Pulmonary/Chest: Effort normal and breath sounds normal. No respiratory distress.  Good air flow no significant wheezes or rail is present.  Abdominal: Soft. Bowel sounds are normal. He exhibits no distension. There is no tenderness.  Musculoskeletal: He exhibits no edema.  Neurological: He appears lethargic. Coordination normal.  The patient is slightly somnolent but does awaken and will answer simple questions. He has a nonfocal neurologic examination he is spontaneously using both arms and legs to move.  Skin: Skin is warm, dry and intact. No rash noted.  Psychiatric: He has a normal mood and affect.          ED Course  Procedures (including critical care time) Labs Review Labs Reviewed  CBC WITH DIFFERENTIAL/PLATELET - Abnormal; Notable for the following:    WBC 19.7 (*)    RBC 3.56 (*)    Hemoglobin 11.4 (*)    HCT 34.0 (*)    Neutrophils Relative % 90 (*)    Neutro Abs 17.7 (*)    Lymphocytes Relative 6 (*)    All other components within normal limits  COMPREHENSIVE METABOLIC PANEL - Abnormal; Notable for the following:    Glucose,  Bld 243 (*)    Creatinine, Ser 1.42 (*)    Albumin 3.4 (*)    GFR calc non Af Amer 44 (*)    GFR calc Af Amer 52 (*)    All other components within normal limits  URINALYSIS, ROUTINE W REFLEX MICROSCOPIC - Abnormal; Notable for the following:    APPearance TURBID (*)    Hgb urine dipstick LARGE (*)    Ketones, ur 15 (*)    Protein, ur 100 (*)    Leukocytes, UA LARGE (*)    All other components within normal limits  I-STAT CG4 LACTIC ACID, ED - Abnormal; Notable for the following:    Lactic Acid, Venous 3.02 (*)    All other components within normal limits  CULTURE, BLOOD (ROUTINE X  2)  CULTURE, BLOOD (ROUTINE X 2)  URINE CULTURE  URINE MICROSCOPIC-ADD ON  INFLUENZA PANEL BY PCR (TYPE A & B, H1N1)  I-STAT TROPOININ, ED    Imaging Review Dg Chest Port 1 View  06/18/2014   CLINICAL DATA:  Lethargy.  Cough for several days.  Fever.  EXAM: PORTABLE CHEST - 1 VIEW  COMPARISON:  01/25/2013  FINDINGS: Normal heart size. There is no pleural effusion identified. Left midlung and left base airspace opacities identified, worrisome for pneumonia. Right lung is clear. The visualized osseous structures appear intact.  IMPRESSION: 1. Left midlung and left base opacities worrisome for pneumonia.   Electronically Signed   By: Kerby Moors M.D.   On: 06/18/2014 17:43     EKG Interpretation   Date/Time:  Friday June 18 2014 16:35:23 EST Ventricular Rate:  142 PR Interval:  188 QRS Duration: 76 QT Interval:  333 QTC Calculation: 512 R Axis:   -27 Text Interpretation:  Supraventricular tachycardia Low voltage, extremity  leads ST depression, probably rate related agree. Confirmed by Johnney Killian,  MD, Jeannie Done [phone]) on 06/18/2014 5:22:41 PM     Recheck 19:35. Patient is improved in appearance. He is sitting with eyes open now. He shows no respiratory distress. He answers simple questions. Heart rate ranges from 100-110. H of fibrillation on the monitor. Blood pressure is now reading  120s/60s.  Consult: Dr. Gilberto Better intensivist. The patient will be evaluated in the emergency department.  CRITICAL CARE Performed by: Charlesetta Shanks   Total critical care time: 45  Critical care time was exclusive of separately billable procedures and treating other patients.  Critical care was necessary to treat or prevent imminent or life-threatening deterioration.  Critical care was time spent personally by me on the following activities: development of treatment plan with patient and/or surrogate as well as nursing, discussions with consultants, evaluation of patient's response to treatment, examination of patient, obtaining history from patient or surrogate, ordering and performing treatments and interventions, ordering and review of laboratory studies, ordering and review of radiographic studies, pulse oximetry and re-evaluation of patient's condition. MDM   Final diagnoses:  Sepsis, due to unspecified organism  Toe infection   The patient presents with a septic appearance. On physical examination most likely etiology appears to be a significant infection of the great toe on the left. As reflected by the image's there appears to be potential necrotic areas within the toe wound. The patient has shown improvement with fluid management and antibiotic administration. At this time he will be admitted for further evaluation of infectious source and treatment.    Charlesetta Shanks, MD 06/18/14 [phone]

## 2014-06-18 NOTE — Progress Notes (Signed)
Clinical Social Work Department BRIEF PSYCHOSOCIAL ASSESSMENT 06/18/2014  Patient:  Craig Henderson, Craig Henderson     Account Number:  [id number]     Admit date:  06/18/2014  Clinical Social Worker:  Tilda Burrow, CLINICAL SOCIAL WORKER  Date/Time:  06/18/2014 11:13 PM  Referred by:  CSW  Date Referred:  06/18/2014 Referred for  SNF Placement   Other Referral:   Interview type:  Family Other interview type:   Pt was was present during the last portion of the interview. Prior to joing the interview, the pt was having an xray conducted.    PSYCHOSOCIAL DATA Living Status:  FAMILY Admitted from facility:   Level of care:   Primary support name:  Florine Doffing Primary support relationship to patient:  SPOUSE Degree of support available:   The pt is currently living at home with his wife. However, she states that she would like pt to be admitted into a facility.    Per daughter, the pt's wife is not able to properly care for pt due toher own health and age. Family appears to be extremely concerned and supportive. However, they wish for the pt to live at a facility at this time.    CURRENT CONCERNS Current Concerns  Adjustment to Illness  Other - See comment   Other Concerns:   Family expressed concerns that wife cannot care for pt. Also, they state that pt does not have the phsyical capacity to care for himself. Pt also has a hx of mild demenita.    SOCIAL WORK ASSESSMENT / PLAN CSW met with pt's family at bedside. During the first part of interview pt was not present. He was transported to xray. Family confirms that the pt presents to [facility] due to feeling lethargic and slurred speech.      Family informed CSW that pt lives at home in [place] with his wife. According to family, pt is not able to complete any of his ADL's independently. She says that the pt cannot control his bowel movements, has issues walking because of his R knee, and also has diabetes. Daughter stated that the pt  has a wound on his foot, which is also causing him difficulties.      Daughter states that wife is the pt's primary support. However, due to her own health and old age she is not able take the best care of him. Daughter informed CSW that the pt falls often, and that wife is not able to pick him. Daughter states that she lives 10 minutes away from the pt, and visits him at least x2 a week. Daughter states the pt's POA is his son, in [place].      Daughter stated that the pt is also a wanderer, and that 2 years ago there was a silver alert put in place for the pt.      At this time daughter expresses concern that the pt will not receive the proper care at home. She is worried that while the pt is at home with his wife, his needs will not be met. She states that the wife cannot lift him and she worries the two together at home will deteriorate. Family made it clear that they want pt to be in a facility.      During the close of interview the pt was transported back into the room. CSW asked pt was her interested in going to a facility, and the pt stated that he did not want to go. Pt is not interested in  a facility at this time. However, the pt has a hx of mild dementia.      CSW gave pt a list of SNF. Family states they do not have any questions.      Wife/Florine [phone]   Daughter/Stephanie [phone]                               Assessment/plan status:  Information/Referral to [employer] Other assessment/ plan:   Information/referral to community resources:   CSW gave family a list of SNF facilities.    PATIENT'S/FAMILY'S RESPONSE TO PLAN OF CARE: At this time daughter expresses concern that the pt will not receive the proper care at home. She is worried that while the pt is at home with his wife, his needs will not be met. She states that the wife cannot lift him and she worries the two together at home will deteriorate. Family made it clear that they want pt to be  in a facility.       Willette Brace [phone] ED CSW 06/18/2014 11:23 PM

## 2014-06-19 ENCOUNTER — Inpatient Hospital Stay (HOSPITAL_COMMUNITY): Payer: Medicare Other

## 2014-06-19 DIAGNOSIS — L02612 Cutaneous abscess of left foot: Secondary | ICD-10-CM

## 2014-06-19 DIAGNOSIS — I4891 Unspecified atrial fibrillation: Secondary | ICD-10-CM

## 2014-06-19 DIAGNOSIS — E11621 Type 2 diabetes mellitus with foot ulcer: Secondary | ICD-10-CM | POA: Diagnosis present

## 2014-06-19 DIAGNOSIS — IMO0002 Reserved for concepts with insufficient information to code with codable children: Secondary | ICD-10-CM | POA: Diagnosis present

## 2014-06-19 DIAGNOSIS — L97509 Non-pressure chronic ulcer of other part of unspecified foot with unspecified severity: Secondary | ICD-10-CM

## 2014-06-19 DIAGNOSIS — F0390 Unspecified dementia without behavioral disturbance: Secondary | ICD-10-CM

## 2014-06-19 DIAGNOSIS — E1165 Type 2 diabetes mellitus with hyperglycemia: Secondary | ICD-10-CM | POA: Diagnosis present

## 2014-06-19 DIAGNOSIS — E08621 Diabetes mellitus due to underlying condition with foot ulcer: Secondary | ICD-10-CM

## 2014-06-19 DIAGNOSIS — L02619 Cutaneous abscess of unspecified foot: Secondary | ICD-10-CM | POA: Diagnosis present

## 2014-06-19 DIAGNOSIS — I483 Typical atrial flutter: Secondary | ICD-10-CM

## 2014-06-19 LAB — STREP PNEUMONIAE URINARY ANTIGEN: Strep Pneumo Urinary Antigen: NEGATIVE

## 2014-06-19 LAB — CBC WITH DIFFERENTIAL/PLATELET
Basophils Absolute: 0 10*3/uL (ref 0.0–0.1)
Basophils Relative: 0 % (ref 0–1)
EOS ABS: 0 10*3/uL (ref 0.0–0.7)
Eosinophils Relative: 0 % (ref 0–5)
HCT: 29 % — ABNORMAL LOW (ref 39.0–52.0)
Hemoglobin: 10.1 g/dL — ABNORMAL LOW (ref 13.0–17.0)
Lymphocytes Relative: 9 % — ABNORMAL LOW (ref 12–46)
Lymphs Abs: 1.2 10*3/uL (ref 0.7–4.0)
MCH: 32.5 pg (ref 26.0–34.0)
MCHC: 34.8 g/dL (ref 30.0–36.0)
MCV: 93.2 fL (ref 78.0–100.0)
MONOS PCT: 7 % (ref 3–12)
Monocytes Absolute: 0.9 10*3/uL (ref 0.1–1.0)
Neutro Abs: 10.7 10*3/uL — ABNORMAL HIGH (ref 1.7–7.7)
Neutrophils Relative %: 84 % — ABNORMAL HIGH (ref 43–77)
Platelets: DECREASED 10*3/uL (ref 150–400)
RBC: 3.11 MIL/uL — AB (ref 4.22–5.81)
RDW: 13.4 % (ref 11.5–15.5)
SMEAR REVIEW: DECREASED
WBC: 12.8 10*3/uL — ABNORMAL HIGH (ref 4.0–10.5)

## 2014-06-19 LAB — COMPREHENSIVE METABOLIC PANEL
ALBUMIN: 2.8 g/dL — AB (ref 3.5–5.2)
ALT: 18 U/L (ref 0–53)
AST: 19 U/L (ref 0–37)
Alkaline Phosphatase: 53 U/L (ref 39–117)
Anion gap: 5 (ref 5–15)
BUN: 14 mg/dL (ref 6–23)
CALCIUM: 8.3 mg/dL — AB (ref 8.4–10.5)
CHLORIDE: 112 mmol/L (ref 96–112)
CO2: 25 mmol/L (ref 19–32)
CREATININE: 1.16 mg/dL (ref 0.50–1.35)
GFR calc Af Amer: 66 mL/min — ABNORMAL LOW (ref >90)
GFR, EST NON AFRICAN AMERICAN: 57 mL/min — AB (ref >90)
Glucose, Bld: 127 mg/dL — ABNORMAL HIGH (ref 70–99)
Potassium: 4.1 mmol/L (ref 3.5–5.1)
Sodium: 142 mmol/L (ref 135–145)
TOTAL PROTEIN: 6.2 g/dL (ref 6.0–8.3)
Total Bilirubin: UNDETERMINED mg/dL (ref 0.3–1.2)

## 2014-06-19 LAB — LIPID PANEL
Cholesterol: 130 mg/dL (ref 0–200)
HDL: 50 mg/dL (ref >39)
LDL CALC: 70 mg/dL (ref 0–99)
TRIGLYCERIDES: 52 mg/dL (ref <150)
Total CHOL/HDL Ratio: 2.6 RATIO
VLDL: 10 mg/dL (ref 0–40)

## 2014-06-19 LAB — TROPONIN I
TROPONIN I: 0.05 ng/mL — AB (ref <0.031)
TROPONIN I: 0.03 ng/mL (ref <0.031)
Troponin I: 0.04 ng/mL — ABNORMAL HIGH (ref <0.031)
Troponin I: 0.03 ng/mL (ref <0.031)

## 2014-06-19 LAB — SEDIMENTATION RATE: Sed Rate: 55 mm/hr — ABNORMAL HIGH (ref 0–16)

## 2014-06-19 LAB — LACTIC ACID, PLASMA: Lactic Acid, Venous: 1.7 mmol/L (ref 0.5–2.0)

## 2014-06-19 LAB — GLUCOSE, CAPILLARY
GLUCOSE-CAPILLARY: 113 mg/dL — AB (ref 70–99)
Glucose-Capillary: 120 mg/dL — ABNORMAL HIGH (ref 70–99)
Glucose-Capillary: 182 mg/dL — ABNORMAL HIGH (ref 70–99)

## 2014-06-19 LAB — INFLUENZA PANEL BY PCR (TYPE A & B)
H1N1FLUPCR: NOT DETECTED
INFLAPCR: NEGATIVE
INFLBPCR: NEGATIVE

## 2014-06-19 LAB — MRSA PCR SCREENING: MRSA BY PCR: NEGATIVE

## 2014-06-19 LAB — PROCALCITONIN: Procalcitonin: 4.68 ng/mL

## 2014-06-19 MED ORDER — ENSURE COMPLETE PO LIQD
237.0000 mL | Freq: Three times a day (TID) | ORAL | Status: DC
  Administered 2014-06-19 – 2014-06-24 (×14): 237 mL via ORAL
  Filled 2014-06-19 (×9): qty 237

## 2014-06-19 MED ORDER — IPRATROPIUM-ALBUTEROL 0.5-2.5 (3) MG/3ML IN SOLN: 3.0000 mL | RESPIRATORY_TRACT | Status: DC | PRN

## 2014-06-19 MED ORDER — MEMANTINE HCL ER 28 MG PO CP24
28.0000 mg | ORAL_CAPSULE | Freq: Every day | ORAL | Status: DC
  Administered 2014-06-19 – 2014-06-24 (×6): 28 mg via ORAL
  Filled 2014-06-19 (×6): qty 1

## 2014-06-19 MED ORDER — ATORVASTATIN CALCIUM 20 MG PO TABS
20.0000 mg | ORAL_TABLET | Freq: Every day | ORAL | Status: DC
  Administered 2014-06-19 – 2014-06-24 (×6): 20 mg via ORAL
  Filled 2014-06-19 (×6): qty 1

## 2014-06-19 MED ORDER — SODIUM CHLORIDE 0.9 % IV BOLUS (SEPSIS)
500.0000 mL | Freq: Once | INTRAVENOUS | Status: AC
  Administered 2014-06-19: 500 mL via INTRAVENOUS

## 2014-06-19 MED ORDER — METHYLPREDNISOLONE SODIUM SUCC 125 MG IJ SOLR
60.0000 mg | INTRAMUSCULAR | Status: DC
  Administered 2014-06-19 – 2014-06-20 (×2): 60 mg via INTRAVENOUS
  Filled 2014-06-19 (×2): qty 0.96

## 2014-06-19 MED ORDER — IPRATROPIUM-ALBUTEROL 0.5-2.5 (3) MG/3ML IN SOLN
3.0000 mL | Freq: Four times a day (QID) | RESPIRATORY_TRACT | Status: DC
Start: 2014-06-19 — End: 2014-06-19
  Administered 2014-06-19 (×2): 3 mL via RESPIRATORY_TRACT
  Filled 2014-06-19 (×2): qty 3

## 2014-06-19 MED ORDER — INSULIN ASPART 100 UNIT/ML ~~LOC~~ SOLN
0.0000 [IU] | SUBCUTANEOUS | Status: DC
  Administered 2014-06-19 (×3): 2 [IU] via SUBCUTANEOUS
  Administered 2014-06-19: 5 [IU] via SUBCUTANEOUS
  Administered 2014-06-20: 2 [IU] via SUBCUTANEOUS
  Administered 2014-06-20 (×2): 5 [IU] via SUBCUTANEOUS
  Administered 2014-06-20: 3 [IU] via SUBCUTANEOUS
  Administered 2014-06-20: 5 [IU] via SUBCUTANEOUS
  Administered 2014-06-21: 3 [IU] via SUBCUTANEOUS

## 2014-06-19 MED ORDER — RESOURCE THICKENUP CLEAR PO POWD
ORAL | Status: DC | PRN
  Administered 2014-06-20: via ORAL
  Filled 2014-06-19: qty 125

## 2014-06-19 MED ORDER — TRAZODONE HCL 50 MG PO TABS
50.0000 mg | ORAL_TABLET | Freq: Every day | ORAL | Status: DC
  Administered 2014-06-19 – 2014-06-23 (×6): 50 mg via ORAL
  Filled 2014-06-19 (×7): qty 1

## 2014-06-19 MED ORDER — SODIUM CHLORIDE 0.9 % IV SOLN
INTRAVENOUS | Status: AC
  Administered 2014-06-19: 01:00:00 via INTRAVENOUS

## 2014-06-19 MED ORDER — LATANOPROST 0.005 % OP SOLN
1.0000 [drp] | Freq: Every day | OPHTHALMIC | Status: DC
  Administered 2014-06-19 – 2014-06-23 (×6): 1 [drp] via OPHTHALMIC
  Filled 2014-06-19: qty 2.5

## 2014-06-19 MED ORDER — SODIUM CHLORIDE 0.9 % IV SOLN
INTRAVENOUS | Status: DC
  Administered 2014-06-19: 21:00:00 via INTRAVENOUS
  Administered 2014-06-19: 1000 mL via INTRAVENOUS
  Administered 2014-06-20 – 2014-06-21 (×3): via INTRAVENOUS

## 2014-06-19 MED ORDER — DM-GUAIFENESIN ER 30-600 MG PO TB12
1.0000 | ORAL_TABLET | Freq: Two times a day (BID) | ORAL | Status: DC
  Administered 2014-06-19 – 2014-06-24 (×11): 1 via ORAL
  Filled 2014-06-19 (×12): qty 1

## 2014-06-19 NOTE — Consult Note (Signed)
Reason for Consult:  Left great toe wound Referring Physician:   [facility]  Craig Henderson is an 81 y.o. male.  HPI:   81 yo male with diabetes who is being consulted on to assess a blister/wound involving his left great toe.  Past Medical History  Diagnosis Date  . Prostate cancer 07/18/11    bx=adenocarcinoma=PSA=12.45,gleason=4+5=9,4+3=7,4+4=8, 3+3=6 & 3+7,volume=156.35cc  . Diabetes mellitus   . Hypertension   . Hypercholesterolemia   . BPH (benign prostatic hyperplasia)     with nocturia,  . ED (erectile dysfunction)   . Hx of radiation therapy 10/23/11 - 12/18/11    prostate, seminal vesicles, lymph nodes  . Mild dementia     Past Surgical History  Procedure Laterality Date  . Prostate biopsy  07/18/2011    gleason 4+5=9  . Rotator cuff repair  2011  . Fracture surgery  35 years    right leg/rod placement  . Cardioversion N/A 05/11/2013    Procedure: CARDIOVERSION;  Surgeon: Laverda Page, MD;  Location: [facility] ENDOSCOPY;  Service: Cardiovascular;  Laterality: N/A;    Family History  Problem Relation Age of Onset  . Pneumonia Mother   . Cancer Father     Social History:  reports that he quit smoking about 41 years ago. His smoking use included Cigarettes. He has a 10 pack-year smoking history. He has never used smokeless tobacco. He reports that he drinks alcohol. He reports that he does not use illicit drugs.  Allergies: No Known Allergies  Medications: I have reviewed the patient's current medications.  Results for orders placed or performed during the hospital encounter of 06/18/14 (from the past 48 hour(s))  CBC WITH DIFFERENTIAL     Status: Abnormal   Collection Time: 06/18/14  5:20 PM  Result Value Ref Range   WBC 19.7 (H) 4.0 - 10.5 K/uL   RBC 3.56 (L) 4.22 - 5.81 MIL/uL   Hemoglobin 11.4 (L) 13.0 - 17.0 g/dL   HCT 34.0 (L) 39.0 - 52.0 %   MCV 95.5 78.0 - 100.0 fL   MCH 32.0 26.0 - 34.0 pg   MCHC 33.5 30.0 - 36.0 g/dL   RDW 13.5 11.5 - 15.5 %    Platelets 198 150 - 400 K/uL   Neutrophils Relative % 90 (H) 43 - 77 %   Neutro Abs 17.7 (H) 1.7 - 7.7 K/uL   Lymphocytes Relative 6 (L) 12 - 46 %   Lymphs Abs 1.2 0.7 - 4.0 K/uL   Monocytes Relative 4 3 - 12 %   Monocytes Absolute 0.8 0.1 - 1.0 K/uL   Eosinophils Relative 0 0 - 5 %   Eosinophils Absolute 0.0 0.0 - 0.7 K/uL   Basophils Relative 0 0 - 1 %   Basophils Absolute 0.0 0.0 - 0.1 K/uL  Comprehensive metabolic panel     Status: Abnormal   Collection Time: 06/18/14  5:20 PM  Result Value Ref Range   Sodium 136 135 - 145 mmol/L   Potassium 4.5 3.5 - 5.1 mmol/L   Chloride 106 96 - 112 mmol/L   CO2 20 19 - 32 mmol/L   Glucose, Bld 243 (H) 70 - 99 mg/dL   BUN 20 6 - 23 mg/dL   Creatinine, Ser 1.42 (H) 0.50 - 1.35 mg/dL   Calcium 8.6 8.4 - 10.5 mg/dL   Total Protein 7.0 6.0 - 8.3 g/dL   Albumin 3.4 (L) 3.5 - 5.2 g/dL   AST 28 0 - 37 U/L   ALT  24 0 - 53 U/L   Alkaline Phosphatase 68 39 - 117 U/L   Total Bilirubin 1.2 0.3 - 1.2 mg/dL   GFR calc non Af Amer 44 (L) >90 mL/min   GFR calc Af Amer 52 (L) >90 mL/min    Comment: (NOTE) The eGFR has been calculated using the CKD EPI equation. This calculation has not been validated in all clinical situations. eGFR's persistently <90 mL/min signify possible Chronic Kidney Disease.    Anion gap 10 5 - 15  I-Stat CG4 Lactic Acid, ED     Status: Abnormal   Collection Time: 06/18/14  5:31 PM  Result Value Ref Range   Lactic Acid, Venous 3.02 (HH) 0.5 - 2.0 mmol/L   Comment NOTIFIED PHYSICIAN   Urinalysis with microscopic     Status: Abnormal   Collection Time: 06/18/14  6:05 PM  Result Value Ref Range   Color, Urine YELLOW YELLOW   APPearance TURBID (A) CLEAR   Specific Gravity, Urine 1.013 1.005 - 1.030   pH 5.5 5.0 - 8.0   Glucose, UA NEGATIVE NEGATIVE mg/dL   Hgb urine dipstick LARGE (A) NEGATIVE   Bilirubin Urine NEGATIVE NEGATIVE   Ketones, ur 15 (A) NEGATIVE mg/dL   Protein, ur 100 (A) NEGATIVE mg/dL   Urobilinogen,  UA 1.0 0.0 - 1.0 mg/dL   Nitrite NEGATIVE NEGATIVE   Leukocytes, UA LARGE (A) NEGATIVE  Urine microscopic-add on     Status: None   Collection Time: 06/18/14  6:05 PM  Result Value Ref Range   Urine-Other URINALYSIS PERFORMED ON SUPERNATANT     Comment: FIELD OBSCURED BY WBC'S  I-stat troponin, ED     Status: None   Collection Time: 06/18/14  6:50 PM  Result Value Ref Range   Troponin i, poc 0.02 0.00 - 0.08 ng/mL   Comment 3            Comment: Due to the release kinetics of cTnI, a negative result within the first hours of the onset of symptoms does not rule out myocardial infarction with certainty. If myocardial infarction is still suspected, repeat the test at appropriate intervals.   MRSA PCR Screening     Status: None   Collection Time: 06/19/14 12:20 AM  Result Value Ref Range   MRSA by PCR NEGATIVE NEGATIVE    Comment:        The GeneXpert MRSA Assay (FDA approved for NASAL specimens only), is one component of a comprehensive MRSA colonization surveillance program. It is not intended to diagnose MRSA infection nor to guide or monitor treatment for MRSA infections.   Glucose, capillary     Status: Abnormal   Collection Time: 06/19/14 12:40 AM  Result Value Ref Range   Glucose-Capillary 182 (H) 70 - 99 mg/dL  Procalcitonin - Baseline     Status: None   Collection Time: 06/19/14  2:22 AM  Result Value Ref Range   Procalcitonin 4.68 ng/mL    Comment:        Interpretation: PCT > 2 ng/mL: Systemic infection (sepsis) is likely, unless other causes are known. (NOTE)         ICU PCT Algorithm               Non ICU PCT Algorithm    ----------------------------     ------------------------------         PCT < 0.25 ng/mL                 PCT < 0.1 ng/mL  Stopping of antibiotics            Stopping of antibiotics       strongly encouraged.               strongly encouraged.    ----------------------------     ------------------------------       PCT level decrease  by               PCT < 0.25 ng/mL       >= 80% from peak PCT       OR PCT 0.25 - 0.5 ng/mL          Stopping of antibiotics                                             encouraged.     Stopping of antibiotics           encouraged.    ----------------------------     ------------------------------       PCT level decrease by              PCT >= 0.25 ng/mL       < 80% from peak PCT        AND PCT >= 0.5 ng/mL            Continuing antibiotics                                               encouraged.       Continuing antibiotics            encouraged.    ----------------------------     ------------------------------     PCT level increase compared          PCT > 0.5 ng/mL         with peak PCT AND          PCT >= 0.5 ng/mL             Escalation of antibiotics                                          strongly encouraged.      Escalation of antibiotics        strongly encouraged.   Troponin I (q 6hr x 3)     Status: Abnormal   Collection Time: 06/19/14  2:22 AM  Result Value Ref Range   Troponin I 0.05 (H) <0.031 ng/mL    Comment:        PERSISTENTLY INCREASED TROPONIN VALUES IN THE RANGE OF 0.04-0.49 ng/mL CAN BE SEEN IN:       -UNSTABLE ANGINA       -CONGESTIVE HEART FAILURE       -MYOCARDITIS       -CHEST TRAUMA       -ARRYHTHMIAS       -LATE PRESENTING MYOCARDIAL INFARCTION       -COPD   CLINICAL FOLLOW-UP RECOMMENDED.   Strep pneumoniae urinary antigen     Status: None   Collection Time: 06/19/14  4:40 AM  Result Value Ref Range   Strep Pneumo Urinary Antigen NEGATIVE NEGATIVE  Comment:        Infection due to S. pneumoniae cannot be absolutely ruled out since the antigen present may be below the detection limit of the test.   Sedimentation rate     Status: Abnormal   Collection Time: 06/19/14  4:46 AM  Result Value Ref Range   Sed Rate 55 (H) 0 - 16 mm/hr  Glucose, capillary     Status: Abnormal   Collection Time: 06/19/14  5:12 AM  Result Value Ref Range     Glucose-Capillary 120 (H) 70 - 99 mg/dL  Comprehensive metabolic panel     Status: Abnormal   Collection Time: 06/19/14  6:42 AM  Result Value Ref Range   Sodium 142 135 - 145 mmol/L   Potassium 4.1 3.5 - 5.1 mmol/L   Chloride 112 96 - 112 mmol/L   CO2 25 19 - 32 mmol/L   Glucose, Bld 127 (H) 70 - 99 mg/dL   BUN 14 6 - 23 mg/dL   Creatinine, Ser 1.16 0.50 - 1.35 mg/dL   Calcium 8.3 (L) 8.4 - 10.5 mg/dL   Total Protein 6.2 6.0 - 8.3 g/dL   Albumin 2.8 (L) 3.5 - 5.2 g/dL   AST 19 0 - 37 U/L   ALT 18 0 - 53 U/L   Alkaline Phosphatase 53 39 - 117 U/L   Total Bilirubin QUANTITY NOT SUFFICIENT, UNABLE TO PERFORM TEST 0.3 - 1.2 mg/dL   GFR calc non Af Amer 57 (L) >90 mL/min   GFR calc Af Amer 66 (L) >90 mL/min    Comment: (NOTE) The eGFR has been calculated using the CKD EPI equation. This calculation has not been validated in all clinical situations. eGFR's persistently <90 mL/min signify possible Chronic Kidney Disease.    Anion gap 5 5 - 15  CBC WITH DIFFERENTIAL     Status: Abnormal   Collection Time: 06/19/14  6:42 AM  Result Value Ref Range   WBC 12.8 (H) 4.0 - 10.5 K/uL    Comment: WHITE COUNT CONFIRMED ON SMEAR   RBC 3.11 (L) 4.22 - 5.81 MIL/uL   Hemoglobin 10.1 (L) 13.0 - 17.0 g/dL   HCT 29.0 (L) 39.0 - 52.0 %   MCV 93.2 78.0 - 100.0 fL   MCH 32.5 26.0 - 34.0 pg   MCHC 34.8 30.0 - 36.0 g/dL   RDW 13.4 11.5 - 15.5 %   Platelets PLATELETS APPEAR DECREASED 150 - 400 K/uL   Neutrophils Relative % 84 (H) 43 - 77 %   Lymphocytes Relative 9 (L) 12 - 46 %   Monocytes Relative 7 3 - 12 %   Eosinophils Relative 0 0 - 5 %   Basophils Relative 0 0 - 1 %   Neutro Abs 10.7 (H) 1.7 - 7.7 K/uL   Lymphs Abs 1.2 0.7 - 4.0 K/uL   Monocytes Absolute 0.9 0.1 - 1.0 K/uL   Eosinophils Absolute 0.0 0.0 - 0.7 K/uL   Basophils Absolute 0.0 0.0 - 0.1 K/uL   Smear Review      PLATELET CLUMPS NOTED ON SMEAR, COUNT APPEARS DECREASED  Troponin I (q 6hr x 3)     Status: Abnormal    Collection Time: 06/19/14  6:42 AM  Result Value Ref Range   Troponin I 0.04 (H) <0.031 ng/mL    Comment:        PERSISTENTLY INCREASED TROPONIN VALUES IN THE RANGE OF 0.04-0.49 ng/mL CAN BE SEEN IN:       -UNSTABLE ANGINA       -  CONGESTIVE HEART FAILURE       -MYOCARDITIS       -CHEST TRAUMA       -ARRYHTHMIAS       -LATE PRESENTING MYOCARDIAL INFARCTION       -COPD   CLINICAL FOLLOW-UP RECOMMENDED.   Glucose, capillary     Status: Abnormal   Collection Time: 06/19/14  8:16 AM  Result Value Ref Range   Glucose-Capillary 113 (H) 70 - 99 mg/dL    Ct Head Wo Contrast  06/19/2014   CLINICAL DATA:  81 year old male with slurred speech. Altered mental status. Three days of progressive fatigue and lethargy.  EXAM: CT HEAD WITHOUT CONTRAST  TECHNIQUE: Contiguous axial images were obtained from the base of the skull through the vertex without intravenous contrast.  COMPARISON:  03/04/2012  FINDINGS: Generalized atrophy and chronic small vessel ischemic change. 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 Small lacunar infarcts in the left basal ganglia. No intracranial hemorrhage, mass effect, or midline shift. The basilar cisterns are patent. No evidence of territorial infarct. No intracranial fluid collection. Calvarium is intact. There is mucosal thickening of the left maxillary sinus, left mastoid air cells, and left frontal sinus, new from prior. The mastoid air cells are well aerated.  IMPRESSION: 1. Atrophy and chronic small vessel ischemic change. 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 No acute intracranial process. 2. Left-sided sinusitis, possibly acute.   Electronically Signed   By: Jeb Levering M.D.   On: 06/19/2014 02:21   Dg Chest Port 1 View  06/18/2014   CLINICAL DATA:  Lethargy.  Cough for several days.  Fever.  EXAM: PORTABLE CHEST - 1 VIEW  COMPARISON:  01/25/2013  FINDINGS: Normal heart size. There is no pleural effusion identified. Left midlung and left  base airspace opacities identified, worrisome for pneumonia. Right lung is clear. The visualized osseous structures appear intact.  IMPRESSION: 1. Left midlung and left base opacities worrisome for pneumonia.   Electronically Signed   By: Kerby Moors M.D.   On: 06/18/2014 17:43   Dg Foot Complete Left  06/18/2014   CLINICAL DATA:  Redness impossible infection LEFT great toe for 1 week, unable ambulate past 2 days  EXAM: LEFT FOOT - COMPLETE 3+ VIEW  COMPARISON:  None  FINDINGS: Osseous demineralization.  Soft tissue swelling great toe.  No acute fracture, dislocation or definite bone destruction.  Joint spaces preserved.  IMPRESSION: No definite acute osseous abnormalities.   Electronically Signed   By: Lavonia Dana M.D.   On: 06/18/2014 19:59    ROS Blood pressure 143/63, pulse 96, temperature 97.7 F (36.5 C), temperature source Oral, resp. rate 18, height 5' 6"  (1.676 m), weight 63.368 kg (139 lb 11.2 oz), SpO2 94 %. Physical Exam  Musculoskeletal:       Feet:    Assessment/Plan: Left great toe wound with blister and loose nail 1)  No surgery needed at all.  I just removed the blister and placed a dressing.  This can be treated with local wound care and soaks.  Will re-assess tomorrow 3/13.  Dequandre Cordova Y 06/19/2014, 11:11 AM

## 2014-06-19 NOTE — Progress Notes (Signed)
Received Diabetes Coordinator consult. Patient admitted with foot ulcer, dementia. Noted that family would like patient to be discharged to a facility since it is difficult for his wife to take care of him at home.  Admitting blood sugar was 243 mg/dl. CBGs have done well since then, now at 113 mg/dl. Current meds: Novolog SENSITIVE correction scale every 4 hours.  HgbA1C is in progress. Recommend changing Novolog correction scale to TID & HS if patient is eating. Noted that patient has only required insulin for one dose since admission. Will continue to follow while in hospital and to see what discharge plans will be.  Harvel Ricks RN BSN CDE

## 2014-06-19 NOTE — Plan of Care (Signed)
Problem: Phase I Progression Outcomes Goal: Voiding-avoid urinary catheter unless indicated Outcome: Not Progressing Condom cath

## 2014-06-19 NOTE — Progress Notes (Signed)
TEAM 1 - Stepdown/ICU TEAM Progress Note  Craig Henderson MAU:[id number] DOB: January 28, 1930 DOA: 06/18/2014 PCP: Elyn Peers, MD  Admit HPI / Brief Narrative: Craig Henderson is a 81 y.o. male PMHx   has a past medical history of Prostate cancer (07/18/11); Diabetes mellitus; Hypertension; Hypercholesterolemia; BPH (benign prostatic hyperplasia); ED (erectile dysfunction); radiation therapy (10/23/11 - 12/18/11); and Mild dementia.   Presented with 3 days of progressive fatigue and lethargy this morning he also started to have slurred speech. He has stopped eating but have been drinking water and Ensure.  Patient has Ulcer on the left foot wife noticed it this week. Patient's wife reports subjective fever some cough. On arrival to ER he was noted to have Left foot ulcer that seemed infected. CXR worrisome for PNA. Patient was noted to be in A.fib with RVR with hr up to 160's. Patietn had been cardioverted in the past by [facility] He has hx of a.flutter on xarelto. Patietn met sepsis criteria with fever 102, HR 163, RR 26 and hypotension down to 95/60. He was given 3.5L of NS and started on Vanc and Zosyn. Currently vitals have stabilized. Initially CCM was consulted but given improvement Medical admition was recommended.  Patient is diabetic but has not been checking his glucose at home. HE has significant dementia. Family was interested in placement    Hospitalist was called for admission for Sepsis, due to diabetic foot ulcer and CAP   HPI/Subjective: 3/12 A/O 2, (does not know when, why). States he believes he is here secondary to residual illness from having the measles. States one month ago have the measles, however never sought care. States lives at home with his wife.   Assessment/Plan: Sepsis  - continue broad-spectrum antibiotics including vancomycin and Zosyn -Continue normal saline, 100 ml/hr -Lactic acid pending -Strep pneumo urinary antigen negative  Left  great toe abscess  -Continue current antibiotics -Consult orthopedic surgery  Diabetic foot ulcer  - patient has significant swelling of his left great toe. Plain imaging did not show any evidence of osteomyelitis.  -See left great toe abscess   CAP -Obtain sputum induced -Solu-Medrol 60 mg daily -Flutter valve -Mucinex DM  Hypertension  - Hold all BP medication  Atrial fibrillation/Atrial flutter  - patient was initially in A. fib with RVR in the setting of sepsis. Responded to IV fluids.  -Echocardiogram pending  -Mildly elevated cardiac enzymes most likely demand ischemia. Continue to trend and if they trend up will consult cardiology    Diabetes mellitus type 2, uncontrolled -Hemoglobin A1c pending -Lipid panel -Continue sensitive SSI  Dementia -Spoke with Wallis Bamberg (daughter) [phone] and her husband who stated that patient's mental status has been steadily declining since~2014. This has resulted in patient's driving privileges being revoked. They state that patient has episodes of confusion, not knowing where he is, not knowing why he is there. ,  -Worsening daughter feels that patient no longer able to care for himself at home and interested in having patient placed when ready discharge. -Consult to social work for help with placement discharge -Consult to chaplain for help in obtaining official HCPOA   Code Status: FULL Family Communication: no family present at time of exam Disposition Plan: SNF vs assisted living    Consultants: -Orthopedic surgery pending   Procedure/Significant Events: 3/11 PCXR;Left midlung and left base opacities worrisome for pneumonia. 3/11 left foot x-ray;No definite acute osseous abnormalities 3/12 CT head without contrast;. -Remote prior infarcts in the cerebellum and left  basal ganglia.  -No acute intracranial process.  Culture 3/11 blood culture pending 3/11 influenza panel pending 3/ 11 urine culture pending 3/12  strep pneumo urine antigen negative 3/12 the generic urine antigens pending  Antibiotics: Zosyn 3/12>> Vancomycin 3/11>>   DVT prophylaxis: Xarelto   Devices    LINES / TUBES:      Continuous Infusions: . sodium chloride 1,000 mL (06/19/14 1027)    Objective: VITAL SIGNS: Temp: 97.7 F (36.5 C) (03/12 0903) Temp Source: Oral (03/12 0903) BP: 147/85 mmHg (03/12 0903) Pulse Rate: 98 (03/12 0903) SPO2; FIO2:   Intake/Output Summary (Last 24 hours) at 06/19/14 1028 Last data filed at 06/19/14 1026  Gross per 24 hour  Intake 3711.66 ml  Output    200 ml  Net 3511.66 ml     Exam: General:A/O 2, (does not know when, why), significantly confused believes he has residual illness from measles, No acute respiratory distress Lungs: diffuse mild expiratory wheezing, bibasilar crackles,  Cardiovascular: Tachycardic, Regular rhythm without murmur gallop or rub normal S1 and S2 Abdomen: Nontender, nondistended, soft, bowel sounds positive, no rebound, no ascites, no appreciable mass Extremities: No significant cyanosis, clubbing. Left great toe significantly swollen, erythematous, able to express serosanguineous fluid on palpation, toenail ready to detach, painful to touch.   Data Reviewed: Basic Metabolic Panel:  Recent Labs Lab 06/18/14 1720 06/19/14 0642  NA 136 142  K 4.5 4.1  CL 106 112  CO2 20 25  GLUCOSE 243* 127*  BUN 20 14  CREATININE 1.42* 1.16  CALCIUM 8.6 8.3*   Liver Function Tests:  Recent Labs Lab 06/18/14 1720 06/19/14 0642  AST 28 19  ALT 24 18  ALKPHOS 68 53  BILITOT 1.2 QUANTITY NOT SUFFICIENT, UNABLE TO PERFORM TEST  PROT 7.0 6.2  ALBUMIN 3.4* 2.8*   No results for input(s): LIPASE, AMYLASE in the last 168 hours. No results for input(s): AMMONIA in the last 168 hours. CBC:  Recent Labs Lab 06/18/14 1720 06/19/14 0642  WBC 19.7* 12.8*  NEUTROABS 17.7* 10.7*  HGB 11.4* 10.1*  HCT 34.0* 29.0*  MCV 95.5 93.2  PLT 198  PLATELETS APPEAR DECREASED   Cardiac Enzymes:  Recent Labs Lab 06/19/14 0222 06/19/14 0642  TROPONINI 0.05* 0.04*   BNP (last 3 results) No results for input(s): BNP in the last 8760 hours.  ProBNP (last 3 results) No results for input(s): PROBNP in the last 8760 hours.  CBG:  Recent Labs Lab 06/19/14 0040 06/19/14 0512 06/19/14 0816  GLUCAP 182* 120* 113*    Recent Results (from the past 240 hour(s))  MRSA PCR Screening     Status: None   Collection Time: 06/19/14 12:20 AM  Result Value Ref Range Status   MRSA by PCR NEGATIVE NEGATIVE Final    Comment:        The GeneXpert MRSA Assay (FDA approved for NASAL specimens only), is one component of a comprehensive MRSA colonization surveillance program. It is not intended to diagnose MRSA infection nor to guide or monitor treatment for MRSA infections.      Studies:  Recent x-ray studies have been reviewed in detail by the Attending Physician  Scheduled Meds:  Scheduled Meds: . atorvastatin  20 mg Oral Daily  . dextromethorphan-guaiFENesin  1 tablet Oral BID  . donepezil  10 mg Oral QHS  . feeding supplement (ENSURE COMPLETE)  237 mL Oral TID  . insulin aspart  0-9 Units Subcutaneous 6 times per day  . ipratropium-albuterol  3 mL Nebulization  Q6H  . latanoprost  1 drop Both Eyes QHS  . memantine  28 mg Oral Daily  . methylPREDNISolone (SOLU-MEDROL) injection  60 mg Intravenous Q24H  . piperacillin-tazobactam (ZOSYN)  IV  3.375 g Intravenous 3 times per day  . rivaroxaban  20 mg Oral Q supper  . tamsulosin  0.8 mg Oral QHS  . traZODone  50 mg Oral QHS  . vancomycin  750 mg Intravenous Q24H    Time spent on care of this patient: 40 mins   [place], Geraldo Docker , MD  [facility] Office  [phone] Pager - [phone]  On-Call/Text Page:      [email or web address]      password [email or web address]  If 7PM-7AM, please contact night-coverage [email or web address] Password [email or web address] 06/19/2014, 10:28 AM   LOS: 1 day   Care  during the described time interval was provided by me .  I have reviewed this patient's available data, including medical history, events of note, physical examination, radiology studies and test results as part of my evaluation  Dia Crawford, MD [phone] Pager

## 2014-06-19 NOTE — Evaluation (Signed)
Clinical/Bedside Swallow Evaluation Patient Details  Name: Craig Henderson MRN: [id number] Date of Birth: 1929/09/26  Today's Date: 06/19/2014 Time: SLP Start Time (ACUTE ONLY): 1115 SLP Stop Time (ACUTE ONLY): 1135 SLP Time Calculation (min) (ACUTE ONLY): 20 min  Past Medical History:  Past Medical History  Diagnosis Date  . Prostate cancer 07/18/11    bx=adenocarcinoma=PSA=12.45,gleason=4+5=9,4+3=7,4+4=8, 3+3=6 & 3+7,volume=156.35cc  . Diabetes mellitus   . Hypertension   . Hypercholesterolemia   . BPH (benign prostatic hyperplasia)     with nocturia,  . ED (erectile dysfunction)   . Hx of radiation therapy 10/23/11 - 12/18/11    prostate, seminal vesicles, lymph nodes  . Mild dementia    Past Surgical History:  Past Surgical History  Procedure Laterality Date  . Prostate biopsy  07/18/2011    gleason 4+5=9  . Rotator cuff repair  2011  . Fracture surgery  35 years    right leg/rod placement  . Cardioversion N/A 05/11/2013    Procedure: CARDIOVERSION;  Surgeon: Laverda Page, MD;  Location: [facility];  Service: Cardiovascular;  Laterality: N/A;   HPI:  [name] is a 81 y.o. male  has a past medical history of Prostate cancer (07/18/11); Diabetes mellitus; Hypertension; Hypercholesterolemia; BPH (benign prostatic hyperplasia); ED (erectile dysfunction); radiation therapy (10/23/11 - 12/18/11); and Mild dementia. He presented with 3 days of progressive fatigue and lethargy this morning as well as slurred speech. He has stopped eating but has been drinking water and Ensure. The patient has Ulcer on the left foot wife noticed it this week. The patient's wife reports subjective fever and some cough. On arrival to ER he was noted to have Left foot ulcer that seemed infected. CXR worrisome for PNA.The patient met sepsis criteria  He was given 3.5L of NS and started on Vanc and Zosyn. Currently vitals have stabilized. Patient is diabetic but has not been checking his glucose  at home. He has significant dementia but is currently able to follow commands.    Assessment / Plan / Recommendation Clinical Impression  Clinical evaluation of swallowing was completed.  The patient's family was present and reported no known issues swallowing.  The patient presented with oral/pharyngeal dysphagia characterized by delayed oral transit, delayed swallow trigger and decrease hyo-laryngeal excursion.  The patient presented with overt s/s of aspiration given thin and nectar liquids.  Given the patient's current clinical presentation and the suspected LM and LL PNA recommend a dysphagia 3 diet with honey thick liquids pending MBS on Monday.  Nursing and family are aware and in agreement.      Aspiration Risk  Moderate    Diet Recommendation Dysphagia 3 (Mechanical Soft);Honey-thick liquid   Liquid Administration via: Cup;Straw Medication Administration: Crushed with puree Supervision: Full supervision/cueing for compensatory strategies Compensations: Slow rate;Small sips/bites;Check for pocketing;Follow solids with liquid Postural Changes and/or Swallow Maneuvers: Seated upright 90 degrees    Other  Recommendations Recommended Consults: MBS Oral Care Recommendations: Oral care BID Other Recommendations: Order thickener from pharmacy;Remove water pitcher   Follow Up Recommendations   (to be determined)      Swallow Study Prior Functional Status       General Date of Onset: 06/18/14 HPI: Craig Henderson is a 81 y.o. male  has a past medical history of Prostate cancer (07/18/11); Diabetes mellitus; Hypertension; Hypercholesterolemia; BPH (benign prostatic hyperplasia); ED (erectile dysfunction); radiation therapy (10/23/11 - 12/18/11); and Mild dementia. He presented with 3 days of progressive fatigue and lethargy this morning as well  as slurred speech. He has stopped eating but has been drinking water and Ensure. The patient has Ulcer on the left foot wife noticed it this  week. The patient's wife reports subjective fever and some cough. On arrival to ER he was noted to have Left foot ulcer that seemed infected. CXR worrisome for PNA.The patient met sepsis criteria  He was given 3.5L of NS and started on Vanc and Zosyn. Currently vitals have stabilized. Patient is diabetic but has not been checking his glucose at home. He has significant dementia but is currently able to follow commands.  Type of Study: Bedside swallow evaluation Previous Swallow Assessment:  (none found on chart reivew) Diet Prior to this Study: Regular;Thin liquids Temperature Spikes Noted: Yes Respiratory Status: Room air History of Recent Intubation: No Behavior/Cognition: Alert;Cooperative;Pleasant mood;Confused Oral Cavity - Dentition: Poor condition;Missing dentition Self-Feeding Abilities: Total assist Patient Positioning: Upright in bed Baseline Vocal Quality: Clear Volitional Cough: Strong Volitional Swallow: Able to elicit    Oral/Motor/Sensory Function Overall Oral Motor/Sensory Function: Appears within functional limits for tasks assessed Labial ROM: Within Functional Limits Labial Symmetry: Within Functional Limits Labial Strength: Within Functional Limits Lingual ROM: Within Functional Limits Lingual Symmetry: Within Functional Limits Lingual Strength: Within Functional Limits Facial ROM: Within Functional Limits Facial Symmetry: Within Functional Limits Facial Strength: Within Functional Limits   Ice Chips Ice chips: Not tested   Thin Liquid Thin Liquid: Impaired Presentation: Cup;Spoon Oral Phase Impairments: Impaired anterior to posterior transit Oral Phase Functional Implications: Prolonged oral transit Pharyngeal  Phase Impairments: Suspected delayed Swallow;Decreased hyoid-laryngeal movement;Throat Clearing - Delayed;Cough - Delayed    Nectar Thick Nectar Thick Liquid: Impaired Presentation: Spoon Oral Phase Impairments: Impaired anterior to posterior  transit Oral phase functional implications: Prolonged oral transit Pharyngeal Phase Impairments: Suspected delayed Swallow;Decreased hyoid-laryngeal movement;Throat Clearing - Delayed   Honey Thick Honey Thick Liquid: Impaired Presentation: Spoon;Cup Oral Phase Impairments: Impaired anterior to posterior transit Oral Phase Functional Implications: Prolonged oral transit Pharyngeal Phase Impairments: Suspected delayed Swallow;Decreased hyoid-laryngeal movement   Puree Puree: Impaired Presentation: Spoon Oral Phase Impairments: Impaired anterior to posterior transit Oral Phase Functional Implications: Prolonged oral transit Pharyngeal Phase Impairments: Suspected delayed Swallow;Decreased hyoid-laryngeal movement   Solid   GO    Solid: Impaired Presentation: Spoon Oral Phase Impairments: Impaired anterior to posterior transit;Impaired mastication Oral Phase Functional Implications: Oral residue Pharyngeal Phase Impairments: Suspected delayed Swallow;Decreased hyoid-laryngeal movement       Lamar Sprinkles 06/19/2014,11:47 AM Shelly Flatten, MA, CCC-SLP Acute Rehab SLP [phone] '

## 2014-06-19 NOTE — Progress Notes (Signed)
  Echocardiogram 2D Echocardiogram has been performed.  Craig Henderson 06/19/2014, 2:46 PM

## 2014-06-19 NOTE — Progress Notes (Signed)
Nurse provided MD with paper copy of 0700 hour 12 lead EKG strip.   No new interventions ordered.  Nurse will continue to monitor

## 2014-06-19 NOTE — Progress Notes (Signed)
INITIAL NUTRITION ASSESSMENT Pt meets criteria for MODERATE MALNUTRITION in the context of ACUTE ILLNESS as evidenced by loss of 8% bw in 1 month and intake that met <75% of estimated needs for > 1 week. DOCUMENTATION CODES Per approved criteria  -Non-severe (moderate) malnutrition in the context of acute illness or injury   INTERVENTION: -Ensure Enlive po TID, each supplement provides 350 kcal and 20 grams of protein  -Recommend re-weighing of patient when able  -Patient  need re-education for DM/HH diet when appropriate  NUTRITION DIAGNOSIS: Inadequate oral intake related to poor appetite as evidenced by loss of at least 15 lbs this last month.   Goal:  Pt to meet >/= 90% of their estimated nutrition needs   Monitor:  Oral intake, tolerance, lipid panel/a1c results,   Reason for Assessment: MST of 3  81 y.o. male  Admitting Dx: <principal problem not specified>  ASSESSMENT:  81 y.o. male medical history of Prostate cancer, Diabetes mellitus; Hypertension; Hypercholesterolemia; BPH  and Mild dementia Presents with 3 days of progressive fatigue and lethargy. he also started to have slurred speech  Pt reports that he has had poor oral intake the past month  Because "food just didn't taste right". He also stated a poor appetite in general. He would drink 2 ensures a day to make up for his poor appeite. He ate all his breakfast this morning and says that is the first food he has eaten in 2 days. He was agreeable to starting the Ensures while in hospital.  Pt says his weight feels about the same though wt measurements show a significant loss.   Nutrition Focused Physical Exam:  Subcutaneous Fat:  Orbital Region: none Upper Arm Region: mild Thoracic and Lumbar Region: n/a  Muscle:  Temple Region: mild-moderate Clavicle Bone Region: none Clavicle and Acromion Bone Region: mild Scapular Bone Region: n/a Dorsal Hand: none Patellar Region: n/a Anterior Thigh Region:  n/a Posterior Calf Region: moderate  Edema: none   Height: Ht Readings from Last 1 Encounters:  06/19/14 5' 6"  (1.676 m)    Weight: Wt Readings from Last 1 Encounters:  06/19/14 139 lb 11.2 oz (63.368 kg)    Ideal Body Weight: 130 lbs  % Ideal Body Weight: 107%   Wt Readings from Last 10 Encounters:  06/19/14 139 lb 11.2 oz (63.368 kg)  05/13/13 168 lb (76.204 kg)  05/11/13 183 lb (83.008 kg)  11/10/12 183 lb (83.008 kg)  03/04/12 171 lb 4.8 oz (77.7 kg)  01/17/12 178 lb 11.2 oz (81.058 kg)  11/15/11 185 lb 9.6 oz (84.188 kg)  09/05/11 188 lb 9.6 oz (85.548 kg)  weight now on 3/11 was 168 lbs,   Usual Body Weight: Unsure  BMI:  Body mass index is 22.56 kg/(m^2).  Estimated Nutritional Needs: Kcal: 1600-1900 (25-30 kcal/kg) Protein: 95 g Pro (1.5 g/kg) Fluid: 1.9 liters  Skin: Dry , with diabetic foot ulcer stage 2  Diet Order: Diet Carb Modified  EDUCATION NEEDS: -Education needs addressed?  Uncontrolled DM   Intake/Output Summary (Last 24 hours) at 06/19/14 0828 Last data filed at 06/19/14 0600  Gross per 24 hour  Intake 3271.66 ml  Output    200 ml  Net 3071.66 ml    Last BM: 3/11  Labs:   Recent Labs Lab 06/18/14 1720 06/19/14 0642  NA 136 142  K 4.5 4.1  CL 106 112  CO2 20 25  BUN 20 14  CREATININE 1.42* 1.16  CALCIUM 8.6 8.3*  GLUCOSE 243* 127*  CBG (last 3)   Recent Labs  06/19/14 0040 06/19/14 0512 06/19/14 0816  GLUCAP 182* 120* 113*    Scheduled Meds: . atorvastatin  20 mg Oral Daily  . donepezil  10 mg Oral QHS  . feeding supplement (ENSURE COMPLETE)  237 mL Oral TID  . insulin aspart  0-9 Units Subcutaneous 6 times per day  . latanoprost  1 drop Both Eyes QHS  . memantine  28 mg Oral Daily  . piperacillin-tazobactam (ZOSYN)  IV  3.375 g Intravenous 3 times per day  . rivaroxaban  20 mg Oral Q supper  . tamsulosin  0.8 mg Oral QHS  . traZODone  50 mg Oral QHS  . vancomycin  750 mg Intravenous Q24H     Continuous Infusions: . sodium chloride 100 mL/hr at 06/19/14 0122    Past Medical History  Diagnosis Date  . Prostate cancer 07/18/11    bx=adenocarcinoma=PSA=12.45,gleason=4+5=9,4+3=7,4+4=8, 3+3=6 & 3+7,volume=156.35cc  . Diabetes mellitus   . Hypertension   . Hypercholesterolemia   . BPH (benign prostatic hyperplasia)     with nocturia,  . ED (erectile dysfunction)   . Hx of radiation therapy 10/23/11 - 12/18/11    prostate, seminal vesicles, lymph nodes  . Mild dementia     Past Surgical History  Procedure Laterality Date  . Prostate biopsy  07/18/2011    gleason 4+5=9  . Rotator cuff repair  2011  . Fracture surgery  35 years    right leg/rod placement  . Cardioversion N/A 05/11/2013    Procedure: CARDIOVERSION;  Surgeon: Laverda Page, MD;  Location: [facility];  Service: Cardiovascular;  Laterality: N/A;    Burtis Junes RD, LDN Nutrition Pager: [phone] 06/19/2014 8:31 AM

## 2014-06-19 NOTE — Progress Notes (Signed)
Pt not alert enough to do flutter and is not coughing at this time

## 2014-06-19 NOTE — Progress Notes (Signed)
Nurse informed MD that per echo tech 'pts heart was fluttering during procedure".  Nurse will continue to monitor.

## 2014-06-19 NOTE — Progress Notes (Signed)
PT Cancellation Note  Patient Details Name: MANNIX KROEKER MRN: [id number] DOB: Aug 21, 1929   Cancelled Treatment:    Reason Eval/Treat Not Completed: Medical issues which prohibited therapy Pt. With sustained resting HR in 130s to 140.  Did not initiate evaluation.  Will follow up at next available opportunity.     Ladona Ridgel 06/19/2014, 8:29 AM Gerlean Ren PT Acute Rehab Services [phone] [place] [phone]

## 2014-06-20 DIAGNOSIS — I319 Disease of pericardium, unspecified: Secondary | ICD-10-CM

## 2014-06-20 DIAGNOSIS — E440 Moderate protein-calorie malnutrition: Secondary | ICD-10-CM

## 2014-06-20 DIAGNOSIS — L089 Local infection of the skin and subcutaneous tissue, unspecified: Secondary | ICD-10-CM

## 2014-06-20 DIAGNOSIS — I313 Pericardial effusion (noninflammatory): Secondary | ICD-10-CM | POA: Diagnosis present

## 2014-06-20 DIAGNOSIS — I3139 Other pericardial effusion (noninflammatory): Secondary | ICD-10-CM | POA: Diagnosis present

## 2014-06-20 DIAGNOSIS — IMO0001 Reserved for inherently not codable concepts without codable children: Secondary | ICD-10-CM | POA: Diagnosis present

## 2014-06-20 DIAGNOSIS — I10 Essential (primary) hypertension: Secondary | ICD-10-CM

## 2014-06-20 LAB — COMPREHENSIVE METABOLIC PANEL
ALT: 22 U/L (ref 0–53)
ANION GAP: 7 (ref 5–15)
AST: 22 U/L (ref 0–37)
Albumin: 2.8 g/dL — ABNORMAL LOW (ref 3.5–5.2)
Alkaline Phosphatase: 70 U/L (ref 39–117)
BILIRUBIN TOTAL: 0.5 mg/dL (ref 0.3–1.2)
BUN: 20 mg/dL (ref 6–23)
CHLORIDE: 113 mmol/L — AB (ref 96–112)
CO2: 24 mmol/L (ref 19–32)
CREATININE: 1.43 mg/dL — AB (ref 0.50–1.35)
Calcium: 8.6 mg/dL (ref 8.4–10.5)
GFR calc Af Amer: 51 mL/min — ABNORMAL LOW (ref >90)
GFR, EST NON AFRICAN AMERICAN: 44 mL/min — AB (ref >90)
GLUCOSE: 236 mg/dL — AB (ref 70–99)
POTASSIUM: 4.1 mmol/L (ref 3.5–5.1)
Sodium: 144 mmol/L (ref 135–145)
TOTAL PROTEIN: 6.5 g/dL (ref 6.0–8.3)

## 2014-06-20 LAB — CBC WITH DIFFERENTIAL/PLATELET
Basophils Absolute: 0 10*3/uL (ref 0.0–0.1)
Basophils Relative: 0 % (ref 0–1)
Eosinophils Absolute: 0 10*3/uL (ref 0.0–0.7)
Eosinophils Relative: 0 % (ref 0–5)
HCT: 28.6 % — ABNORMAL LOW (ref 39.0–52.0)
Hemoglobin: 9.8 g/dL — ABNORMAL LOW (ref 13.0–17.0)
LYMPHS ABS: 0.8 10*3/uL (ref 0.7–4.0)
LYMPHS PCT: 6 % — AB (ref 12–46)
MCH: 31.9 pg (ref 26.0–34.0)
MCHC: 34.3 g/dL (ref 30.0–36.0)
MCV: 93.2 fL (ref 78.0–100.0)
Monocytes Absolute: 0.8 10*3/uL (ref 0.1–1.0)
Monocytes Relative: 6 % (ref 3–12)
Neutro Abs: 10.8 10*3/uL — ABNORMAL HIGH (ref 1.7–7.7)
Neutrophils Relative %: 88 % — ABNORMAL HIGH (ref 43–77)
Platelets: 171 10*3/uL (ref 150–400)
RBC: 3.07 MIL/uL — ABNORMAL LOW (ref 4.22–5.81)
RDW: 13.6 % (ref 11.5–15.5)
WBC: 12.3 10*3/uL — ABNORMAL HIGH (ref 4.0–10.5)

## 2014-06-20 LAB — MAGNESIUM: MAGNESIUM: 2 mg/dL (ref 1.5–2.5)

## 2014-06-20 LAB — LACTIC ACID, PLASMA: Lactic Acid, Venous: 3 mmol/L (ref 0.5–2.0)

## 2014-06-20 LAB — TROPONIN I: Troponin I: <0.03 ng/mL (ref <0.031)

## 2014-06-20 MED ORDER — RIVAROXABAN 15 MG PO TABS
15.0000 mg | ORAL_TABLET | Freq: Every day | ORAL | Status: DC
  Administered 2014-06-20 – 2014-06-23 (×4): 15 mg via ORAL
  Filled 2014-06-20 (×5): qty 1

## 2014-06-20 NOTE — Progress Notes (Signed)
Utilization Review Completed.   Briauna Gilmartin, RN, BSN Nurse Case Manager  

## 2014-06-20 NOTE — Discharge Instructions (Signed)
Can put a dry dressing or large band-aid over left great toe wound daily. Should soak the left foot in antibacterial soapy water once daily for 15-20 minutes; then dry it off real well.  Information on my medicine - XARELTO (Rivaroxaban)  This medication education was reviewed with me or my healthcare representative as part of my discharge preparation.  The pharmacist that spoke with me during my hospital stay was:  Melenie Minniear, Julaine Hua, [facility]  Why was Xarelto prescribed for you? Xarelto was prescribed for you to reduce the risk of a blood clot forming that can cause a stroke if you have a medical condition called atrial fibrillation (a type of irregular heartbeat).  What do you need to know about xarelto ? Take your Xarelto ONCE DAILY at the same time every day with your evening meal. If you have difficulty swallowing the tablet whole, you may crush it and mix in applesauce just prior to taking your dose.  Take Xarelto exactly as prescribed by your doctor and DO NOT stop taking Xarelto without talking to the doctor who prescribed the medication.  Stopping without other stroke prevention medication to take the place of Xarelto may increase your risk of developing a clot that causes a stroke.  Refill your prescription before you run out.  After discharge, you should have regular check-up appointments with your healthcare provider that is prescribing your Xarelto.  In the future your dose may need to be changed if your kidney function or weight changes by a significant amount.  What do you do if you miss a dose? If you are taking Xarelto ONCE DAILY and you miss a dose, take it as soon as you remember on the same day then continue your regularly scheduled once daily regimen the next day. Do not take two doses of Xarelto at the same time or on the same day.   Important Safety Information A possible side effect of Xarelto is bleeding. You should call your healthcare provider right away if you  experience any of the following: ? Bleeding from an injury or your nose that does not stop. ? Unusual colored urine (red or dark brown) or unusual colored stools (red or black). ? Unusual bruising for unknown reasons. ? A serious fall or if you hit your head (even if there is no bleeding).  Some medicines may interact with Xarelto and might increase your risk of bleeding while on Xarelto. To help avoid this, consult your healthcare provider or pharmacist prior to using any new prescription or non-prescription medications, including herbals, vitamins, non-steroidal anti-inflammatory drugs (NSAIDs) and supplements.  This website has more information on Xarelto: [email or web address]

## 2014-06-20 NOTE — Progress Notes (Signed)
Patient ID: Craig Henderson, male   DOB: [birth year], 81 y.o.   MRN: [id number] No acute changes with left foot great toe.  Blister was unroofed.  Looks ok otherwise.  New dry dressing applied.  His family says they have an appointment with a Podiatrist in the near future.  He does likely have significant PVD.  Will leave foot care instructions.

## 2014-06-20 NOTE — Progress Notes (Signed)
CRITICAL VALUE ALERT  Critical value received:  Lactic Acid 3.0  Date of notification:  06/20/14  Time of notification:  0650  Critical value read back:Yes.    Nurse who received alert:  Annice Pih  MD notified (1st page):  Walden Field, NP  Time of first page:  [phone]  MD notified (2nd page):  Time of second page:  Responding MD:  Walden Field, NP  Time MD responded:  [phone]

## 2014-06-20 NOTE — Progress Notes (Signed)
[facility] TEAM 1 - Stepdown/ICU TEAM Progress Note  LENON KUENNEN UVO:[id number] DOB: [birth year] DOA: 06/18/2014 PCP: Laverda Page, MD  Admit HPI / Brief Narrative: ELGIN CARN is a 81 y.o. male PMHx   has a past medical history of Prostate cancer (07/18/11); Diabetes mellitus; Hypertension; Hypercholesterolemia; BPH (benign prostatic hyperplasia); ED (erectile dysfunction); radiation therapy (10/23/11 - 12/18/11); and Mild dementia.   Presented with 3 days of progressive fatigue and lethargy this morning he also started to have slurred speech. He has stopped eating but have been drinking water and Ensure.  Patient has Ulcer on the left foot wife noticed it this week. Patient's wife reports subjective fever some cough. On arrival to ER he was noted to have Left foot ulcer that seemed infected. CXR worrisome for PNA. Patient was noted to be in A.fib with RVR with hr up to 160's. Patietn had been cardioverted in the past by [facility] He has hx of a.flutter on xarelto. Patietn met sepsis criteria with fever 102, HR 163, RR 26 and hypotension down to 95/60. He was given 3.5L of NS and started on Vanc and Zosyn. Currently vitals have stabilized. Initially CCM was consulted but given improvement Medical admition was recommended.  Patient is diabetic but has not been checking his glucose at home. HE has significant dementia. Family was interested in placement    Hospitalist was called for admission for Sepsis, due to diabetic foot ulcer and CAP   HPI/Subjective: 3/13 A/O 2, (does not know when, why). States he believes he is here secondary to residual illness from having the measles. States one month ago have the measles, however never sought care. States lives at home with his wife.   Assessment/Plan: Sepsis  - continue broad-spectrum antibiotics including vancomycin and Zosyn -Continue normal saline, 100 ml/hr -Lactic acid elevated_0 .0 -Strep pneumo urinary antigen  negative  UTI secondary to positive GNR  -Continue current antibiotic await speciation and sensitivities  Left great toe abscess  -Continue current antibiotics -Consulted orthopedic surgery; see note from 3/12  Diabetic foot ulcer  - patient has significant swelling of his left great toe. Plain imaging did not show any evidence of osteomyelitis.  -Per orthopedic surgery no abscess, large blister with loose nail; blister was resected.   CAP -Obtain sputum induced -Solu-Medrol 60 mg daily -Flutter valve -Mucinex DM  Hypertension  -Continue to Hold all BP medication  Atrial fibrillation/Atrial flutter -05/11/13 saw Dr. Adrian Prows (cardiology) for DCCV for atrial flutter - patient was initially in A. fib with RVR in the setting of sepsis. Responded to IV fluids.  -Echocardiogram; see results below  -Mildly elevated cardiac enzymes most likely demand ischemia. Cardiology consulted, enzymes have trended down.   Pericardial effusion (moderate) -Consulted cardiology; Dr. Adrian Prows [facility] cardiology  Diabetes mellitus type 2, uncontrolled -Hemoglobin A1c pending -Lipid panel; within ADA guidelines -Continue sensitive SSI  Dementia -Spoke with Wallis Bamberg (daughter) [phone] and her husband who stated that patient's mental status has been steadily declining since~2014. This has resulted in patient's driving privileges being revoked. They state that patient has episodes of confusion, not knowing where he is, not knowing why he is there. ,  -Worsening daughter feels that patient no longer able to care for himself at home and interested in having patient placed when ready discharge. -Consult to social work for help with placement discharge; will speak with regular social worker on Monday -Reconsulted chaplain for help in obtaining official HCPOA  Moderate malnutrition -Ensure complete TID between meals  Code Status: FULL Family Communication: no family present at time of  exam Disposition Plan: SNF vs assisted living    Consultants: -Dr.Christopher Y Blackman(Orthopedic surgery)     Procedure/Significant Events: 3/11 PCXR;Left midlung and left base opacities worrisome for pneumonia. 3/11 left foot x-ray;No definite acute osseous abnormalities 3/12 CT head without contrast;. 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  -No acute intracranial process. 3/12 echocardiogram;- LVEF= 60% to 65%.  - Pulmonary arteries: PA peak pressure: 21 mm Hg (S). - Pericardium, extracardiac: moderate pericardial effusion; no obvious tamponade physiology based on respiratory variation in mitral inflow.    Culture 3/11 blood right/left arm NGTD 3/11 influenza A/B/H1N1 negative 3/ 11 urine positive GNR  3/12 strep pneumo urine antigen negative 3/12 Legionella urine antigens pending  Antibiotics: Zosyn 3/12>> Vancomycin 3/11>>   DVT prophylaxis: Xarelto   Devices    LINES / TUBES:      Continuous Infusions: . sodium chloride 100 mL/hr at 06/20/14 0811    Objective: VITAL SIGNS: Temp: 97.9 F (36.6 C) (03/13 0821) Temp Source: Oral (03/13 0821) BP: 127/67 mmHg (03/13 0800) Pulse Rate: 74 (03/13 0800) SPO2; FIO2:   Intake/Output Summary (Last 24 hours) at 06/20/14 1103 Last data filed at 06/20/14 0650  Gross per 24 hour  Intake   2200 ml  Output   1350 ml  Net    850 ml     Exam: General:A/O 2, (does not know when, why), continues to believe he has residual illness from measles, No acute respiratory distress Lungs: clear to auscultation bilateral, negative expiratory wheezing  Cardiovascular: Irregular irregular rhythm and rate, without murmur gallop or rub normal S1 and S2 Abdomen: Nontender, nondistended, soft, bowel sounds positive, no rebound, no ascites, no appreciable mass Extremities: No significant cyanosis, clubbing. Left great toe significantly swollen, erythematous, covering clean, did not take off  the dressing    Data Reviewed: Basic Metabolic Panel:  Recent Labs Lab 06/18/14 1720 06/19/14 0642 06/20/14 0415  NA 136 142 144  K 4.5 4.1 4.1  CL 106 112 113*  CO2 _0 GLUCOSE 243* 127* 236*  BUN _1 CREATININE 1.42* 1.16 1.43*  CALCIUM 8.6 8.3* 8.6  MG  --   --  2.0   Liver Function Tests:  Recent Labs Lab 06/18/14 1720 06/19/14 0642 06/20/14 0415  AST _2 ALT _3 ALKPHOS 68 53 70  BILITOT 1.2 QUANTITY NOT SUFFICIENT, UNABLE TO PERFORM TEST 0.5  PROT 7.0 6.2 6.5  ALBUMIN 3.4* 2.8* 2.8*   No results for input(s): LIPASE, AMYLASE in the last 168 hours. No results for input(s): AMMONIA in the last 168 hours. CBC:  Recent Labs Lab 06/18/14 1720 06/19/14 0642 06/20/14 0415  WBC 19.7* 12.8* 12.3*  NEUTROABS 17.7* 10.7* 10.8*  HGB 11.4* 10.1* 9.8*  HCT 34.0* 29.0* 28.6*  MCV 95.5 93.2 93.2  PLT 198 PLATELETS APPEAR DECREASED 171   Cardiac Enzymes:  Recent Labs Lab 06/19/14 0222 06/19/14 0642 06/19/14 1005 06/19/14 1600 06/19/14 2300  TROPONINI 0.05* 0.04* 0.03 0.03 <0.03   BNP (last 3 results) No results for input(s): BNP in the last 8760 hours.  ProBNP (last 3 results) No results for input(s): PROBNP in the last 8760 hours.  CBG:  Recent Labs Lab 06/19/14 0040 06/19/14 0512 06/19/14 0816  GLUCAP 182* 120* 113*    Recent Results (from the past 240 hour(s))  Culture, blood (routine x 2)  Status: None (Preliminary result)   Collection Time: 06/18/14  4:20 PM  Result Value Ref Range Status   Specimen Description BLOOD RIGHT ARM  5 ML IN [facility] BOTTLE  Final   Special Requests NONE  Final   Culture   Final           BLOOD CULTURE RECEIVED NO GROWTH TO DATE CULTURE WILL BE HELD FOR 5 DAYS BEFORE ISSUING A FINAL NEGATIVE REPORT Performed at [facility]    Report Status PENDING  Incomplete  Culture, blood (routine x 2)     Status: None (Preliminary result)   Collection Time: 06/18/14  5:52 PM  Result  Value Ref Range Status   Specimen Description BLOOD LEFT ARM  5 ML IN [facility] BOTTLE  Final   Special Requests NONE  Final   Culture   Final           BLOOD CULTURE RECEIVED NO GROWTH TO DATE CULTURE WILL BE HELD FOR 5 DAYS BEFORE ISSUING A FINAL NEGATIVE REPORT Performed at [facility]    Report Status PENDING  Incomplete  Urine culture     Status: None (Preliminary result)   Collection Time: 06/18/14  6:06 PM  Result Value Ref Range Status   Specimen Description URINE, CATHETERIZED  Final   Special Requests NONE  Final   Colony Count   Final    >=100,000 COLONIES/ML Performed at [facility]    Culture   Final    Fl[place] Performed at [facility]    Report Status PENDING  Incomplete  MRSA PCR Screening     Status: None   Collection Time: 06/19/14 12:20 AM  Result Value Ref Range Status   MRSA by PCR NEGATIVE NEGATIVE Final    Comment:        The GeneXpert MRSA Assay (FDA approved for NASAL specimens only), is one component of a comprehensive MRSA colonization surveillance program. It is not intended to diagnose MRSA infection nor to guide or monitor treatment for MRSA infections.      Studies:  Recent x-ray studies have been reviewed in detail by the Attending Physician  Scheduled Meds:  Scheduled Meds: . atorvastatin  20 mg Oral Daily  . dextromethorphan-guaiFENesin  1 tablet Oral BID  . donepezil  10 mg Oral QHS  . feeding supplement (ENSURE COMPLETE)  237 mL Oral TID  . insulin aspart  0-9 Units Subcutaneous 6 times per day  . latanoprost  1 drop Both Eyes QHS  . memantine  28 mg Oral Daily  . methylPREDNISolone (SOLU-MEDROL) injection  60 mg Intravenous Q24H  . piperacillin-tazobactam (ZOSYN)  IV  3.375 g Intravenous 3 times per day  . rivaroxaban  20 mg Oral Q supper  . tamsulosin  0.8 mg Oral QHS  . traZODone  50 mg Oral QHS  . vancomycin  750 mg Intravenous Q24H    Time spent on care of this patient: 40  mins   Neyda Durango, Geraldo Docker , MD  [facility] Office  [phone] Pager - [phone]  On-Call/Text Page:      [email or web address]      password [email or web address]  If 7PM-7AM, please contact night-coverage [email or web address] Password [email or web address] 06/20/2014, 11:03 AM   LOS: 2 days   Care during the described time interval was provided by me .  I have reviewed this patient's available data, including medical history, events of note, physical examination, radiology studies and test results as part of my evaluation  Dia Crawford,  MD [phone] Pager

## 2014-06-21 ENCOUNTER — Inpatient Hospital Stay (HOSPITAL_COMMUNITY): Payer: Medicare Other

## 2014-06-21 LAB — LEGIONELLA ANTIGEN, URINE

## 2014-06-21 LAB — COMPREHENSIVE METABOLIC PANEL
ALT: 39 U/L (ref 0–53)
AST: 32 U/L (ref 0–37)
Albumin: 2.8 g/dL — ABNORMAL LOW (ref 3.5–5.2)
Alkaline Phosphatase: 70 U/L (ref 39–117)
Anion gap: 7 (ref 5–15)
BUN: 17 mg/dL (ref 6–23)
CO2: 23 mmol/L (ref 19–32)
CREATININE: 1.14 mg/dL (ref 0.50–1.35)
Calcium: 8.7 mg/dL (ref 8.4–10.5)
Chloride: 117 mmol/L — ABNORMAL HIGH (ref 96–112)
GFR calc Af Amer: 67 mL/min — ABNORMAL LOW (ref >90)
GFR, EST NON AFRICAN AMERICAN: 58 mL/min — AB (ref >90)
Glucose, Bld: 127 mg/dL — ABNORMAL HIGH (ref 70–99)
POTASSIUM: 4.1 mmol/L (ref 3.5–5.1)
SODIUM: 147 mmol/L — AB (ref 135–145)
Total Bilirubin: 0.6 mg/dL (ref 0.3–1.2)
Total Protein: 6.4 g/dL (ref 6.0–8.3)

## 2014-06-21 LAB — GLUCOSE, CAPILLARY
GLUCOSE-CAPILLARY: 114 mg/dL — AB (ref 70–99)
GLUCOSE-CAPILLARY: 288 mg/dL — AB (ref 70–99)
GLUCOSE-CAPILLARY: 84 mg/dL (ref 70–99)
Glucose-Capillary: 256 mg/dL — ABNORMAL HIGH (ref 70–99)
Glucose-Capillary: 165 mg/dL — ABNORMAL HIGH (ref 70–99)
Glucose-Capillary: 209 mg/dL — ABNORMAL HIGH (ref 70–99)
Glucose-Capillary: 254 mg/dL — ABNORMAL HIGH (ref 70–99)
Glucose-Capillary: 223 mg/dL — ABNORMAL HIGH (ref 70–99)
Glucose-Capillary: 160 mg/dL — ABNORMAL HIGH (ref 70–99)
Glucose-Capillary: 192 mg/dL — ABNORMAL HIGH (ref 70–99)
Glucose-Capillary: 302 mg/dL — ABNORMAL HIGH (ref 70–99)
Glucose-Capillary: 276 mg/dL — ABNORMAL HIGH (ref 70–99)

## 2014-06-21 LAB — URINE CULTURE: Colony Count: >=100000

## 2014-06-21 LAB — CBC WITH DIFFERENTIAL/PLATELET
BASOS ABS: 0 10*3/uL (ref 0.0–0.1)
Basophils Relative: 0 % (ref 0–1)
Eosinophils Absolute: 0 10*3/uL (ref 0.0–0.7)
Eosinophils Relative: 0 % (ref 0–5)
HEMATOCRIT: 28 % — AB (ref 39.0–52.0)
HEMOGLOBIN: 9.3 g/dL — AB (ref 13.0–17.0)
Lymphocytes Relative: 9 % — ABNORMAL LOW (ref 12–46)
Lymphs Abs: 1.3 10*3/uL (ref 0.7–4.0)
MCH: 31.4 pg (ref 26.0–34.0)
MCHC: 33.2 g/dL (ref 30.0–36.0)
MCV: 94.6 fL (ref 78.0–100.0)
Monocytes Absolute: 0.9 10*3/uL (ref 0.1–1.0)
Monocytes Relative: 6 % (ref 3–12)
Neutro Abs: 12.9 10*3/uL — ABNORMAL HIGH (ref 1.7–7.7)
Neutrophils Relative %: 85 % — ABNORMAL HIGH (ref 43–77)
PLATELETS: 185 10*3/uL (ref 150–400)
RBC: 2.96 MIL/uL — ABNORMAL LOW (ref 4.22–5.81)
RDW: 13.7 % (ref 11.5–15.5)
WBC: 15.1 10*3/uL — ABNORMAL HIGH (ref 4.0–10.5)

## 2014-06-21 LAB — MAGNESIUM
Magnesium: 1.9 mg/dL (ref 1.5–2.5)
Magnesium: 1.9 mg/dL (ref 1.5–2.5)

## 2014-06-21 LAB — HEMOGLOBIN A1C
Hgb A1c MFr Bld: 5.1 % (ref 4.8–5.6)
Mean Plasma Glucose: 100 mg/dL

## 2014-06-21 LAB — CLOSTRIDIUM DIFFICILE BY PCR: CDIFFPCR: NEGATIVE

## 2014-06-21 LAB — PROCALCITONIN: Procalcitonin: 2.54 ng/mL

## 2014-06-21 MED ORDER — INSULIN ASPART 100 UNIT/ML ~~LOC~~ SOLN
0.0000 [IU] | Freq: Three times a day (TID) | SUBCUTANEOUS | Status: DC
  Administered 2014-06-21: 7 [IU] via SUBCUTANEOUS
  Administered 2014-06-21 – 2014-06-23 (×6): 2 [IU] via SUBCUTANEOUS
  Administered 2014-06-23: 3 [IU] via SUBCUTANEOUS
  Administered 2014-06-24 (×2): 2 [IU] via SUBCUTANEOUS

## 2014-06-21 MED ORDER — LOPERAMIDE HCL 2 MG PO CAPS
2.0000 mg | ORAL_CAPSULE | ORAL | Status: DC | PRN
  Administered 2014-06-21 – 2014-06-23 (×2): 2 mg via ORAL
  Filled 2014-06-21 (×2): qty 1

## 2014-06-21 MED ORDER — OXYCODONE HCL 5 MG/5ML PO SOLN: 5.0000 mg | Freq: Once | ORAL | Status: DC

## 2014-06-21 MED ORDER — CEFTRIAXONE SODIUM IN DEXTROSE 20 MG/ML IV SOLN
1.0000 g | INTRAVENOUS | Status: DC
  Administered 2014-06-21: 1 g via INTRAVENOUS
  Filled 2014-06-21 (×2): qty 50

## 2014-06-21 MED ORDER — SODIUM CHLORIDE 0.9 % IV SOLN
Freq: Once | INTRAVENOUS | Status: AC
  Administered 2014-06-21: 07:00:00 via INTRAVENOUS

## 2014-06-21 MED ORDER — METOPROLOL TARTRATE 25 MG PO TABS
25.0000 mg | ORAL_TABLET | Freq: Two times a day (BID) | ORAL | Status: DC
  Administered 2014-06-21 (×2): 25 mg via ORAL
  Filled 2014-06-21 (×4): qty 1

## 2014-06-21 MED ORDER — OXYCODONE HCL 5 MG PO TABS: 5.0000 mg | ORAL_TABLET | Freq: Once | ORAL | Status: DC

## 2014-06-21 MED ORDER — SODIUM CHLORIDE 0.9 % IV BOLUS (SEPSIS): 500.0000 mL | Freq: Once | INTRAVENOUS | Status: DC

## 2014-06-21 MED ORDER — METOPROLOL TARTRATE 1 MG/ML IV SOLN
5.0000 mg | Freq: Once | INTRAVENOUS | Status: AC
  Administered 2014-06-21: 5 mg via INTRAVENOUS
  Filled 2014-06-21: qty 5

## 2014-06-21 NOTE — Evaluation (Signed)
Physical Therapy Evaluation Patient Details Name: Craig Henderson MRN: [id number] DOB: Nov 14, 1929 Today's Date: 06/21/2014   History of Present Illness  81 y.o male admitted  with 3 days of progressive fatigue and lethargy and slurred speech.  He was noted to have infected Lt foot ulcer,  CXR worrisome for PNA.  He was also in AFib with RVR with HR up to 160s.  + for  UTI diagnosed with sepsis.   PMH:  includes DM; dementia; HTN  Clinical Impression  Patient demonstrates deficits in functional mobility as indicated below. Will need continued skilled PT to address deficits and maximize function. Will see as indicated and progress as tolerated. Will need ST SNF upon acute discharge. OF NOTE: Elevated HR Sinus Tach with mobility 154 at highest.     Follow Up Recommendations SNF;Supervision/Assistance - 24 hour    Equipment Recommendations  None recommended by PT    Recommendations for Other Services       Precautions / Restrictions Precautions Precautions: Fall Restrictions Weight Bearing Restrictions: No      Mobility  Bed Mobility Overal bed mobility: Needs Assistance Bed Mobility: Rolling;Sidelying to Sit;Sit to Supine Rolling: Mod assist Sidelying to sit: Max assist   Sit to supine: Max assist   General bed mobility comments: Patient with delayed processing, able to initiate some movements to EOB but could not sequence or carry out task  Transfers Overall transfer level: Needs assistance Equipment used: Rolling walker (2 wheeled) Transfers: Sit to/from Stand Sit to Stand: Min assist         General transfer comment: Min assist for stability in coming to standing  Ambulation/Gait Ambulation/Gait assistance: Min assist Ambulation Distance (Feet): 6 Feet (fwd and backwards ) Assistive device: Rolling walker (2 wheeled) Gait Pattern/deviations: Step-to pattern;Trunk flexed;Narrow base of support   Gait velocity interpretation: <1.8 ft/sec, indicative of risk for  recurrent falls    Stairs            Wheelchair Mobility    Modified Rankin (Stroke Patients Only)       Balance Overall balance assessment: Needs assistance Sitting-balance support: Feet supported Sitting balance-Leahy Scale: Good     Standing balance support: Bilateral upper extremity supported Standing balance-Leahy Scale: Poor Standing balance comment: requires bil. UE support                              Pertinent Vitals/Pain      Home Living Family/patient expects to be discharged to:: Skilled nursing facility                 Additional Comments: Per chart family is unable to continue providing care for pt and wish to pursue SNF vs. ALF     Prior Function           Comments: No family present to confirm.  Pt unable to provide info.     Hand Dominance   Dominant Hand: Right    Extremity/Trunk Assessment   Upper Extremity Assessment: Overall WFL for tasks assessed           Lower Extremity Assessment: Defer to PT evaluation         Communication   Communication: No difficulties  Cognition Arousal/Alertness: Awake/alert Behavior During Therapy: WFL for tasks assessed/performed Overall Cognitive Status: No family/caregiver present to determine baseline cognitive functioning (Pt with h/o dementia )  General Comments      Exercises        Assessment/Plan    PT Assessment Patient needs continued PT services  PT Diagnosis Difficulty walking;Abnormality of gait;Altered mental status   PT Problem List Decreased strength;Decreased activity tolerance;Decreased balance;Decreased mobility;Decreased cognition;Decreased safety awareness  PT Treatment Interventions DME instruction;Gait training;Functional mobility training;Therapeutic activities;Therapeutic exercise;Balance training;Patient/family education   PT Goals (Current goals can be found in the Care Plan section) Acute Rehab PT Goals PT  Goal Formulation: Patient unable to participate in goal setting Time For Goal Achievement: 07/05/14 Potential to Achieve Goals: Fair    Frequency Min 2X/week   Barriers to discharge        Co-evaluation PT/OT/SLP Co-Evaluation/Treatment: Yes Reason for Co-Treatment: For patient/therapist safety (due to cognitive deficits and incontinence ) PT goals addressed during session: Mobility/safety with mobility OT goals addressed during session: ADL's and self-care       End of Session   Activity Tolerance: Patient limited by fatigue;Other (comment) (patient with +BM in standing, hygiene and pericare performed) Patient left: in bed;with call bell/phone within reach;with bed alarm set Nurse Communication: Mobility status         Time: 7289-7915 PT Time Calculation (min) (ACUTE ONLY): 20 min   Charges:   PT Evaluation $Initial PT Evaluation Tier I: 1 Procedure     PT G CodesDuncan Henderson 07-15-14, 12:24 PM Craig Henderson, [place] DPT  [phone]

## 2014-06-21 NOTE — Progress Notes (Signed)
Pt transferred per bed with all belongings to [address] [place] bed 27. Oriented to room call light given to pt

## 2014-06-21 NOTE — Progress Notes (Signed)
Transfer report received from [facility] at 1442 and pt arrived to the unit via bed with belongings to the side at 1520. Pt alert and verbally responsive; Pt oriented to the room and unit; VSS; telemetry applied and verified; pt foley intact and unclamped; dsg to Left foot toe clean, dry and intact; pt in bed comfortably with call light within reach. IV kvo and infusing; will continue to monitor quietly. Francis Gaines Chiamaka Latka RN.

## 2014-06-21 NOTE — Consult Note (Signed)
CARDIOLOGY CONSULT NOTE  Patient ID: Craig Henderson MRN: [id number] DOB/AGE: 1929/05/03 81 y.o.  Admit date: 06/18/2014 Referring Physician:  [facility] Primary Physician:  Laverda Page, MD Reason for Consultation:  A. Flutter, pericardial effusion  HPI: Craig Henderson is an 81 year-old African-American male with hypertension, hyperlipidemia, diabetes, and mild dementia, and history of atrial flutter and first degree AV block.  He was diagnosed with atrial flutter after feeling palpitations on 01/25/2013, started on Xarelto 01/29/2013 and underwent cardioversion on 05/11/2013.  He had maintained sinus rhythm since cardioversion.    He presented to the emergency department on 06/18/2014 after 3 days of progressive fatigue and reports of slurred speech by his family.  He was noted to have ulcer on the left foot and pneumonia per chest x-ray.  He was in A. Flutter with RVR and rates in the 160s on admission.  Rate improved after multiple normal saline fluid boluses.  Patient is presently on vancomycin and Zosyn antibiotic therapy.  Echocardiogram performed due to sepsis and rapid A. fib revealing moderate pericardial effusion.  Troponin initially 0.05 on admission which has trended down.    Patient alert during exam.  Denies any chest pain or shortness of breath.  He denies any PND, orthopnea, edema, palpitations, dizziness, or symptoms suggestive of TIA.  No family present at the bedside this morning.  Denies pain in his legs, denies symptoms of claudication. Started with left toe ulceration.  Past Medical History  Diagnosis Date  . Prostate cancer 07/18/11    bx=adenocarcinoma=PSA=12.45,gleason=4+5=9,4+3=7,4+4=8, 3+3=6 & 3+7,volume=156.35cc  . Diabetes mellitus   . Hypertension   . Hypercholesterolemia   . BPH (benign prostatic hyperplasia)     with nocturia,  . ED (erectile dysfunction)   . Hx of radiation therapy 10/23/11 - 12/18/11    prostate, seminal vesicles, lymph nodes   . Mild dementia      Past Surgical History  Procedure Laterality Date  . Prostate biopsy  07/18/2011    gleason 4+5=9  . Rotator cuff repair  2011  . Fracture surgery  35 years    right leg/rod placement  . Cardioversion N/A 05/11/2013    Procedure: CARDIOVERSION;  Surgeon: Laverda Page, MD;  Location: [facility] ENDOSCOPY;  Service: Cardiovascular;  Laterality: N/A;     Family History  Problem Relation Age of Onset  . Pneumonia Mother   . Cancer Father      Social History: History   Social History  . Marital Status: Married    Spouse Name: Florine  . Number of Children: 5  . Years of Education: College   Occupational History  . [employer]     retired   Social History Main Topics  . Smoking status: Former Smoker -- 1.00 packs/day for 10 years    Types: Cigarettes    Quit date: 09/04/1972  . Smokeless tobacco: Never Used     Comment: quit 20 years ago  . Alcohol Use: 0.0 oz/week     Comment: not anymore  . Drug Use: No  . Sexual Activity: Not on file   Other Topics Concern  . Not on file   Social History Narrative   Patient lives at home with spouse.   Caffeine Use: 1 soda daily     Prescriptions prior to admission  Medication Sig Dispense Refill Last Dose  . atorvastatin (LIPITOR) 20 MG tablet Take 20 mg by mouth daily.   06/17/2014 at Unknown time  . donepezil (ARICEPT) 10 MG tablet Take 1 tablet (  10 mg total) by mouth at bedtime. 90 tablet 4 06/17/2014 at Unknown time  . ENSURE (ENSURE) Take 237 mLs by mouth 3 (three) times daily.    06/18/2014 at Unknown time  . finasteride (PROSCAR) 5 MG tablet Take 1 tablet by mouth daily.   06/17/2014  . Iron-FA-B Cmp-C-Biot-Probiotic (FUSION PLUS) CAPS Take 1 capsule by mouth daily.   06/17/2014 at Unknown time  . NAMENDA XR 28 MG CP24 24 hr capsule TAKE ONE CAPSULE BY MOUTH EVERY DAY 30 capsule 0 06/17/2014 at Unknown time  . rivaroxaban (XARELTO) 20 MG TABS tablet Take 20 mg by mouth daily with supper.   06/17/2014 at Unknown  time  . sitaGLIPtin (JANUVIA) 100 MG tablet Take 100 mg by mouth daily.   06/17/2014 at Unknown time  . tamsulosin (FLOMAX) 0.4 MG CAPS capsule Take 0.8 mg by mouth at bedtime.  0 06/17/2014  . Travoprost, BAK Free, (TRAVATAN) 0.004 % SOLN ophthalmic solution Place 1 drop into both eyes at bedtime.   unknown  . traZODone (DESYREL) 50 MG tablet Take 50 mg by mouth at bedtime.   06/17/2014 at Unknown time  . cephALEXin (KEFLEX) 500 MG capsule Take 1 capsule (500 mg total) by mouth 4 (four) times daily. (Patient not taking: Reported on 06/18/2014) 28 capsule 0 Not Taking at Unknown time    Scheduled Meds: . atorvastatin  20 mg Oral Daily  . dextromethorphan-guaiFENesin  1 tablet Oral BID  . donepezil  10 mg Oral QHS  . feeding supplement (ENSURE COMPLETE)  237 mL Oral TID  . insulin aspart  0-9 Units Subcutaneous 6 times per day  . latanoprost  1 drop Both Eyes QHS  . memantine  28 mg Oral Daily  . methylPREDNISolone (SOLU-MEDROL) injection  60 mg Intravenous Q24H  . oxyCODONE  5 mg Oral Once  . piperacillin-tazobactam (ZOSYN)  IV  3.375 g Intravenous 3 times per day  . rivaroxaban  15 mg Oral Q supper  . sodium chloride  500 mL Intravenous Once  . tamsulosin  0.8 mg Oral QHS  . traZODone  50 mg Oral QHS  . vancomycin  750 mg Intravenous Q24H   Continuous Infusions: . sodium chloride 100 mL/hr at 06/21/14 0419   PRN Meds:.ipratropium-albuterol, RESOURCE THICKENUP CLEAR  ROS: General: no fevers/chills/night sweats Eyes: no blurry vision, diplopia, or amaurosis ENT: no sore throat or hearing loss Resp: no cough, wheezing, or hemoptysis CV: no edema or palpitations GI: no abdominal pain, nausea, vomiting, diarrhea, or constipation GU: no dysuria, frequency, or hematuria Skin: no rash Neuro: no headache, numbness, tingling, or weakness of extremities Musculoskeletal: no joint pain or swelling Heme: no bleeding, DVT, or easy bruising Endo: no polydipsia or polyuria    Physical  Exam: Blood pressure 158/83, pulse 73, temperature 98.2 F (36.8 C), temperature source Oral, resp. rate 17, height 5\' 6"  (1.676 m), weight 66.815 kg (147 lb 4.8 oz), SpO2 100 %.   General appearance: alert, cooperative, appears stated age and no distress Neck: no carotid bruit, no JVD, supple, symmetrical, trachea midline and thyroid not enlarged, symmetric, no tenderness/mass/nodules Lungs: clear to auscultation bilaterally Chest wall: no tenderness Heart: regular rate and rhythm, S1, S2 normal, no murmur, click, rub or gallop and distant heart sounds Extremities: no edema, redness or tenderness in the calves or thighs and left great toe bandaged d/t ulceration Pulses: Femoral pulses 2+, popliteal pulse 1-2+ bilateral, pedal pulse absent dorsalis pedis and 1-2+ PT  left. On the right leg, DP faint,  PT unable to be felt.  Labs:   Lab Results  Component Value Date   WBC 15.1* 06/21/2014   HGB 9.3* 06/21/2014   HCT 28.0* 06/21/2014   MCV 94.6 06/21/2014   PLT 185 06/21/2014    Recent Labs Lab 06/21/14 0525  NA 147*  K 4.1  CL 117*  CO2 23  BUN 17  CREATININE 1.14  CALCIUM 8.7  PROT 6.4  BILITOT 0.6  ALKPHOS 70  ALT 39  AST 32  GLUCOSE 127*   Lab Results  Component Value Date   CKTOTAL 278* 03/04/2012   CKMB 3.4 03/04/2012   TROPONINI <0.03 06/19/2014    Lipid Panel     Component Value Date/Time   CHOL 130 06/19/2014 1005   TRIG 52 06/19/2014 1005   HDL 50 06/19/2014 1005   CHOLHDL 2.6 06/19/2014 1005   VLDL 10 06/19/2014 1005   LDLCALC 70 06/19/2014 1005   EKG atrial flutter with variable ventricular response. No evidence of ischemia. Telemetry 06/21/2014: Atrial flutter with variable ventricular response. Well controlled and 8. Echo: Normal LV wall thickness with LVEF 60-65%. Indeterminate diastolic function in the setting of atrial flutter. Sclerotic aortic valve. Trivial mitral and tricuspid regurgitation. Normal PASP 21 mmHg. A moderate pericardial effusion  was identified circumferential to the heart, but most prominent anterior to the right ventricle. Mild undulation of the right atrial free wall noted, but no obvious tamponade physiology based on respiratory variation in mitral inflow.  ASSESSMENT AND PLAN:  1. A. Flutter with variable AV conduction : CHA2DS2-VASc Score is 4 with yearly risk of stroke of 4 %.  Bleeding risk is 1 %/year. Rec: [place] 2. Moderate pericardial effusion 3. Hypertension 4. Diabetes mellitus type 2 controlled 5. Hyperlipidemia 6. Left greater toe ulceration, blister with loose nail. No suspicion for delayed wound healing. 7. Diabetes mellitus with peripheral arterial disease. 8. Dementia   Rachel Bo, NP-C 06/21/2014, 7:44 AM [facility] Pager: [phone] Office: [phone]  Recommendation: I reviewed the echocardiogram images, he does have moderate pericardial effusion, appears to be transudative and probably related to infectious process and postinflammatory transudative effusion related to lung infection. There is no evidence of any thrombus in the pericardial effusion, there is no cavity collapse or evidence of tamponade. He is no JVD, no clinical evidence of congestive heart failure, blood pressure is well maintained. Hence at this point conservative therapy with watchful waiting is indicated. We can consider repeating echocardiogram in 6-8 weeks for follow-up of pericardial effusion.  With regard to atrial flutter, he is presently on appropriate medical therapy, as long as he remains asymptomatic we will continue medical therapy with rate control only. The atrial flutter appears to be atypical hence success with atrial flutter ablation is less likely to give long group result and probably may not affect long-term need for anticoagulation. Please call me if have any questions.  I have personally reviewed the patient's record and performed physical exam and agree with the assessment  and plan of Ms. Neldon Labella, NP-C.  Laverda Page, MD [facility] Pager: [phone].   Office: [phone] If no answer: Mobile: [phone]

## 2014-06-21 NOTE — Evaluation (Signed)
Occupational Therapy Evaluation Patient Details Name: Craig Henderson MRN: [id number] DOB: 1929-08-14 Today's Date: 06/21/2014    History of Present Illness 81 y.o male admitted  with 3 days of progressive fatigue and lethargy and slurred speech.  He was noted to have infected Lt foot ulcer,  CXR worrisome for PNA.  He was also in AFib with RVR with HR up to 160s.  + for  UTI diagnosed with sepsis.   PMH:  includes DM; dementia; HTN   Clinical Impression   Pt admitted with above. He demonstrates the below listed deficits and will benefit from continued OT to maximize safety and independence with BADLs.  Pt presents to OT with h/o cognitive deficits, decreased balance, and generalized weakness.   He requires mod A for BADLs. And min A for functional mobility.  HR to 154 during eval.  Recommend SNF level rehab at discharge, with likely transition to ALF       Follow Up Recommendations  SNF    Equipment Recommendations  None recommended by OT    Recommendations for Other Services       Precautions / Restrictions Precautions Precautions: Fall Restrictions Weight Bearing Restrictions: No      Mobility Bed Mobility Overal bed mobility: Needs Assistance Bed Mobility: Rolling;Sidelying to Sit;Sit to Supine Rolling: Mod assist Sidelying to sit: Max assist   Sit to supine: Max assist   General bed mobility comments: Patient with delayed processing, able to initiate some movements to EOB but could not sequence or carry out task  Transfers Overall transfer level: Needs assistance Equipment used: Rolling walker (2 wheeled) Transfers: Sit to/from Stand Sit to Stand: Min assist         General transfer comment: Min assist for stability in coming to standing    Balance Overall balance assessment: Needs assistance Sitting-balance support: Feet supported Sitting balance-Leahy Scale: Good     Standing balance support: Bilateral upper extremity supported Standing  balance-Leahy Scale: Poor Standing balance comment: requires bil. UE support                             ADL Overall ADL's : Needs assistance/impaired Eating/Feeding: Supervision/ safety;Set up;Sitting   Grooming: Wash/dry hands;Wash/dry face;Oral care;Brushing hair;Minimal assistance;Sitting Grooming Details (indicate cue type and reason): Requires assist due to attentional deficits  Upper Body Bathing: Sitting;Moderate assistance   Lower Body Bathing: Moderate assistance;Sit to/from stand   Upper Body Dressing : Moderate assistance;Sitting   Lower Body Dressing: Moderate assistance;Sit to/from stand   Toilet Transfer: Minimal assistance;Ambulation;BSC;RW   Toileting- Clothing Manipulation and Hygiene: Maximal assistance;Sit to/from stand Toileting - Clothing Manipulation Details (indicate cue type and reason): Pt incontinent of stool and requires assist for peri care.  Pt unaware that he had a BM      Functional mobility during ADLs: Minimal assistance;Rolling walker General ADL Comments: Pt requires assist due to cognitive deficits and difficulty sustaining attention      Vision     Perception     Praxis      Pertinent Vitals/Pain       Hand Dominance Right   Extremity/Trunk Assessment Upper Extremity Assessment Upper Extremity Assessment: Overall WFL for tasks assessed   Lower Extremity Assessment Lower Extremity Assessment: Defer to PT evaluation       Communication Communication Communication: No difficulties   Cognition Arousal/Alertness: Awake/alert Behavior During Therapy: WFL for tasks assessed/performed Overall Cognitive Status: No family/caregiver present to determine baseline cognitive functioning (  Pt with h/o dementia )                     General Comments       Exercises       Shoulder Instructions      Home Living Family/patient expects to be discharged to:: Skilled nursing facility                                  Additional Comments: Per chart family is unable to continue providing care for pt and wish to pursue SNF vs. ALF       Prior Functioning/Environment          Comments: No family present to confirm.  Pt unable to provide info.    OT Diagnosis: Generalized weakness;Cognitive deficits   OT Problem List: Decreased strength;Decreased activity tolerance;Impaired balance (sitting and/or standing);Decreased cognition;Decreased safety awareness;Decreased knowledge of use of DME or AE;Cardiopulmonary status limiting activity   OT Treatment/Interventions: Self-care/ADL training;DME and/or AE instruction;Therapeutic activities;Cognitive remediation/compensation;Patient/family education;Balance training    OT Goals(Current goals can be found in the care plan section) Acute Rehab OT Goals OT Goal Formulation: Patient unable to participate in goal setting Time For Goal Achievement: 07/05/14 Potential to Achieve Goals: Good ADL Goals Pt Will Perform Grooming: with min guard assist;standing Pt Will Perform Upper Body Bathing: with supervision;sitting Pt Will Perform Lower Body Bathing: with min guard assist;sit to/from stand Pt Will Transfer to Toilet: with min guard assist;ambulating;bedside commode;regular height toilet;grab bars Pt Will Perform Toileting - Clothing Manipulation and hygiene: with min guard assist;sit to/from stand  OT Frequency: Min 2X/week   Barriers to D/C: Decreased caregiver support          Co-evaluation PT/OT/SLP Co-Evaluation/Treatment: Yes Reason for Co-Treatment: For patient/therapist safety (due to cognitive deficits and incontinence ) PT goals addressed during session: Mobility/safety with mobility OT goals addressed during session: ADL's and self-care      End of Session Equipment Utilized During Treatment: Rolling walker Nurse Communication: Mobility status  Activity Tolerance: Other (comment) (diarrhea and incontinence ) Patient left: in  bed;with call bell/phone within reach;with bed alarm set   Time: 9774-1423 OT Time Calculation (min): 21 min Charges:  OT General Charges $OT Visit: 1 Procedure OT Evaluation $Initial OT Evaluation Tier I: 1 Procedure G-Codes:    Craig Henderson M 07/20/14, 12:25 PM

## 2014-06-21 NOTE — Progress Notes (Signed)
Pt had periods of nonsustained Vtach. HR in the 140's. Alene Mires, [place] was notified.    Alessandra Grout, RN

## 2014-06-21 NOTE — Progress Notes (Signed)
Called report to [facility] on [address] [place] for bed 27

## 2014-06-21 NOTE — Progress Notes (Addendum)
Shift Event: Pt with tachycardia HR 140s, and non sustained Vtach. Other VSS. EKG with Atrial flutter with variable AV block w/ PVCs ( similar to previous EKG). At bedside, pt is sleeping in NAD. Lungs- CTAB. Heart- tachycardic s1/s2. Abd-+bs, sntnd. Extrem- no edema  Atrial flutter with variable AV block w/ PVCs -w/ hx of A flutter on Xarelto, cardioverted by Dr. Einar Gip -Potassium Iris Pert normal -ordered metoprolol 5mg  IV once -Per MD note- cardiology consulted, however no note from Cards.   [place] [facility] [phone]

## 2014-06-21 NOTE — Assessment & Plan Note (Signed)
Cardioversion for A. Flutter on 05/11/2013

## 2014-06-21 NOTE — Clinical Social Work Note (Addendum)
CSW met with patients family to discuss patient being placed in SNF or ALF at time of discharge.  CSW spoke with pt daughter, Craig Henderson- and [name], they are concerned that patients dementia and continuing medical issues are becoming too much for patients spouse to deal with- they wish to discuss long term placement or home health options.  CSW provided family with SNF list and ALF list and explained process of placing patient into SNF from the hospital.  CSW spoke with patients spouse over the phone and confirmed that she was agreeable to SNF placement following DC from the hospital- patient spouse is agreeable and understands that the patient has too many needs at this time and she is unable to properly care for him at home by herself.  CSW will continue to follow.  Domenica Reamer, [place] Social Worker [phone]

## 2014-06-21 NOTE — Clinical Social Work Placement (Signed)
Clinical Social Work Department CLINICAL SOCIAL WORK PLACEMENT NOTE 06/21/2014  Patient:  Craig Henderson, Craig Henderson  Account Number:  [id number] Admit date:  06/18/2014  Clinical Social Worker:  Domenica Reamer, CLINICAL SOCIAL WORKER  Date/time:  06/21/2014 03:43 PM  Clinical Social Work is seeking post-discharge placement for this patient at the following level of care:   SKILLED NURSING   (*CSW will update this form in Epic as items are completed)   06/21/2014  Patient/family provided with [place] Department of Clinical Social Work's list of facilities offering this level of care within the geographic area requested by the patient (or if unable, by the patient's family).  06/21/2014  Patient/family informed of their freedom to choose among providers that offer the needed level of care, that participate in Medicare, Medicaid or managed care program needed by the patient, have an available bed and are willing to accept the patient.  06/21/2014  Patient/family informed of [facility]' ownership interest in [facility], as well as of the fact that they are under no obligation to receive care at this facility.  PASARR submitted to EDS on 06/21/2014 PASARR number received on 06/21/2014  FL2 transmitted to all facilities in geographic area requested by pt/family on  06/21/2014 FL2 transmitted to all facilities within larger geographic area on   Patient informed that his/her managed care company has contracts with or will negotiate with  certain facilities, including the following:     Patient/family informed of bed offers received:   Patient chooses bed at  Physician recommends and patient chooses bed at    Patient to be transferred to  on   Patient to be transferred to facility by  Patient and family notified of transfer on  Name of family member notified:    The following physician request were entered in Epic:   Additional Comments: Domenica Reamer, [place] Social  Worker [phone]

## 2014-06-21 NOTE — Progress Notes (Addendum)
[place] TEAM 1 - Stepdown/ICU TEAM Progress Note  Craig Henderson [id number]:[id number] DOB: 1929/06/20 DOA: 06/18/2014 PCP: Laverda Page, MD  Admit HPI / Brief Narrative: 81 y.o. male W/ Hx of Prostate cancer s/p XRT (2013); Diabetes mellitus; Hypertension; Hypercholesterolemia; BPH; and Mild dementia who presented with 3 days of progressive fatigue and lethargy with slurred speech. He stopped eating but had been drinking water and Ensure.  Patient had an ulcer on his left foot wife noticed the week of his admit.  On arrival to ER he was noted to have Left foot ulcer that seemed infected. CXR was worrisome for PNA. Patient was noted to be in A.fib with RVR with hr up to 160's.  Patient met sepsis criteria with fever 102, HR 163, RR 26 and hypotension down to 95/60. He was given 3.5L of NS and started on Vanc and Zosyn.  HPI/Subjective: Pt is alert but confused.  He denies any complaints, but his hx is presently of questionable reliability.  His RN reports that he is having frequent loose stools.   Assessment/Plan:  Sepsis due to UTI  -sepsis physiology has resolved w/ volume and tx of UTI   ENTEROBACTER CLOACAE UTI -sensitive to rocephin therefore will narrow abx and follow   Left great toe wound / blister -Consulted orthopedic surgery - to f/u w/ Podiatry as outpt - Xray did not show any evidence of osteomyelitis - per orthopedic surgery no abscess, large blister with loose nail; blister was resected  Diarrhea C diff negative - likley due to pyelo +/- abx use - immodium prn    ?CAP -CXR 3/11 suggested L mid lung and L basilar opacities - no clinical signs to suggest active pulmonary infection - f/u CXR - simplifying abx   Hypertension  -returning now that volume and sepsis corrected - no BP meds listed in home med regimen - initiate BB and follow   Atrial fibrillation / Atrial flutter -05/11/13 saw Dr. Adrian Prows for DCCV for atrial flutter - on chronic xarelto  -patient was  initially in A. fib with RVR in the setting of sepsis - responded to IV fluids -Echocardiogram; see results below  -Mildly elevated cardiac enzymes most likely demand ischemia - enzymes have trended down -begin BB today and follow HR trend    Pericardial effusion (moderate) -Consulted cardiology; Dr. Adrian Prows [facility] cardiology  Diabetes mellitus type 2, uncontrolled -A1c pending -Lipid panel within ADA guidelines -Continue sensitive SSI  Dementia -Spoke with Craig Henderson (daughter) [phone] and her husband who stated that patient's mental status has been steadily declining since~2014. This has resulted in patient's driving privileges being revoked. They state that patient has episodes of confusion, not knowing where he is, not knowing why he is there.   -daughter feels that patient no longer able to care for himself at home and interested in having patient placed when ready for discharge -Consult to social work for help with placement discharge; will speak with regular social worker on Monday -Reconsulted chaplain for help in obtaining official HCPOA  Moderate malnutrition -Ensure complete TID between meals  Code Status: FULL Family Communication: no family present at time of exam Disposition Plan: stable for transfer to tele bed - begin PT/OT - strive for better HR control - will need eventual SNF placement   Consultants: Orthopedic surgery  Procedure/Significant Events: 3/11 PCXR Left midlung and left base opacities worrisome for pneumonia. 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  -No acute intracranial process. 3/12 echocardiogram;- LVEF= 60% to 65%.  - Pulmonary arteries: PA peak pressure: 21 mm Hg (S). - Pericardium, extracardiac: moderate pericardial effusion; no obvious tamponade physiology based on respiratory variation in mitral  inflow.  Antibiotics: Zosyn 3/12 > 3/13 Vancomycin 3/11 > 3/13 Rocephin 3/14 >  DVT prophylaxis: Xarelto  Objective: Blood pressure 155/80, pulse 73, temperature 98.4 F (36.9 C), temperature source Oral, resp. rate 18, height 5' 6"  (1.676 m), weight 66.815 kg (147 lb 4.8 oz), SpO2 100 %.  Intake/Output Summary (Last 24 hours) at 06/21/14 1110 Last data filed at 06/21/14 1101  Gross per 24 hour  Intake   2880 ml  Output   2250 ml  Net    630 ml   Exam: General:  No acute respiratory distress Lungs: CTA th/o - no wheeze - no focal crackles appreciated  Cardiovascular: Irregular irregular rhythm - rate ~100, without murmur gallop or rub  Abdomen: Nontender, nondistended, soft, bowel sounds positive, no rebound, no ascites, no appreciable mass Extremities: No significant cyanosis, clubbing or edema - L great toe dressed and dry   Data Reviewed: Basic Metabolic Panel:  Recent Labs Lab 06/18/14 1720 06/19/14 0642 06/20/14 0415 06/21/14 0058 06/21/14 0525  NA 136 142 144  --  147*  K 4.5 4.1 4.1  --  4.1  CL 106 112 113*  --  117*  CO2 20 25 24   --  23  GLUCOSE 243* 127* 236*  --  127*  BUN 20 14 20   --  17  CREATININE 1.42* 1.16 1.43*  --  1.14  CALCIUM 8.6 8.3* 8.6  --  8.7  MG  --   --  2.0 1.9 1.9   Liver Function Tests:  Recent Labs Lab 06/18/14 1720 06/19/14 0642 06/20/14 0415 06/21/14 0525  AST 28 19 22  32  ALT 24 18 22  39  ALKPHOS 68 53 70 70  BILITOT 1.2 QUANTITY NOT SUFFICIENT, UNABLE TO PERFORM TEST 0.5 0.6  PROT 7.0 6.2 6.5 6.4  ALBUMIN 3.4* 2.8* 2.8* 2.8*   CBC:  Recent Labs Lab 06/18/14 1720 06/19/14 0642 06/20/14 0415 06/21/14 0525  WBC 19.7* 12.8* 12.3* 15.1*  NEUTROABS 17.7* 10.7* 10.8* 12.9*  HGB 11.4* 10.1* 9.8* 9.3*  HCT 34.0* 29.0* 28.6* 28.0*  MCV 95.5 93.2 93.2 94.6  PLT 198 PLATELETS APPEAR DECREASED 171 185   Cardiac Enzymes:  Recent Labs Lab 06/19/14 0222 06/19/14 0642 06/19/14 1005 06/19/14 1600 06/19/14 2300   TROPONINI 0.05* 0.04* 0.03 0.03 <0.03   CBG:  Recent Labs Lab 06/19/14 0040 06/19/14 0512 06/19/14 0816  GLUCAP 182* 120* 113*    Recent Results (from the past 240 hour(s))  Culture, blood (routine x 2)     Status: None (Preliminary result)   Collection Time: 06/18/14  4:20 PM  Result Value Ref Range Status   Specimen Description BLOOD RIGHT ARM  5 ML IN [facility] BOTTLE  Final   Special Requests NONE  Final   Culture   Final           BLOOD CULTURE RECEIVED NO GROWTH TO DATE CULTURE WILL BE HELD FOR 5 DAYS BEFORE ISSUING A FINAL NEGATIVE REPORT Performed at [facility]    Report Status PENDING  Incomplete  Culture, blood (routine x 2)     Status: None (Preliminary result)   Collection Time: 06/18/14  5:52 PM  Result Value Ref Range Status   Specimen Description BLOOD  LEFT ARM  5 ML IN [facility] BOTTLE  Final   Special Requests NONE  Final   Culture   Final           BLOOD CULTURE RECEIVED NO GROWTH TO DATE CULTURE WILL BE HELD FOR 5 DAYS BEFORE ISSUING A FINAL NEGATIVE REPORT Performed at [facility]    Report Status PENDING  Incomplete  Urine culture     Status: None   Collection Time: 06/18/14  6:06 PM  Result Value Ref Range Status   Specimen Description URINE, CATHETERIZED  Final   Special Requests NONE  Final   Colony Count   Final    >=100,000 COLONIES/ML Performed at [facility]    Culture   Final    ENTEROBACTER CLOACAE Performed at [facility]    Report Status 06/21/2014 FINAL  Final   Organism ID, Bacteria ENTEROBACTER CLOACAE  Final      Susceptibility   Enterobacter cloacae - MIC*    CEFAZOLIN RESISTANT      CEFTRIAXONE <=1 SENSITIVE Sensitive     CIPROFLOXACIN <=0.25 SENSITIVE Sensitive     GENTAMICIN <=1 SENSITIVE Sensitive     LEVOFLOXACIN <=0.12 SENSITIVE Sensitive     NITROFURANTOIN <=16 SENSITIVE Sensitive     TOBRAMYCIN <=1 SENSITIVE Sensitive     TRIMETH/SULFA <=20 SENSITIVE Sensitive     PIP/TAZO <=4 SENSITIVE  Sensitive     * ENTEROBACTER CLOACAE  MRSA PCR Screening     Status: None   Collection Time: 06/19/14 12:20 AM  Result Value Ref Range Status   MRSA by PCR NEGATIVE NEGATIVE Final    Comment:        The GeneXpert MRSA Assay (FDA approved for NASAL specimens only), is one component of a comprehensive MRSA colonization surveillance program. It is not intended to diagnose MRSA infection nor to guide or monitor treatment for MRSA infections.   Clostridium Difficile by PCR     Status: None   Collection Time: 06/21/14  2:30 AM  Result Value Ref Range Status   C difficile by pcr NEGATIVE NEGATIVE Final     Studies:  Recent x-ray studies have been reviewed in detail by the Attending Physician  Scheduled Meds:  Scheduled Meds: . atorvastatin  20 mg Oral Daily  . dextromethorphan-guaiFENesin  1 tablet Oral BID  . donepezil  10 mg Oral QHS  . feeding supplement (ENSURE COMPLETE)  237 mL Oral TID  . insulin aspart  0-9 Units Subcutaneous 6 times per day  . latanoprost  1 drop Both Eyes QHS  . memantine  28 mg Oral Daily  . methylPREDNISolone (SOLU-MEDROL) injection  60 mg Intravenous Q24H  . oxyCODONE  5 mg Oral Once  . piperacillin-tazobactam (ZOSYN)  IV  3.375 g Intravenous 3 times per day  . rivaroxaban  15 mg Oral Q supper  . sodium chloride  500 mL Intravenous Once  . tamsulosin  0.8 mg Oral QHS  . traZODone  50 mg Oral QHS  . vancomycin  750 mg Intravenous Q24H    Time spent on care of this patient: 35 mins  Cherene Altes, MD [facility] For Consults/Admissions - Flow Manager - [phone] Office  [phone]  Contact MD directly via text page:      [email or web address]      password [facility]  06/21/2014, 11:10 AM   LOS: 3 days

## 2014-06-21 NOTE — Progress Notes (Cosign Needed)
    MBS completed. See full report in imaging. GO     Bermudez-Bosch, Larine Fielding 06/21/2014, 2:53 PM

## 2014-06-22 ENCOUNTER — Inpatient Hospital Stay (HOSPITAL_COMMUNITY): Payer: Medicare Other

## 2014-06-22 DIAGNOSIS — N390 Urinary tract infection, site not specified: Secondary | ICD-10-CM

## 2014-06-22 LAB — BASIC METABOLIC PANEL
ANION GAP: 5 (ref 5–15)
BUN: 18 mg/dL (ref 6–23)
CO2: 27 mmol/L (ref 19–32)
CREATININE: 1.15 mg/dL (ref 0.50–1.35)
Calcium: 8.8 mg/dL (ref 8.4–10.5)
Chloride: 114 mmol/L — ABNORMAL HIGH (ref 96–112)
GFR calc Af Amer: 66 mL/min — ABNORMAL LOW (ref >90)
GFR calc non Af Amer: 57 mL/min — ABNORMAL LOW (ref >90)
GLUCOSE: 221 mg/dL — AB (ref 70–99)
Potassium: 3.8 mmol/L (ref 3.5–5.1)
Sodium: 146 mmol/L — ABNORMAL HIGH (ref 135–145)

## 2014-06-22 LAB — CBC
HEMATOCRIT: 27.1 % — AB (ref 39.0–52.0)
HEMOGLOBIN: 9 g/dL — AB (ref 13.0–17.0)
MCH: 31.4 pg (ref 26.0–34.0)
MCHC: 33.2 g/dL (ref 30.0–36.0)
MCV: 94.4 fL (ref 78.0–100.0)
Platelets: 180 10*3/uL (ref 150–400)
RBC: 2.87 MIL/uL — ABNORMAL LOW (ref 4.22–5.81)
RDW: 13.7 % (ref 11.5–15.5)
WBC: 14.5 10*3/uL — AB (ref 4.0–10.5)

## 2014-06-22 LAB — GLUCOSE, CAPILLARY
GLUCOSE-CAPILLARY: 193 mg/dL — AB (ref 70–99)
GLUCOSE-CAPILLARY: 266 mg/dL — AB (ref 70–99)
Glucose-Capillary: 164 mg/dL — ABNORMAL HIGH (ref 70–99)
Glucose-Capillary: 151 mg/dL — ABNORMAL HIGH (ref 70–99)

## 2014-06-22 MED ORDER — CEFPODOXIME PROXETIL 200 MG PO TABS
200.0000 mg | ORAL_TABLET | Freq: Two times a day (BID) | ORAL | Status: DC
  Administered 2014-06-22 – 2014-06-24 (×5): 200 mg via ORAL
  Filled 2014-06-22 (×6): qty 1

## 2014-06-22 MED ORDER — LOPERAMIDE HCL 2 MG PO CAPS
2.0000 mg | ORAL_CAPSULE | Freq: Once | ORAL | Status: AC
  Administered 2014-06-22: 2 mg via ORAL
  Filled 2014-06-22: qty 1

## 2014-06-22 MED ORDER — METOPROLOL TARTRATE 25 MG PO TABS
25.0000 mg | ORAL_TABLET | Freq: Three times a day (TID) | ORAL | Status: DC
  Administered 2014-06-22 – 2014-06-24 (×7): 25 mg via ORAL
  Filled 2014-06-22 (×9): qty 1

## 2014-06-22 NOTE — Clinical Social Work Note (Signed)
CSW spoke with patients daughter- daughter will be by later and requested that bed offers be left at bedside for her review.  CSW will continue to follow.  Domenica Reamer, [place] Social Worker [phone]

## 2014-06-22 NOTE — Progress Notes (Deleted)
Medicare Important Message given? YES  (If response is "NO", the following Medicare IM given date fields will be blank)  Date Medicare IM given: 06/22/14 Medicare IM given by:  Dahlia Client [employer]

## 2014-06-22 NOTE — Progress Notes (Addendum)
Progress Note  JAKOB KIMBERLIN [id number]:[id number] DOB: 07-13-1929 DOA: 06/18/2014 PCP: Elyn Peers, MD  Brief Narrative: 81 y.o. male W/ Hx of Prostate cancer s/p XRT (2013); Diabetes mellitus; Hypertension; Hypercholesterolemia; BPH; and Mild dementia who presented with 3 days of progressive fatigue and lethargy with slurred speech. He stopped eating but had been drinking water and Ensure.  Patient had an ulcer on his left foot wife noticed the week of his admit.  On arrival to ER he was noted to have Left foot ulcer that seemed infected. CXR was worrisome for PNA. Patient was noted to be in A.fib with RVR with hr up to 160's.  Patient met sepsis criteria with fever 102, HR 163, RR 26 and hypotension down to 95/60. He was given 3.5L of NS and started on Vanc and Zosyn. His urine culture grew Enterobacter. Patient was switched over to ceftriaxone. His heart rate remained elevated. He was seen by cardiology.  Subjective: Patient denies any chest pain or shortness of breath. No nausea, vomiting.   Assessment/Plan:  Sepsis due to UTI  -sepsis physiology has resolved w/ volume and tx of UTI   ENTEROBACTER CLOACAE UTI -. Sensitivities have been reviewed. He'll be changed over to oral cefpodoxime.   Left great toe wound / blister -Consulted orthopedic surgery - to f/u w/ Podiatry as outpt - Xray did not show any evidence of osteomyelitis - per orthopedic surgery no abscess, large blister with loose nail; blister was resected  Diarrhea C diff negative - likley due to pyelo +/- abx use - immodium prn    ?CAP -CXR 3/11 suggested L mid lung and L basilar opacities - no clinical signs to suggest active pulmonary infection - f/u CXR - simplifying abx   history of essential Hypertension  -Blood pressure is stable. Continue beta blocker.   Atrial fibrillation / Atrial flutter -05/11/13 saw Dr. Adrian Prows for DCCV for atrial flutter - on chronic xarelto  -patient was initially in A. fib with RVR in  the setting of sepsis - responded to IV fluids -Echocardiogram; see results below  -Mildly elevated cardiac enzymes most likely demand ischemia - enzymes have trended down Heart rate continues to be elevated. Beta blocker dose will be increased today.  Pericardial effusion (moderate) -Consulted cardiology; Dr. Adrian Prows [facility] cardiology. His input is appreciated. Conservative approach is being utilized. Echocardiogram to be repeated in 6-8 weeks.  Diabetes mellitus type 2, uncontrolled -A1c 5.1 -Lipid panel within ADA guidelines -Continue sensitive SSI  Dementia -According to family patient's mental status has been steadily declining since~2014. This has resulted in patient's driving privileges being revoked. They state that patient has episodes of confusion, not knowing where he is, not knowing why he is there.   -daughter feels that patient no longer able to care for himself at home and interested in having patient placed when ready for discharge  Moderate malnutrition -Ensure complete TID between meals  DVT prophylaxis: On Xarelto  Code Status: FULL Family Communication: no family present at time of exam Disposition Plan: PT/OT. Heart rate needs to be better controlled. Will need SNF. Anticipate discharge once heart rate is better controlled.  Consultants: Orthopedic surgery Cardiology  Procedure/Significant Events: 3/11 PCXR Left midlung and left base opacities worrisome for pneumonia. 3/11 left foot x-ray No definite acute osseous abnormalities 3/12 CT head without contrast -Remote prior infarcts in the cerebellum and left basal ganglia.  -No acute intracranial process. 3/12 echocardiogram;- LVEF= 60% to 65%.  - Pulmonary arteries: PA peak  pressure: 21 mm Hg (S). - Pericardium, extracardiac: moderate pericardial effusion; no obvious tamponade physiology based on respiratory variation in mitral inflow.  Antibiotics: Zosyn 3/12 > 3/13 Vancomycin 3/11 > 3/13 Rocephin  3/14 >3/15 Vantin 3/15  DVT prophylaxis: Xarelto  Objective: Blood pressure 140/85, pulse 73, temperature 98.8 F (37.1 C), temperature source Oral, resp. rate 18, height 5' 6"  (1.676 m), weight 66.815 kg (147 lb 4.8 oz), SpO2 100 %.  Intake/Output Summary (Last 24 hours) at 06/22/14 0957 Last data filed at 06/22/14 0800  Gross per 24 hour  Intake   1409 ml  Output   1953 ml  Net   -544 ml   Exam: General:  No acute distress Lungs: CTA - no wheeze - no crackles appreciated  Cardiovascular: Irregular irregular rhythm, without murmur gallop or rub  Abdomen: Nontender, nondistended, soft, bowel sounds positive, no rebound, no ascites, no appreciable mass Extremities: No significant cyanosis, clubbing or edema - L great toe dressed and dry   Data Reviewed: Basic Metabolic Panel:  Recent Labs Lab 06/18/14 1720 06/19/14 0642 06/20/14 0415 06/21/14 0058 06/21/14 0525 06/22/14 0351  NA 136 142 144  --  147* 146*  K 4.5 4.1 4.1  --  4.1 3.8  CL 106 112 113*  --  117* 114*  CO2 20 25 24   --  23 27  GLUCOSE 243* 127* 236*  --  127* 221*  BUN 20 14 20   --  17 18  CREATININE 1.42* 1.16 1.43*  --  1.14 1.15  CALCIUM 8.6 8.3* 8.6  --  8.7 8.8  MG  --   --  2.0 1.9 1.9  --    Liver Function Tests:  Recent Labs Lab 06/18/14 1720 06/19/14 0642 06/20/14 0415 06/21/14 0525  AST 28 19 22  32  ALT 24 18 22  39  ALKPHOS 68 53 70 70  BILITOT 1.2 QUANTITY NOT SUFFICIENT, UNABLE TO PERFORM TEST 0.5 0.6  PROT 7.0 6.2 6.5 6.4  ALBUMIN 3.4* 2.8* 2.8* 2.8*   CBC:  Recent Labs Lab 06/18/14 1720 06/19/14 0642 06/20/14 0415 06/21/14 0525 06/22/14 0351  WBC 19.7* 12.8* 12.3* 15.1* 14.5*  NEUTROABS 17.7* 10.7* 10.8* 12.9*  --   HGB 11.4* 10.1* 9.8* 9.3* 9.0*  HCT 34.0* 29.0* 28.6* 28.0* 27.1*  MCV 95.5 93.2 93.2 94.6 94.4  PLT 198 PLATELETS APPEAR DECREASED 171 185 180   Cardiac Enzymes:  Recent Labs Lab 06/19/14 0222 06/19/14 0642 06/19/14 1005 06/19/14 1600  06/19/14 2300  TROPONINI 0.05* 0.04* 0.03 0.03 <0.03   CBG:  Recent Labs Lab 06/21/14 0820 06/21/14 1212 06/21/14 1656 06/21/14 2107 06/22/14 0626  GLUCAP 160* 192* 302* 276* 164*    Recent Results (from the past 240 hour(s))  Culture, blood (routine x 2)     Status: None (Preliminary result)   Collection Time: 06/18/14  4:20 PM  Result Value Ref Range Status   Specimen Description BLOOD RIGHT ARM  5 ML IN [facility] BOTTLE  Final   Special Requests NONE  Final   Culture   Final           BLOOD CULTURE RECEIVED NO GROWTH TO DATE CULTURE WILL BE HELD FOR 5 DAYS BEFORE ISSUING A FINAL NEGATIVE REPORT Performed at [facility]    Report Status PENDING  Incomplete  Culture, blood (routine x 2)     Status: None (Preliminary result)   Collection Time: 06/18/14  5:52 PM  Result Value Ref Range Status   Specimen Description BLOOD  LEFT ARM  5 ML IN [facility] BOTTLE  Final   Special Requests NONE  Final   Culture   Final           BLOOD CULTURE RECEIVED NO GROWTH TO DATE CULTURE WILL BE HELD FOR 5 DAYS BEFORE ISSUING A FINAL NEGATIVE REPORT Performed at [facility]    Report Status PENDING  Incomplete  Urine culture     Status: None   Collection Time: 06/18/14  6:06 PM  Result Value Ref Range Status   Specimen Description URINE, CATHETERIZED  Final   Special Requests NONE  Final   Colony Count   Final    >=100,000 COLONIES/ML Performed at [facility]    Culture   Final    ENTEROBACTER CLOACAE Performed at [facility]    Report Status 06/21/2014 FINAL  Final   Organism ID, Bacteria ENTEROBACTER CLOACAE  Final      Susceptibility   Enterobacter cloacae - MIC*    CEFAZOLIN RESISTANT      CEFTRIAXONE <=1 SENSITIVE Sensitive     CIPROFLOXACIN <=0.25 SENSITIVE Sensitive     GENTAMICIN <=1 SENSITIVE Sensitive     LEVOFLOXACIN <=0.12 SENSITIVE Sensitive     NITROFURANTOIN <=16 SENSITIVE Sensitive     TOBRAMYCIN <=1 SENSITIVE Sensitive      TRIMETH/SULFA <=20 SENSITIVE Sensitive     PIP/TAZO <=4 SENSITIVE Sensitive     * ENTEROBACTER CLOACAE  MRSA PCR Screening     Status: None   Collection Time: 06/19/14 12:20 AM  Result Value Ref Range Status   MRSA by PCR NEGATIVE NEGATIVE Final    Comment:        The GeneXpert MRSA Assay (FDA approved for NASAL specimens only), is one component of a comprehensive MRSA colonization surveillance program. It is not intended to diagnose MRSA infection nor to guide or monitor treatment for MRSA infections.   Clostridium Difficile by PCR     Status: None   Collection Time: 06/21/14  2:30 AM  Result Value Ref Range Status   C difficile by pcr NEGATIVE NEGATIVE Final     Scheduled Meds:  Scheduled Meds: . atorvastatin  20 mg Oral Daily  . cefpodoxime  200 mg Oral Q12H  . dextromethorphan-guaiFENesin  1 tablet Oral BID  . donepezil  10 mg Oral QHS  . feeding supplement (ENSURE COMPLETE)  237 mL Oral TID  . insulin aspart  0-9 Units Subcutaneous TID WC  . latanoprost  1 drop Both Eyes QHS  . loperamide  2 mg Oral Once  . memantine  28 mg Oral Daily  . metoprolol tartrate  25 mg Oral TID  . oxyCODONE  5 mg Oral Once  . rivaroxaban  15 mg Oral Q supper  . sodium chloride  500 mL Intravenous Once  . tamsulosin  0.8 mg Oral QHS  . traZODone  50 mg Oral QHS    Time spent on care of this patient: 35 mins  [facility] [facility] [phone]  Contact MD directly via text page:      [email or web address]      password [facility]  06/22/2014, 9:57 AM   LOS: 4 days

## 2014-06-22 NOTE — Progress Notes (Signed)
Speech Language Pathology Treatment: Dysphagia  Patient Details Name: Craig Henderson MRN: [id number] DOB: Feb 05, 1930 Today's Date: 06/22/2014 Time: 2800-3491 SLP Time Calculation (min) (ACUTE ONLY): 11 min  Assessment / Plan / Recommendation Clinical Impression  F/u after yesterday's MBS.  Reviewed results of MBS - pt able to recall necessity of chin tuck, but required mod verbal and tactile cues to execute chin tuck with consumption of nectar-thick liquids.  Overt coughing with head in neutral position, concerning for aspiration. Recommend continued f/u for safety and precautions and SLP f/u once D/Cd to SNF.    HPI HPI: Craig Henderson is a 81 y.o. male  has a past medical history of Prostate cancer (07/18/11); Diabetes mellitus; Hypertension; Hypercholesterolemia; BPH (benign prostatic hyperplasia); ED (erectile dysfunction); radiation therapy (10/23/11 - 12/18/11); and Mild dementia. He presented with 3 days of progressive fatigue and lethargy this morning as well as slurred speech. He has stopped eating but has been drinking water and Ensure. The patient has Ulcer on the left foot wife noticed it this week. The patient's wife reports subjective fever and some cough. On arrival to ER he was noted to have Left foot ulcer that seemed infected. CXR worrisome for PNA.The patient met sepsis criteria  He was given 3.5L of NS and started on Vanc and Zosyn. Currently vitals have stabilized. Patient is diabetic but has not been checking his glucose at home. He has significant dementia but is currently able to follow commands.    Pertinent Vitals Pain Assessment: No/denies pain  SLP Plan  Continue with current plan of care    Recommendations Diet recommendations: Dysphagia 3 (mechanical soft);Nectar-thick liquid Liquids provided via: Cup Medication Administration: Crushed with puree Supervision: Full supervision/cueing for compensatory strategies Compensations: Small sips/bites;Slow  rate Postural Changes and/or Swallow Maneuvers: Out of bed for meals;Chin tuck              Oral Care Recommendations: Oral care BID Plan: Continue with current plan of care   Jazline Cumbee L. Tivis Ringer, [place] CCC/SLP Pager [phone]      [name] 06/22/2014, 3:21 PM

## 2014-06-22 NOTE — Progress Notes (Signed)
Medicare Important Message given? YES  (If response is "NO", the following Medicare IM given date fields will be blank)  Date Medicare IM given: 06/22/14 Medicare IM given by:  Dahlia Client [employer]

## 2014-06-23 LAB — CBC
HEMATOCRIT: 27.8 % — AB (ref 39.0–52.0)
Hemoglobin: 9.3 g/dL — ABNORMAL LOW (ref 13.0–17.0)
MCH: 32.1 pg (ref 26.0–34.0)
MCHC: 33.5 g/dL (ref 30.0–36.0)
MCV: 95.9 fL (ref 78.0–100.0)
Platelets: 188 10*3/uL (ref 150–400)
RBC: 2.9 MIL/uL — ABNORMAL LOW (ref 4.22–5.81)
RDW: 13.7 % (ref 11.5–15.5)
WBC: 15.6 10*3/uL — AB (ref 4.0–10.5)

## 2014-06-23 LAB — BASIC METABOLIC PANEL
ANION GAP: 9 (ref 5–15)
BUN: 17 mg/dL (ref 6–23)
CALCIUM: 8.7 mg/dL (ref 8.4–10.5)
CHLORIDE: 109 mmol/L (ref 96–112)
CO2: 22 mmol/L (ref 19–32)
Creatinine, Ser: 1.02 mg/dL (ref 0.50–1.35)
GFR calc Af Amer: 77 mL/min — ABNORMAL LOW (ref >90)
GFR, EST NON AFRICAN AMERICAN: 66 mL/min — AB (ref >90)
Glucose, Bld: 220 mg/dL — ABNORMAL HIGH (ref 70–99)
POTASSIUM: 3.7 mmol/L (ref 3.5–5.1)
Sodium: 140 mmol/L (ref 135–145)

## 2014-06-23 LAB — GLUCOSE, CAPILLARY
GLUCOSE-CAPILLARY: 168 mg/dL — AB (ref 70–99)
GLUCOSE-CAPILLARY: 154 mg/dL — AB (ref 70–99)
GLUCOSE-CAPILLARY: 198 mg/dL — AB (ref 70–99)

## 2014-06-23 NOTE — Progress Notes (Signed)
PATIENT HYPOTENSIVE AFTER RECEIVING H.S. BETA BLOCKER. PATIENT DENIES ANY COMPLAINTS. WILL RE-ASSESS.

## 2014-06-23 NOTE — Progress Notes (Signed)
PROGRESS NOTE  [facility] SEG:[id number] DOB: 07-Apr-1930 DOA: 06/18/2014 PCP: Craig Peers, MD  HPI/Recap of past 6 hours: 81 year old male with past mental history of prostate cancer, diabetes mellitus, hypertension and mild dementia presented to emergency room on 3/11 with 3 days of lethargy and slurred speech and decreased by mouth intake. Patient noted to have ulcer on left foot that had been ongoing for past one week that looked infected. In addition noted to be in rapid A. fib and chest x-ray concerning for pneumonia as well as noted to have UTI. Patient met sepsis criteria and started on broad-spectrum metabolic, aggressive IV fluids.  Urine positive for Enterobacter Cloacae and antibiotics adjusted. . Toe wound examined by orthopedic surgery who felt no abscess. No evidence of ostium myelitis by x-ray. Heart rate remained elevated even after treating sepsis and dehydration and beta blocker adjusted.  Today patient is doing okay. No complaints. Heart rate has started to improve. With episodes of decreasing mentation and increasing care needed, family looking for placement.  Assessment/Plan: Active Problems:   Hypertension   Diabetes mellitus type 2, uncontrolled   Atrial flutter: Started on chronic Xarelto last month. Elevated troponins felt to be in the setting of atrial fibrillation. Trending down. Heart rate stayed elevated so beta blocker increased as a 3/15. Heart rate improved today.   Sepsis secondary to community-acquired pneumonia: Patient met criteria given tachycardia, pneumonia source mild tachypnea and elevated pro calcitonin level.    Dementia: Skilled nursing. No behavioral disturbance   CAP (community acquired pneumonia)    DM foot ulcers: Seen by orthopedic surgery. No evidence of abscess, large blister was noted to be with loose nail. Blister resected. No evidence of ostial myelitis by x-ray.   Diabetes type 2, uncontrolled UTI: As above. Currently on oral  medication    Pericardial effusion: Noted on x-ray. Discussed with cardiology and conservative approach managed. Repeat echocardiogram in 6-8 weeks.   Moderate malnutrition: Patient meets criteria in the context of acute illness as evidenced by weight loss of 8% in the past one month and intake that met less than 75% of estimated needs for greater than one week. Seen by nutrition and started on ensure enlive by mouth 3 times a day   Code Status: Full code  Family Communication: Left message with family  Disposition Plan: Skilled nursing facility in the next 1-2 days   Consultants:  Cardiology  Orthopedic surgery  Procedures:  Echocardiogram done 3/12: Moderate pericardial effusion, preserved ejection fraction. PA peak pressure at 21  Antibiotics: Zosyn 3/12 > 3/13 Vancomycin 3/11 > 3/13 Rocephin 3/14 >3/15 Vantin 3/15   Objective: BP 107/80 mmHg  Pulse 81  Temp(Src) 97.8 F (36.6 C) (Oral)  Resp 18  Ht 5' 6"  (1.676 m)  Wt 66.815 kg (147 lb 4.8 oz)  BMI 23.79 kg/m2  SpO2 99%  Intake/Output Summary (Last 24 hours) at 06/23/14 1843 Last data filed at 06/23/14 1230  Gross per 24 hour  Intake    480 ml  Output   1353 ml  Net   -873 ml   Filed Weights   06/18/14 1642 06/19/14 0000 06/21/14 0448  Weight: 76.204 kg (168 lb) 63.368 kg (139 lb 11.2 oz) 66.815 kg (147 lb 4.8 oz)    Exam:   General:  Alert and oriented 2, no acute distress  Cardiovascular: Irregular rhythm, rate controlled  Respiratory: Clear to auscultation bilaterally  Abdomen: Soft, nontender, nondistended, positive bowel sounds  Musculoskeletal: No clubbing or cyanosis or  edema, left toe dressed   Data Reviewed: Basic Metabolic Panel:  Recent Labs Lab 06/19/14 0642 06/20/14 0415 06/21/14 0058 06/21/14 0525 06/22/14 0351 06/23/14 0442  NA 142 144  --  147* 146* 140  K 4.1 4.1  --  4.1 3.8 3.7  CL 112 113*  --  117* 114* 109  CO2 25 24  --  23 27 22   GLUCOSE 127* 236*  --  127*  221* 220*  BUN 14 20  --  17 18 17   CREATININE 1.16 1.43*  --  1.14 1.15 1.02  CALCIUM 8.3* 8.6  --  8.7 8.8 8.7  MG  --  2.0 1.9 1.9  --   --    Liver Function Tests:  Recent Labs Lab 06/18/14 1720 06/19/14 0642 06/20/14 0415 06/21/14 0525  AST 28 19 22  32  ALT 24 18 22  39  ALKPHOS 68 53 70 70  BILITOT 1.2 QUANTITY NOT SUFFICIENT, UNABLE TO PERFORM TEST 0.5 0.6  PROT 7.0 6.2 6.5 6.4  ALBUMIN 3.4* 2.8* 2.8* 2.8*   No results for input(s): LIPASE, AMYLASE in the last 168 hours. No results for input(s): AMMONIA in the last 168 hours. CBC:  Recent Labs Lab 06/18/14 1720 06/19/14 0998 06/20/14 0415 06/21/14 0525 06/22/14 0351 06/23/14 0442  WBC 19.7* 12.8* 12.3* 15.1* 14.5* 15.6*  NEUTROABS 17.7* 10.7* 10.8* 12.9*  --   --   HGB 11.4* 10.1* 9.8* 9.3* 9.0* 9.3*  HCT 34.0* 29.0* 28.6* 28.0* 27.1* 27.8*  MCV 95.5 93.2 93.2 94.6 94.4 95.9  PLT 198 PLATELETS APPEAR DECREASED 171 185 180 188   Cardiac Enzymes:    Recent Labs Lab 06/19/14 0222 06/19/14 0642 06/19/14 1005 06/19/14 1600 06/19/14 2300  TROPONINI 0.05* 0.04* 0.03 0.03 <0.03   BNP (last 3 results) No results for input(s): BNP in the last 8760 hours.  ProBNP (last 3 results) No results for input(s): PROBNP in the last 8760 hours.  CBG:  Recent Labs Lab 06/22/14 0626 06/22/14 1131 06/22/14 1646 06/22/14 2135 06/23/14 1130  GLUCAP 164* 151* 193* 266* 154*    Recent Results (from the past 240 hour(s))  Culture, blood (routine x 2)     Status: None (Preliminary result)   Collection Time: 06/18/14  4:20 PM  Result Value Ref Range Status   Specimen Description BLOOD RIGHT ARM  5 ML IN [facility] BOTTLE  Final   Special Requests NONE  Final   Culture   Final           BLOOD CULTURE RECEIVED NO GROWTH TO DATE CULTURE WILL BE HELD FOR 5 DAYS BEFORE ISSUING A FINAL NEGATIVE REPORT Performed at [facility]    Report Status PENDING  Incomplete  Culture, blood (routine x 2)     Status: None  (Preliminary result)   Collection Time: 06/18/14  5:52 PM  Result Value Ref Range Status   Specimen Description BLOOD LEFT ARM  5 ML IN [facility] BOTTLE  Final   Special Requests NONE  Final   Culture   Final           BLOOD CULTURE RECEIVED NO GROWTH TO DATE CULTURE WILL BE HELD FOR 5 DAYS BEFORE ISSUING A FINAL NEGATIVE REPORT Performed at [facility]    Report Status PENDING  Incomplete  Urine culture     Status: None   Collection Time: 06/18/14  6:06 PM  Result Value Ref Range Status   Specimen Description URINE, CATHETERIZED  Final   Special Requests  NONE  Final   Colony Count   Final    >=100,000 COLONIES/ML Performed at [facility]    Culture   Final    ENTEROBACTER CLOACAE Performed at [facility]    Report Status 06/21/2014 FINAL  Final   Organism ID, Bacteria ENTEROBACTER CLOACAE  Final      Susceptibility   Enterobacter cloacae - MIC*    CEFAZOLIN RESISTANT      CEFTRIAXONE <=1 SENSITIVE Sensitive     CIPROFLOXACIN <=0.25 SENSITIVE Sensitive     GENTAMICIN <=1 SENSITIVE Sensitive     LEVOFLOXACIN <=0.12 SENSITIVE Sensitive     NITROFURANTOIN <=16 SENSITIVE Sensitive     TOBRAMYCIN <=1 SENSITIVE Sensitive     TRIMETH/SULFA <=20 SENSITIVE Sensitive     PIP/TAZO <=4 SENSITIVE Sensitive     * ENTEROBACTER CLOACAE  MRSA PCR Screening     Status: None   Collection Time: 06/19/14 12:20 AM  Result Value Ref Range Status   MRSA by PCR NEGATIVE NEGATIVE Final    Comment:        The GeneXpert MRSA Assay (FDA approved for NASAL specimens only), is one component of a comprehensive MRSA colonization surveillance program. It is not intended to diagnose MRSA infection nor to guide or monitor treatment for MRSA infections.   Clostridium Difficile by PCR     Status: None   Collection Time: 06/21/14  2:30 AM  Result Value Ref Range Status   C difficile by pcr NEGATIVE NEGATIVE Final     Studies: Dg Chest Port 1 View  06/22/2014   CLINICAL  DATA:  Pneumonia.  EXAM: PORTABLE CHEST - 1 VIEW  COMPARISON:  June 18, 2014.  FINDINGS: The heart size and mediastinal contours are within normal limits. No pneumothorax or pleural effusion is noted. No acute pulmonary disease is noted. Left lung opacities noted on prior exam have resolved. The visualized skeletal structures are unremarkable.  IMPRESSION: No acute cardiopulmonary abnormality seen.   Electronically Signed   By: Marijo Conception, M.D.   On: 06/22/2014 08:18    Scheduled Meds: . atorvastatin  20 mg Oral Daily  . cefpodoxime  200 mg Oral Q12H  . dextromethorphan-guaiFENesin  1 tablet Oral BID  . donepezil  10 mg Oral QHS  . feeding supplement (ENSURE COMPLETE)  237 mL Oral TID  . insulin aspart  0-9 Units Subcutaneous TID WC  . latanoprost  1 drop Both Eyes QHS  . memantine  28 mg Oral Daily  . metoprolol tartrate  25 mg Oral TID  . oxyCODONE  5 mg Oral Once  . rivaroxaban  15 mg Oral Q supper  . sodium chloride  500 mL Intravenous Once  . tamsulosin  0.8 mg Oral QHS  . traZODone  50 mg Oral QHS    Continuous Infusions: . sodium chloride 10 mL/hr (06/21/14 1157)     Time spent: 25 minutes  [facility] Pager [phone]. If 7PM-7AM, please contact night-coverage at [email or web address], password [facility] 06/23/2014, 6:43 PM  LOS: 5 days

## 2014-06-23 NOTE — Progress Notes (Signed)
Physical Therapy Treatment Patient Details Name: Craig Henderson MRN: [id number] DOB: 08/05/1929 Today's Date: 06/23/2014    History of Present Illness 81 y.o male admitted  with 3 days of progressive fatigue and lethargy and slurred speech.  He was noted to have infected Lt foot ulcer,  CXR worrisome for PNA.  He was also in AFib with RVR with HR up to 160s.  + for  UTI diagnosed with sepsis.   PMH:  includes DM; dementia; HTN    PT Comments    Much more agreeable to participate with gentle coaxing/conversation; Shows improving activity tolerance, with increased distance ambulated  Follow Up Recommendations  SNF;Supervision/Assistance - 24 hour     Equipment Recommendations  None recommended by PT    Recommendations for Other Services       Precautions / Restrictions Precautions Precautions: Fall    Mobility  Bed Mobility Overal bed mobility: Needs Assistance Bed Mobility: Supine to Sit     Supine to sit: Min guard;HOB elevated     General bed mobility comments: Moving very slowly, but initiated well, and did not need physical assist to transition form supine to sit; used bed rails; good initiation of reciprocal scoot to EOB in prep for standing  Transfers Overall transfer level: Needs assistance Equipment used: Rolling walker (2 wheeled) Transfers: Sit to/from Stand Sit to Stand: Min assist         General transfer comment: Min assist for stability in coming to standing  Ambulation/Gait Ambulation/Gait assistance: Min guard (with and without physical contact) Ambulation Distance (Feet): 30 Feet Assistive device: Rolling walker (2 wheeled) Gait Pattern/deviations: Decreased step length - right;Decreased step length - left;Decreased stride length;Trunk flexed Gait velocity: extremely slow Gait velocity interpretation: <1.8 ft/sec, indicative of risk for recurrent falls General Gait Details: Very slow moving, with noted deviations and heavy reliance on UE  support on RW   Stairs            Wheelchair Mobility    Modified Rankin (Stroke Patients Only)       Balance     Sitting balance-Leahy Scale: Good       Standing balance-Leahy Scale: Poor                      Cognition Arousal/Alertness: Awake/alert Behavior During Therapy: WFL for tasks assessed/performed Overall Cognitive Status: No family/caregiver present to determine baseline cognitive functioning (Pt with h/o dementia )                      Exercises      General Comments General comments (skin integrity, edema, etc.): HR 120 bpm at end of amb      Pertinent Vitals/Pain Pain Assessment: No/denies pain    Home Living                      Prior Function            PT Goals (current goals can now be found in the care plan section) Acute Rehab PT Goals Patient Stated Goal: Did not state, though he was pretty clear he likes to rest PT Goal Formulation: Patient unable to participate in goal setting Time For Goal Achievement: 07/05/14 Potential to Achieve Goals: Fair Progress towards PT goals: Progressing toward goals    Frequency  Min 2X/week    PT Plan Current plan remains appropriate    Co-evaluation  End of Session Equipment Utilized During Treatment: Gait belt Activity Tolerance: Patient tolerated treatment well Patient left: in chair;with call bell/phone within reach;with chair alarm set     Time: 9292-4462 PT Time Calculation (min) (ACUTE ONLY): 27 min  Charges:  $Gait Training: 8-22 mins $Therapeutic Activity: 8-22 mins                    G Codes:      Quin Hoop 06/23/2014, 3:33 PM  Roney Marion, [place] Pager [phone] Office [phone]

## 2014-06-23 NOTE — Progress Notes (Signed)
Speech Language Pathology Treatment: Dysphagia  Patient Details Name: Craig Henderson MRN: [id number] DOB: 12-27-1929 Today's Date: 06/23/2014 Time: 5809-9833 SLP Time Calculation (min) (ACUTE ONLY): 20 min  Assessment / Plan / Recommendation Clinical Impression  Pt is demonstrating improved swallow function.  He continues to require mod assist to execute chin tuck.  Without cues, he is unable to remember to follow-through.  With head in neutral, pt did not demonstrate any s/s of aspiration (per MBS, aspiration was associated with a delayed cough).  Recommend discontinuing use of chin tuck to allow for more independence with meals.  After oral care, recommend allowing regular water between meals.  Pt continues with s/s of decreased toleration of thins in conjunction with meals (mild throat clear), but his toleration improves with thins in isolation.  No family available for education. D/W MD.   HPI HPI: Craig Henderson is a 81 y.o. male  has a past medical history of Prostate cancer (07/18/11); Diabetes mellitus; Hypertension; Hypercholesterolemia; BPH (benign prostatic hyperplasia); ED (erectile dysfunction); radiation therapy (10/23/11 - 12/18/11); and Mild dementia. He presented with 3 days of progressive fatigue and lethargy this morning as well as slurred speech. He has stopped eating but has been drinking water and Ensure. The patient has Ulcer on the left foot wife noticed it this week. The patient's wife reports subjective fever and some cough. On arrival to ER he was noted to have Left foot ulcer that seemed infected. CXR worrisome for PNA.The patient met sepsis criteria  He was given 3.5L of NS and started on Vanc and Zosyn. Currently vitals have stabilized. Patient is diabetic but has not been checking his glucose at home. He has significant dementia but is currently able to follow commands.    Pertinent Vitals Pain Assessment: No/denies pain  SLP Plan  Continue with current plan of  care    Recommendations Diet recommendations: Dysphagia 3 (mechanical soft);Nectar-thick liquid Liquids provided via: Cup Medication Administration: Crushed with puree Supervision: Full supervision/cueing for compensatory strategies Compensations: Small sips/bites;Slow rate Postural Changes and/or Swallow Maneuvers: Seated upright 90 degrees       Allow thin water between meals and after oral care (continue nectars with meals)       Oral Care Recommendations: Oral care BID Follow up Recommendations: Skilled Nursing facility Plan: Continue with current plan of care   Craig Henderson L. Tivis Ringer, [place] CCC/SLP Pager [phone]      Craig Henderson 06/23/2014, 3:18 PM

## 2014-06-23 NOTE — Progress Notes (Signed)
A.M GLUCOSE 168. B/P BACK TO BASELINE.  PATIENT INSTRUCTED TO CALL FOR ASSISTANCE WHEN NEEDED.

## 2014-06-24 LAB — GLUCOSE, CAPILLARY
Glucose-Capillary: 167 mg/dL — ABNORMAL HIGH (ref 70–99)
Glucose-Capillary: 208 mg/dL — ABNORMAL HIGH (ref 70–99)
Glucose-Capillary: 187 mg/dL — ABNORMAL HIGH (ref 70–99)

## 2014-06-24 LAB — CBC
HEMATOCRIT: 28 % — AB (ref 39.0–52.0)
HEMOGLOBIN: 9.4 g/dL — AB (ref 13.0–17.0)
MCH: 31.4 pg (ref 26.0–34.0)
MCHC: 33.6 g/dL (ref 30.0–36.0)
MCV: 93.6 fL (ref 78.0–100.0)
Platelets: 219 10*3/uL (ref 150–400)
RBC: 2.99 MIL/uL — ABNORMAL LOW (ref 4.22–5.81)
RDW: 13.6 % (ref 11.5–15.5)
WBC: 12.6 10*3/uL — ABNORMAL HIGH (ref 4.0–10.5)

## 2014-06-24 LAB — PROCALCITONIN: Procalcitonin: 0.25 ng/mL

## 2014-06-24 LAB — LACTIC ACID, PLASMA: Lactic Acid, Venous: 2 mmol/L (ref 0.5–2.0)

## 2014-06-24 MED ORDER — METOPROLOL TARTRATE 25 MG PO TABS: 25.0000 mg | ORAL_TABLET | Freq: Three times a day (TID) | ORAL | Status: AC

## 2014-06-24 MED ORDER — LEVOFLOXACIN 500 MG PO TABS: 500.0000 mg | ORAL_TABLET | Freq: Every day | ORAL | Status: DC

## 2014-06-24 MED ORDER — RESOURCE THICKENUP CLEAR PO POWD: ORAL | Status: AC

## 2014-06-24 MED ORDER — CIPROFLOXACIN HCL 500 MG PO TABS: 500.0000 mg | ORAL_TABLET | Freq: Two times a day (BID) | ORAL | Status: DC

## 2014-06-24 MED ORDER — OXYCODONE HCL 5 MG PO TABS: 5.0000 mg | ORAL_TABLET | Freq: Once | ORAL | Status: DC

## 2014-06-24 NOTE — Progress Notes (Addendum)
Speech Language Pathology Treatment: Dysphagia  Patient Details Name: HALDON CARLEY MRN: [id number] DOB: Jan 20, 1930 Today's Date: 06/24/2014 Time: 7062-3762 SLP Time Calculation (min) (ACUTE ONLY): 11 min  Assessment / Plan / Recommendation Clinical Impression  Skilled treatment session focused on addressing dysphagia goals.  Patient recalled need for chin tuck with all sips despite, SLP discontinuing it yesterday.  Patient consumed several trials of nectar-thick liquid sips via cup with consistent use of chin tuck and no overt s/s of aspiration.  Patient unable to recall recommendations of procedures for water; water between meals after oral care.  As a result, SLP re-educated patient regarding recommendations.  Patient will require follow up education at next level of care since patient is pending discharge to SNF at this time.     HPI HPI: NERI SAMEK is a 81 y.o. male  has a past medical history of Prostate cancer (07/18/11); Diabetes mellitus; Hypertension; Hypercholesterolemia; BPH (benign prostatic hyperplasia); ED (erectile dysfunction); radiation therapy (10/23/11 - 12/18/11); and Mild dementia. He presented with 3 days of progressive fatigue and lethargy this morning as well as slurred speech. He has stopped eating but has been drinking water and Ensure. The patient has Ulcer on the left foot wife noticed it this week. The patient's wife reports subjective fever and some cough. On arrival to ER he was noted to have Left foot ulcer that seemed infected. CXR worrisome for PNA.The patient met sepsis criteria  He was given 3.5L of NS and started on Vanc and Zosyn. Currently vitals have stabilized. Patient is diabetic but has not been checking his glucose at home. He has significant dementia but is currently able to follow commands.    Pertinent Vitals Pain Assessment: No/denies pain  SLP Plan  Continue with current plan of care    Recommendations Diet recommendations: Dysphagia 3  (mechanical soft);Nectar-thick liquid Liquids provided via: Cup Medication Administration: Crushed with puree Supervision: Intermittent supervision to cue for compensatory strategies Compensations: Small sips/bites;Slow rate Postural Changes and/or Swallow Maneuvers: Seated upright 90 degrees;Chin tuck              Oral Care Recommendations: Oral care BID Follow up Recommendations: Skilled Nursing facility;24 hour supervision/assistance Plan: Continue with current plan of care    GO    Carmelia Roller., CCC-SLP [phone]  [place] 06/24/2014, 4:42 PM

## 2014-06-24 NOTE — Clinical Social Work Note (Addendum)
Patients wife chooses [facility]- bed available today  CSW will continue to follow.  Domenica Reamer, [place] Social Worker [phone]

## 2014-06-24 NOTE — Clinical Social Work Note (Signed)
Patient will discharge to [facility] Anticipated discharge date:06/24/14 Family notified: pt wife Transportation by [employer]- called at [place] signing off.  Domenica Reamer, [place] Social Worker [phone]

## 2014-06-24 NOTE — Progress Notes (Signed)
Report called to [facility] 

## 2014-06-24 NOTE — Discharge Summary (Signed)
Discharge Summary  Craig Henderson:[id number] DOB: [birth year]  PCP: Elyn Peers, MD  Admit date: 06/18/2014 Discharge date: 06/24/2014  Time spent: 35 minutes  Recommendations for Outpatient Follow-up:  1. New medication: OxyIR 5 mg every 4 hours as needed for pain 2. New medication: Levaquin 500 mg by mouth daily 3 more days 3. Premedication: Metoprolol 25 mg by mouth 3 times a day 4. Dietary adjustment: Dysphagia 3 diet with nectar thick liquids at meals and thin liquids in between meals 5. Patient is being discharged to a skilled nursing facility 6. Patient will need a repeat echocardiogram done in approximately 5-6 weeks for follow-up on pericardial effusion 7. Patient should have a repeat CBC and basic metabolic panel done Monday 3/21  Discharge Diagnoses:  Active Hospital Problems   Diagnosis Date Noted  . Blood poisoning   . Toe infection   . Pericardial effusion   . Moderate malnutrition   . Abscess, toe   . DM foot ulcers   . Diabetes type 2, uncontrolled   . Sepsis 06/18/2014  . Diabetic foot ulcer 06/18/2014  . Dementia 06/18/2014  . CAP (community acquired pneumonia) 06/18/2014  . Atrial flutter 05/11/2013  . Hypertension 01/17/2012  . Diabetes mellitus type 2, uncontrolled 01/17/2012    Resolved Hospital Problems   Diagnosis Date Noted Date Resolved  No resolved problems to display.    Discharge Condition: Improved, being discharged to skilled nursing  Diet recommendation: As above, dysphagia 3 diet with nectar thick liquids  Filed Weights   06/18/14 1642 06/19/14 0000 06/21/14 0448  Weight: 76.204 kg (168 lb) 63.368 kg (139 lb 11.2 oz) 66.815 kg (147 lb 4.8 oz)    History of present illness:  81 year old male with past mental history of prostate cancer, diabetes mellitus, hypertension and mild dementia presented to emergency room on 3/11 with 3 days of lethargy and slurred speech and decreased by mouth intake. Patient noted to have ulcer on  left foot that had been ongoing for past one week that looked infected. In addition noted to be in rapid A. fib and chest x-ray concerning for pneumonia as well as noted to have UTI.  Hospital Course:  Active Problems:  Hypertension: See below started on metoprolol  Diabetes mellitus type 2, uncontrolled  Atrial flutter: Started on chronic Xarelto last month. Elevated troponins felt to be in the setting of atrial fibrillation. Trending down. Heart rate stayed elevated so beta blocker increased as a 3/15. Heart rate improved today.  Sepsis secondary to community-acquired pneumonia: Patient met criteria given tachycardia, pneumonia source mild tachypnea and elevated pro calcitonin level and lactic acid level. Patient treated with aggressive IV fluids and antibiotics. Sepsis resolved. His white blood cell count has trended down. He will be discharged on 3 more days of by mouth Levaquin to cover both pneumonia and UTI   Dementia with no behavioral disturbance: Increasing medical issues, family feels that not able to take care of the patient. Has been accepted for skilled nursing   dysphagia: Given history of dementia, and vitamin by speech therapy will put him on dysphagia 3 diet with nectar thick liquids which she is tolerating well   DM foot ulcers: Seen by orthopedic surgery. No evidence of abscess, large blister was noted to be with loose nail. Blister resected. No evidence of osteo-myelitis by x-ray.   Diabetes type 2, uncontrolled: Manage with sliding scale here. A1c done noted only at 5.1. Continue Januvia.  UTI: Urine positive for Enterobacter, sensitive to fluoroquinolones. Levaquin  will cover both pneumonia and UTI   Pericardial effusion: Noted on x-ray. Discussed with cardiology and conservative approach managed. Repeat echocardiogram in 6-8 weeks.   Moderate malnutrition: Patient met criteria in the context of acute illness as evidenced by weight loss of 8% in the past one month  and intake that met less than 75% of estimated needs for greater than one week. Seen by nutrition and started on ensure enlive by mouth 3 times a day.   Consultants:  Cardiology  Orthopedic surgery  Procedures:  Echocardiogram done 3/12: Moderate pericardial effusion, preserved ejection fraction. PA peak pressure at 21  Discharge Exam: BP 112/68 mmHg  Pulse 80  Temp(Src) 98 F (36.7 C) (Oral)  Resp 19  Ht 5' 6"  (1.676 m)  Wt 66.815 kg (147 lb 4.8 oz)  BMI 23.79 kg/m2  SpO2 100%  General: Alert and oriented 1, no acute distress Cardiovascular: Irregular rhythm, rate controlled  Respiratory: Clear to auscultation bilaterally   Discharge Instructions You were cared for by a hospitalist during your hospital stay. If you have any questions about your discharge medications or the care you received while you were in the hospital after you are discharged, you can call the unit and asked to speak with the hospitalist on call if the hospitalist that took care of you is not available. Once you are discharged, your primary care physician will handle any further medical issues. Please note that NO REFILLS for any discharge medications will be authorized once you are discharged, as it is imperative that you return to your primary care physician (or establish a relationship with a primary care physician if you do not have one) for your aftercare needs so that they can reassess your need for medications and monitor your lab values.  Discharge Instructions    Increase activity slowly    Complete by:  As directed             Medication List    STOP taking these medications        cephALEXin 500 MG capsule  Commonly known as:  KEFLEX      TAKE these medications        atorvastatin 20 MG tablet  Commonly known as:  LIPITOR  Take 20 mg by mouth daily.     donepezil 10 MG tablet  Commonly known as:  ARICEPT  Take 1 tablet (10 mg total) by mouth at bedtime.     ENSURE  Take 237 mLs  by mouth 3 (three) times daily.     finasteride 5 MG tablet  Commonly known as:  PROSCAR  Take 1 tablet by mouth daily.     FUSION PLUS Caps  Take 1 capsule by mouth daily.     metoprolol tartrate 25 MG tablet  Commonly known as:  LOPRESSOR  Take 1 tablet (25 mg total) by mouth 3 (three) times daily.     NAMENDA XR 28 MG Cp24 24 hr capsule  Generic drug:  memantine  TAKE ONE CAPSULE BY MOUTH EVERY DAY     oxyCODONE 5 MG immediate release tablet  Commonly known as:  Oxy IR/ROXICODONE  Take 1 tablet (5 mg total) by mouth once.     RESOURCE THICKENUP CLEAR Powd  Use as directed for nectar thick liquids during meals     rivaroxaban 20 MG Tabs tablet  Commonly known as:  XARELTO  Take 20 mg by mouth daily with supper.     sitaGLIPtin 100 MG tablet  Commonly known  as:  JANUVIA  Take 100 mg by mouth daily.     tamsulosin 0.4 MG Caps capsule  Commonly known as:  FLOMAX  Take 0.8 mg by mouth at bedtime.     Travoprost (BAK Free) 0.004 % Soln ophthalmic solution  Commonly known as:  TRAVATAN  Place 1 drop into both eyes at bedtime.     traZODone 50 MG tablet  Commonly known as:  DESYREL  Take 50 mg by mouth at bedtime.       No Known Allergies    The results of significant diagnostics from this hospitalization (including imaging, microbiology, ancillary and laboratory) are listed below for reference.    Significant Diagnostic Studies: Ct Head Wo Contrast  06/19/2014   CLINICAL DATA:  81 year old male with slurred speech. Altered mental status. Three days of progressive fatigue and lethargy.  EXAM: CT HEAD WITHOUT CONTRAST  TECHNIQUE: Contiguous axial images were obtained from the base of the skull through the vertex without intravenous contrast.  COMPARISON:  03/04/2012  FINDINGS: Generalized atrophy and chronic small vessel ischemic change. 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 Small lacunar infarcts in the left basal ganglia. No intracranial hemorrhage, mass effect, or  midline shift. The basilar cisterns are patent. No evidence of territorial infarct. No intracranial fluid collection. Calvarium is intact. There is mucosal thickening of the left maxillary sinus, left mastoid air cells, and left frontal sinus, new from prior. The mastoid air cells are well aerated.  IMPRESSION: 1. Atrophy and chronic small vessel ischemic change. 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 No acute intracranial process. 2. Left-sided sinusitis, possibly acute.   Electronically Signed   By: Jeb Levering M.D.   On: 06/19/2014 02:21   Dg Chest Port 1 View  06/22/2014   CLINICAL DATA:  Pneumonia.  EXAM: PORTABLE CHEST - 1 VIEW  COMPARISON:  June 18, 2014.  FINDINGS: The heart size and mediastinal contours are within normal limits. No pneumothorax or pleural effusion is noted. No acute pulmonary disease is noted. Left lung opacities noted on prior exam have resolved. The visualized skeletal structures are unremarkable.  IMPRESSION: No acute cardiopulmonary abnormality seen.   Electronically Signed   By: Marijo Conception, M.D.   On: 06/22/2014 08:18   Dg Chest Port 1 View  06/18/2014   CLINICAL DATA:  Lethargy.  Cough for several days.  Fever.  EXAM: PORTABLE CHEST - 1 VIEW  COMPARISON:  01/25/2013  FINDINGS: Normal heart size. There is no pleural effusion identified. Left midlung and left base airspace opacities identified, worrisome for pneumonia. Right lung is clear. The visualized osseous structures appear intact.  IMPRESSION: 1. Left midlung and left base opacities worrisome for pneumonia.   Electronically Signed   By: Kerby Moors M.D.   On: 06/18/2014 17:43   Dg Foot Complete Left  06/18/2014   CLINICAL DATA:  Redness impossible infection LEFT great toe for 1 week, unable ambulate past 2 days  EXAM: LEFT FOOT - COMPLETE 3+ VIEW  COMPARISON:  None  FINDINGS: Osseous demineralization.  Soft tissue swelling great toe.  No acute fracture, dislocation or definite bone  destruction.  Joint spaces preserved.  IMPRESSION: No definite acute osseous abnormalities.   Electronically Signed   By: Lavonia Dana M.D.   On: 06/18/2014 19:59   Dg Swallowing Func-speech Pathology  06/21/2014    Objective Swallowing Evaluation:    Patient Details  Name: Craig Henderson MRN: [id number] Date of Birth: 1930-03-02  Today's Date:  06/21/2014 Time: SLP Start Time (ACUTE ONLY): 1000-SLP Stop Time (ACUTE ONLY): 1022 SLP Time Calculation (min) (ACUTE ONLY): 22 min  Past Medical History:  Past Medical History  Diagnosis Date  . Prostate cancer 07/18/11    bx=adenocarcinoma=PSA=12.45,gleason=4+5=9,4+3=7,4+4=8, 3+3=6 &  3+7,volume=156.35cc  . Diabetes mellitus   . Hypertension   . Hypercholesterolemia   . BPH (benign prostatic hyperplasia)     with nocturia,  . ED (erectile dysfunction)   . Hx of radiation therapy 10/23/11 - 12/18/11    prostate, seminal vesicles, lymph nodes  . Mild dementia    Past Surgical History:  Past Surgical History  Procedure Laterality Date  . Prostate biopsy  07/18/2011    gleason 4+5=9  . Rotator cuff repair  2011  . Fracture surgery  35 years    right leg/rod placement  . Cardioversion N/A 05/11/2013    Procedure: CARDIOVERSION;  Surgeon: Laverda Page, MD;  Location[facility]  ENDOSCOPY;  Service: Cardiovascular;  Laterality: N/A;   HPI:  HPI: DEVAUNTE GASPARINI is a 81 y.o. male  has a past medical history of  Prostate cancer (07/18/11); Diabetes mellitus; Hypertension;  Hypercholesterolemia; BPH (benign prostatic hyperplasia); ED (erectile  dysfunction); radiation therapy (10/23/11 - 12/18/11); and Mild dementia.  He presented with 3 days of progressive fatigue and lethargy this morning  as well as slurred speech. He has stopped eating but has been drinking  water and Ensure. The patient has Ulcer on the left foot wife noticed it  this week. The patient's wife reports subjective fever and some cough. On  arrival to ER he was noted to have Left foot ulcer that seemed infected.  CXR  worrisome for PNA.The patient met sepsis criteria  He was given 3.5L  of NS and started on Vanc and Zosyn. Currently vitals have stabilized.  Patient is diabetic but has not been checking his glucose at home. He has  significant dementia but is currently able to follow commands.   No Data Recorded  Assessment / Plan / Recommendation CHL IP CLINICAL IMPRESSIONS 06/21/2014  Dysphagia Diagnosis Moderate oral phase dysphagia;Moderate pharyngeal  phase dysphagia;Severe pharyngeal phase dysphagia  Clinical impression Pt demonstrates moderate oral and moderate-severe  pharyngeal phase dysphagia characterized by significantly delayed oral  transit, base of tongue weakness, reduced hyolaryngeal elevation and  delayed swallow initiation to the pyriforms. Pt with late sensation of  aspiration with trials of thin and large sips nectar thick liquids. Pt  unable to completely expell aspirate despite cough. No aspiration or  penetration observed when bolus size reduced to cup sips of nectar-thick  liquid. Chin tuck successful at reducing amount of vallecular residue. Pt  still at high risk for aspiration given base of tongue weakness, delayed  swallow initiation and reduced hyolaryngeal elevation. Recommend pt  initiate Dys 3/ nectar thick liquid diet; chin tuck; small sips; slow  rate; no straws.       CHL IP TREATMENT RECOMMENDATION 06/21/2014  Treatment Plan Recommendations Therapy as outlined in treatment plan below      CHL IP DIET RECOMMENDATION 06/21/2014  Diet Recommendations Dysphagia 3 (Mechanical Soft);Nectar-thick liquid  Liquid Administration via Cup;No straw  Medication Administration Crushed with puree  Compensations Small sips/bites;Slow rate  Postural Changes and/or Swallow Maneuvers Out of bed for meals;Chin tuck     CHL IP OTHER RECOMMENDATIONS 06/21/2014  Recommended Consults (None)  Oral Care Recommendations Oral care BID  Other Recommendations Order thickener from pharmacy;Remove water pitcher     CHL IP FOLLOW  UP RECOMMENDATIONS 06/19/2014  Follow up Recommendations (No Data)     CHL IP FREQUENCY AND DURATION 06/21/2014  Speech Therapy Frequency (ACUTE ONLY) min 2x/week  Treatment Duration 2 weeks     Pertinent Vitals/Pain NA    SLP Swallow Goals No flowsheet data found.  No flowsheet data found.    CHL IP REASON FOR REFERRAL 06/21/2014  Reason for Referral Objectively evaluate swallowing function     CHL IP ORAL PHASE 06/21/2014  Lips (None)  Tongue (None)  Mucous membranes (None)  Nutritional status (None)  Other (None)  Oxygen therapy (None)  Oral Phase Impaired  Oral - Pudding Teaspoon (None)  Oral - Pudding Cup (None)  Oral - Honey Teaspoon (None)  Oral - Honey Cup Delayed oral transit  Oral - Honey Syringe (None)  Oral - Nectar Teaspoon Delayed oral transit  Oral - Nectar Cup Delayed oral transit  Oral - Nectar Straw (None)  Oral - Nectar Syringe (None)  Oral - Ice Chips (None)  Oral - Thin Teaspoon (None)  Oral - Thin Cup Delayed oral transit  Oral - Thin Straw (None)  Oral - Thin Syringe (None)  Oral - Puree Delayed oral transit  Oral - Mechanical Soft (None)  Oral - Regular Delayed oral transit  Oral - Multi-consistency (None)  Oral - Pill (None)  Oral Phase - Comment (None)      CHL IP PHARYNGEAL PHASE 06/21/2014  Pharyngeal Phase Impaired  Pharyngeal - Pudding Teaspoon (None)  Penetration/Aspiration details (pudding teaspoon) (None)  Pharyngeal - Pudding Cup (None)  Penetration/Aspiration details (pudding cup) (None)  Pharyngeal - Honey Teaspoon (None)  Penetration/Aspiration details (honey teaspoon) (None)  Pharyngeal - Honey Cup Delayed swallow initiation;Premature spillage to  valleculae;Premature spillage to pyriform sinuses;Pharyngeal residue -  pyriform sinuses;Pharyngeal residue - valleculae;Reduced tongue base  retraction;Reduced laryngeal elevation  Penetration/Aspiration details (honey cup) (None)  Pharyngeal - Honey Syringe (None)  Penetration/Aspiration details (honey syringe) (None)  Pharyngeal - Nectar  Teaspoon Delayed swallow initiation;Premature spillage  to valleculae;Premature spillage to pyriform sinuses;Pharyngeal residue -  pyriform sinuses;Pharyngeal residue - valleculae;Reduced tongue base  retraction;Reduced laryngeal elevation  Penetration/Aspiration details (nectar teaspoon) (None)  Pharyngeal - Nectar Cup Delayed swallow initiation;Premature spillage to  valleculae;Premature spillage to pyriform sinuses;Pharyngeal residue -  pyriform sinuses;Pharyngeal residue - valleculae;Penetration/Aspiration  before swallow;Reduced tongue base retraction;Reduced laryngeal elevation  Penetration/Aspiration details (nectar cup) Material enters airway, passes  BELOW cords and not ejected out despite cough attempt by patient  Pharyngeal - Nectar Straw (None)  Penetration/Aspiration details (nectar straw) (None)  Pharyngeal - Nectar Syringe (None)  Penetration/Aspiration details (nectar syringe) (None)  Pharyngeal - Ice Chips (None)  Penetration/Aspiration details (ice chips) (None)  Pharyngeal - Thin Teaspoon (None)  Penetration/Aspiration details (thin teaspoon) (None)  Pharyngeal - Thin Cup Delayed swallow initiation;Premature spillage to  valleculae;Premature spillage to pyriform sinuses;Pharyngeal residue -  valleculae;Pharyngeal residue - pyriform sinuses;Penetration/Aspiration  before swallow;Reduced laryngeal elevation;Reduced tongue base retraction  Penetration/Aspiration details (thin cup) Material enters airway, passes  BELOW cords and not ejected out despite cough attempt by patient  Pharyngeal - Thin Straw (None)  Penetration/Aspiration details (thin straw) (None)  Pharyngeal - Thin Syringe (None)  Penetration/Aspiration details (thin syringe') (None)  Pharyngeal - Puree Delayed swallow initiation;Premature spillage to  valleculae;Premature spillage to pyriform sinuses;Pharyngeal residue -  pyriform sinuses;Pharyngeal residue - posterior pharnyx;Reduced tongue  base retraction;Reduced laryngeal elevation   Penetration/Aspiration details (puree) (None)  Pharyngeal - Mechanical Soft (None)  Penetration/Aspiration details (mechanical soft) (None)  Pharyngeal - Regular Delayed swallow  initiation;Premature spillage to  valleculae;Premature spillage to pyriform sinuses;Pharyngeal residue -  pyriform sinuses;Pharyngeal residue - posterior pharnyx;Reduced tongue  base retraction;Reduced laryngeal elevation  Penetration/Aspiration details (regular) (None)  Pharyngeal - Multi-consistency (None)  Penetration/Aspiration details (multi-consistency) (None)  Pharyngeal - Pill (None)  Penetration/Aspiration details (pill) (None)  Pharyngeal Comment (None)     CHL IP CERVICAL ESOPHAGEAL PHASE 06/21/2014  Cervical Esophageal Phase WFL  Pudding Teaspoon (None)  Pudding Cup (None)  Honey Teaspoon (None)  Honey Cup (None)  Honey Syringe (None)  Nectar Teaspoon (None)  Nectar Cup (None)  Nectar Straw (None)  Nectar Syringe (None)  Thin Teaspoon (None)  Thin Cup (None)  Thin Straw (None)  Thin Syringe (None)  Cervical Esophageal Comment (None)    No flowsheet data found. Rodena Goldmann, SLP Student Supervised and reviewed by Herbie Baltimore MA CCC-SLP         DeBlois, Katherene Ponto 06/21/2014, 12:54 PM     Microbiology: Recent Results (from the past 240 hour(s))  Culture, blood (routine x 2)     Status: None (Preliminary result)   Collection Time: 06/18/14  4:20 PM  Result Value Ref Range Status   Specimen Description BLOOD RIGHT ARM  5 ML IN [facility] BOTTLE  Final   Special Requests NONE  Final   Culture   Final           BLOOD CULTURE RECEIVED NO GROWTH TO DATE CULTURE WILL BE HELD FOR 5 DAYS BEFORE ISSUING A FINAL NEGATIVE REPORT Performed at [facility]    Report Status PENDING  Incomplete  Culture, blood (routine x 2)     Status: None (Preliminary result)   Collection Time: 06/18/14  5:52 PM  Result Value Ref Range Status   Specimen Description BLOOD LEFT ARM  5 ML IN [facility] BOTTLE  Final   Special Requests  NONE  Final   Culture   Final           BLOOD CULTURE RECEIVED NO GROWTH TO DATE CULTURE WILL BE HELD FOR 5 DAYS BEFORE ISSUING A FINAL NEGATIVE REPORT Performed at [facility]    Report Status PENDING  Incomplete  Urine culture     Status: None   Collection Time: 06/18/14  6:06 PM  Result Value Ref Range Status   Specimen Description URINE, CATHETERIZED  Final   Special Requests NONE  Final   Colony Count   Final    >=100,000 COLONIES/ML Performed at [facility]    Culture   Final    ENTEROBACTER CLOACAE Performed at [facility]    Report Status 06/21/2014 FINAL  Final   Organism ID, Bacteria ENTEROBACTER CLOACAE  Final      Susceptibility   Enterobacter cloacae - MIC*    CEFAZOLIN RESISTANT      CEFTRIAXONE <=1 SENSITIVE Sensitive     CIPROFLOXACIN <=0.25 SENSITIVE Sensitive     GENTAMICIN <=1 SENSITIVE Sensitive     LEVOFLOXACIN <=0.12 SENSITIVE Sensitive     NITROFURANTOIN <=16 SENSITIVE Sensitive     TOBRAMYCIN <=1 SENSITIVE Sensitive     TRIMETH/SULFA <=20 SENSITIVE Sensitive     PIP/TAZO <=4 SENSITIVE Sensitive     * ENTEROBACTER CLOACAE  MRSA PCR Screening     Status: None   Collection Time: 06/19/14 12:20 AM  Result Value Ref Range Status   MRSA by PCR NEGATIVE NEGATIVE Final    Comment:        The GeneXpert MRSA Assay (FDA approved for NASAL specimens  only), is one component of a comprehensive MRSA colonization surveillance program. It is not intended to diagnose MRSA infection nor to guide or monitor treatment for MRSA infections.   Clostridium Difficile by PCR     Status: None   Collection Time: 06/21/14  2:30 AM  Result Value Ref Range Status   C difficile by pcr NEGATIVE NEGATIVE Final     Labs: Basic Metabolic Panel:  Recent Labs Lab 06/19/14 0642 06/20/14 0415 06/21/14 0058 06/21/14 0525 06/22/14 0351 06/23/14 0442  NA 142 144  --  147* 146* 140  K 4.1 4.1  --  4.1 3.8 3.7  CL 112 113*  --  117* 114* 109    CO2 25 24  --  23 27 22   GLUCOSE 127* 236*  --  127* 221* 220*  BUN 14 20  --  17 18 17   CREATININE 1.16 1.43*  --  1.14 1.15 1.02  CALCIUM 8.3* 8.6  --  8.7 8.8 8.7  MG  --  2.0 1.9 1.9  --   --    Liver Function Tests:  Recent Labs Lab 06/18/14 1720 06/19/14 0642 06/20/14 0415 06/21/14 0525  AST 28 19 22  32  ALT 24 18 22  39  ALKPHOS 68 53 70 70  BILITOT 1.2 QUANTITY NOT SUFFICIENT, UNABLE TO PERFORM TEST 0.5 0.6  PROT 7.0 6.2 6.5 6.4  ALBUMIN 3.4* 2.8* 2.8* 2.8*   No results for input(s): LIPASE, AMYLASE in the last 168 hours. No results for input(s): AMMONIA in the last 168 hours. CBC:  Recent Labs Lab 06/18/14 1720 06/19/14 2836 06/20/14 0415 06/21/14 0525 06/22/14 0351 06/23/14 0442  WBC 19.7* 12.8* 12.3* 15.1* 14.5* 15.6*  NEUTROABS 17.7* 10.7* 10.8* 12.9*  --   --   HGB 11.4* 10.1* 9.8* 9.3* 9.0* 9.3*  HCT 34.0* 29.0* 28.6* 28.0* 27.1* 27.8*  MCV 95.5 93.2 93.2 94.6 94.4 95.9  PLT 198 PLATELETS APPEAR DECREASED 171 185 180 188   Cardiac Enzymes:  Recent Labs Lab 06/19/14 0222 06/19/14 0642 06/19/14 1005 06/19/14 1600 06/19/14 2300  TROPONINI 0.05* 0.04* 0.03 0.03 <0.03   BNP: BNP (last 3 results) No results for input(s): BNP in the last 8760 hours.  ProBNP (last 3 results) No results for input(s): PROBNP in the last 8760 hours.  CBG:  Recent Labs Lab 06/23/14 0656 06/23/14 1130 06/23/14 2136 06/24/14 0622 06/24/14 1130  GLUCAP 168* 154* 198* 167* 208*       Signed:  Gevena Barre K  [facility] 06/24/2014, 2:01 PM

## 2014-06-24 NOTE — Care Management Note (Signed)
    Page 1 of 1   06/24/2014     5:02:36 PM CARE MANAGEMENT NOTE 06/24/2014  Patient:  Craig Henderson, Craig Henderson   Account Number:  [id number]  Date Initiated:  06/22/2014  Documentation initiated by:  Marvetta Gibbons  Subjective/Objective Assessment:   Pt admitted with sepsis and HTN     Action/Plan:   PTA pt lived at home- per PT/OT recommendation for SNF- CSW following for possible placement   Anticipated DC Date:  06/24/2014   Anticipated DC Plan:  SKILLED NURSING FACILITY  In-house referral  Clinical Social Worker         Choice offered to / List presented to:             Status of service:  Completed, signed off Medicare Important Message given?  YES (If response is "NO", the following Medicare IM given date fields will be blank) Date Medicare IM given:  06/22/2014 Medicare IM given by:  Marvetta Gibbons Date Additional Medicare IM given:   Additional Medicare IM given by:    Discharge Disposition:  [place]  Per UR Regulation:  Reviewed for med. necessity/level of care/duration of stay  If discussed at [place] of Stay Meetings, dates discussed:   06/24/2014    Comments:

## 2014-06-25 ENCOUNTER — Non-Acute Institutional Stay (SKILLED_NURSING_FACILITY): Payer: Medicare Other | Admitting: Adult Health

## 2014-06-25 DIAGNOSIS — F0390 Unspecified dementia without behavioral disturbance: Secondary | ICD-10-CM

## 2014-06-25 DIAGNOSIS — I4892 Unspecified atrial flutter: Secondary | ICD-10-CM | POA: Diagnosis not present

## 2014-06-25 DIAGNOSIS — I10 Essential (primary) hypertension: Secondary | ICD-10-CM

## 2014-06-25 DIAGNOSIS — N400 Enlarged prostate without lower urinary tract symptoms: Secondary | ICD-10-CM | POA: Diagnosis not present

## 2014-06-25 DIAGNOSIS — J189 Pneumonia, unspecified organism: Secondary | ICD-10-CM

## 2014-06-25 DIAGNOSIS — E1165 Type 2 diabetes mellitus with hyperglycemia: Secondary | ICD-10-CM

## 2014-06-25 DIAGNOSIS — A419 Sepsis, unspecified organism: Secondary | ICD-10-CM | POA: Diagnosis not present

## 2014-06-25 DIAGNOSIS — I319 Disease of pericardium, unspecified: Secondary | ICD-10-CM

## 2014-06-25 DIAGNOSIS — IMO0002 Reserved for concepts with insufficient information to code with codable children: Secondary | ICD-10-CM

## 2014-06-25 DIAGNOSIS — I313 Pericardial effusion (noninflammatory): Secondary | ICD-10-CM

## 2014-06-25 DIAGNOSIS — I3139 Other pericardial effusion (noninflammatory): Secondary | ICD-10-CM

## 2014-06-25 LAB — CULTURE, BLOOD (ROUTINE X 2)
CULTURE: NO GROWTH
Culture: NO GROWTH

## 2014-06-27 ENCOUNTER — Other Ambulatory Visit: Payer: Self-pay | Admitting: Diagnostic Neuroimaging

## 2014-06-29 ENCOUNTER — Ambulatory Visit: Payer: Self-pay | Admitting: Podiatry

## 2014-07-07 ENCOUNTER — Other Ambulatory Visit (HOSPITAL_COMMUNITY): Payer: Self-pay | Admitting: Internal Medicine

## 2014-07-07 DIAGNOSIS — I3139 Other pericardial effusion (noninflammatory): Secondary | ICD-10-CM

## 2014-07-07 DIAGNOSIS — I313 Pericardial effusion (noninflammatory): Secondary | ICD-10-CM

## 2014-07-12 ENCOUNTER — Ambulatory Visit: Payer: Medicare Other | Admitting: Podiatry

## 2014-07-12 ENCOUNTER — Non-Acute Institutional Stay (SKILLED_NURSING_FACILITY): Payer: Medicare Other | Admitting: Internal Medicine

## 2014-07-12 DIAGNOSIS — E440 Moderate protein-calorie malnutrition: Secondary | ICD-10-CM | POA: Diagnosis not present

## 2014-07-12 DIAGNOSIS — J189 Pneumonia, unspecified organism: Secondary | ICD-10-CM | POA: Diagnosis not present

## 2014-07-12 DIAGNOSIS — I4892 Unspecified atrial flutter: Secondary | ICD-10-CM | POA: Diagnosis not present

## 2014-07-12 DIAGNOSIS — I319 Disease of pericardium, unspecified: Secondary | ICD-10-CM | POA: Diagnosis not present

## 2014-07-12 DIAGNOSIS — F0390 Unspecified dementia without behavioral disturbance: Secondary | ICD-10-CM | POA: Diagnosis not present

## 2014-07-12 DIAGNOSIS — I313 Pericardial effusion (noninflammatory): Secondary | ICD-10-CM

## 2014-07-12 DIAGNOSIS — E11628 Type 2 diabetes mellitus with other skin complications: Secondary | ICD-10-CM | POA: Diagnosis not present

## 2014-07-12 DIAGNOSIS — R269 Unspecified abnormalities of gait and mobility: Secondary | ICD-10-CM | POA: Diagnosis not present

## 2014-07-12 DIAGNOSIS — I3139 Other pericardial effusion (noninflammatory): Secondary | ICD-10-CM

## 2014-07-12 NOTE — Progress Notes (Signed)
Patient ID: Craig Henderson, male   DOB: 02-04-1930, 81 y.o.   MRN: [id number]    HISTORY AND PHYSICAL  07/01/14  Location:    [facility]   Place of Service:   SNF  Extended Emergency Contact Information Primary Emergency Contact: Sinn,Florene I Address: [address]          [place], [place] [address] [place] Phone: [phone] Mobile Phone: [phone] Relation: Spouse Secondary Emergency Contact: Linz,Stacia Address: [address]          [place], [place] [address] [place] Phone: [phone] Work Phone: [phone] Relation: Daughter  Advanced Directive information  FULL CODE  Chief Complaint  Patient presents with  . New Admit To SNF    moderate pericardial effusion, toe infection, blood poisoning, moderate malnutrition, DM (A1c 5.1%), dementia, CAP w sepsis, aflutter on xeralto, HTN, UTI    HPI:  81 yo male seen today as a new admission into SNF following hospital stay for moderate pericardial effusion, toe infection, CAP with sepsis, aflutter on xeralto, moderate malnutrition, blood poisoning , HTN, and UTI. Hospital records reviewed. He has well controlled DM with A1c 5.1%. He will need a 2D echo in 5-6 weeks to re-eval moderate pericardial effusion. He was given abx for toe infection. No c/o at this time. deneis CP/SOB. No f/c. No cough. He is a poor historian due to her dementia. Hx obtained from chart. No nursing issues. CBG 185 today. No low BS reactions   BP controlled on meds.   Past Medical History  Diagnosis Date  . Prostate cancer 07/18/11    bx=adenocarcinoma=PSA=12.45,gleason=4+5=9,4+3=7,4+4=8, 3+3=6 & 3+7,volume=156.35cc  . Diabetes mellitus   . Hypertension   . Hypercholesterolemia   . BPH (benign prostatic hyperplasia)     with nocturia,  . ED (erectile dysfunction)   . Hx of radiation therapy 10/23/11 - 12/18/11    prostate, seminal vesicles, lymph nodes  . Mild dementia       Past Surgical History  Procedure Laterality Date  . Prostate biopsy  07/18/2011    gleason 4+5=9  . Rotator cuff repair  2011  . Fracture surgery  35 years    right leg/rod placement  . Cardioversion N/A 05/11/2013    Procedure: CARDIOVERSION;  Surgeon: Laverda Page, MD;  Location: [facility];  Service: Cardiovascular;  Laterality: N/A;    Patient Care Team: Lucianne Lei, MD as PCP - General (Family Medicine)  History   Social History  . Marital Status: Married    Spouse Name: Florine  . Number of Children: 5  . Years of Education: College   Occupational History  . [employer]     retired   Social History Main Topics  . Smoking status: Former Smoker -- 1.00 packs/day for 10 years    Types: Cigarettes    Quit date: 09/04/1972  . Smokeless tobacco: Never Used     Comment: quit 20 years ago  . Alcohol Use: 0.0 oz/week     Comment: not anymore  . Drug Use: No  . Sexual Activity: Not on file   Other Topics Concern  . Not on file   Social History Narrative   Patient lives at home with spouse.   Caffeine Use: 1 soda daily     reports that he quit smoking about 41 years ago. His smoking use included Cigarettes. He has a 10 pack-year smoking history. He has never used smokeless tobacco. He reports that he  drinks alcohol. He reports that he does not use illicit drugs.  Family History  Problem Relation Age of Onset  . Pneumonia Mother   . Cancer Father    Family Status  Relation Status Death Age  . Mother Deceased 36  . Father Deceased 39     There is no immunization history on file for this patient.  No Known Allergies  Medications: Patient's Medications  New Prescriptions   CEPHALEXIN (KEFLEX) 500 MG CAPSULE    Take 1 capsule (500 mg total) by mouth 2 (two) times daily.   SILVER SULFADIAZINE (SILVADENE) 1 % CREAM    Apply 1 application topically daily.  Previous Medications   ATORVASTATIN (LIPITOR) 20 MG TABLET    Take 20 mg by mouth daily.   ENSURE  (ENSURE)    Take 237 mLs by mouth 3 (three) times daily.    FINASTERIDE (PROSCAR) 5 MG TABLET    Take 1 tablet by mouth daily.   IRON-FA-B CMP-C-BIOT-PROBIOTIC (FUSION PLUS) CAPS    Take 1 capsule by mouth daily.   LEVOFLOXACIN (LEVAQUIN) 500 MG TABLET    Take 1 tablet (500 mg total) by mouth daily.   MALTODEXTRIN-XANTHAN GUM (RESOURCE THICKENUP CLEAR) POWD    Use as directed for nectar thick liquids during meals   METOPROLOL TARTRATE (LOPRESSOR) 25 MG TABLET    Take 1 tablet (25 mg total) by mouth 3 (three) times daily.   NAMENDA XR 28 MG CP24 24 HR CAPSULE    TAKE ONE CAPSULE BY MOUTH EVERY DAY--NEEDS APPT   OXYCODONE (OXY IR/ROXICODONE) 5 MG IMMEDIATE RELEASE TABLET    Take 1 tablet (5 mg total) by mouth once.   RIVAROXABAN (XARELTO) 20 MG TABS TABLET    Take 20 mg by mouth daily with supper.   SITAGLIPTIN (JANUVIA) 100 MG TABLET    Take 100 mg by mouth daily.   TAMSULOSIN (FLOMAX) 0.4 MG CAPS CAPSULE    Take 0.8 mg by mouth at bedtime.   TRAVOPROST, BAK FREE, (TRAVATAN) 0.004 % SOLN OPHTHALMIC SOLUTION    Place 1 drop into both eyes at bedtime.   TRAZODONE (DESYREL) 50 MG TABLET    Take 50 mg by mouth at bedtime.  Modified Medications   Modified Medication Previous Medication   DONEPEZIL (ARICEPT) 10 MG TABLET donepezil (ARICEPT) 10 MG tablet      TAKE 1 TABLET BY MOUTH EVERY DAY AT BEDTIME    Take 1 tablet (10 mg total) by mouth at bedtime.  Discontinued Medications   No medications on file    Review of Systems  Unable to perform ROS: Dementia    Filed Vitals:   07/01/14 1633  BP: 88/55  Pulse: 77  Temp: 97.5 F (36.4 C)  Weight: 152 lb (68.947 kg)  SpO2: 95%   Body mass index is 24.55 kg/(m^2).  Physical Exam  Constitutional: He appears well-developed. No distress.  Frail appearing in NAD.  HENT:  Mouth/Throat: Oropharynx is clear and moist.  Eyes: Pupils are equal, round, and reactive to light. No scleral icterus.  Neck: Neck supple. No thyromegaly present.    Cardiovascular: Normal rate, regular rhythm and intact distal pulses.  Exam reveals friction rub. Exam reveals no gallop.   No murmur heard. No carotid bruit b/l; +1 pitting distal LE swelling; no calf TTP  Pulmonary/Chest: Effort normal and breath sounds normal. He has no wheezes. He has no rales. He exhibits no tenderness.  Abdominal: Soft. Bowel sounds are normal. He exhibits no distension, no abdominal  bruit, no pulsatile midline mass and no mass. There is no tenderness. There is no rebound and no guarding.  Lymphadenopathy:    He has no cervical adenopathy.  Neurological: He is alert. He has normal reflexes.  Skin: Skin is warm and dry. No rash noted. There is erythema (improved redness in legs).  Psychiatric: He has a normal mood and affect. His behavior is normal.     Labs reviewed: Admission on 06/18/2014, Discharged on 06/24/2014  No results displayed because visit has over 200 results.     CBC Latest Ref Rng 06/24/2014 06/23/2014 06/22/2014  WBC 4.0 - 10.5 K/uL 12.6(H) 15.6(H) 14.5(H)  Hemoglobin 13.0 - 17.0 g/dL 9.4(L) 9.3(L) 9.0(L)  Hematocrit 39.0 - 52.0 % 28.0(L) 27.8(L) 27.1(L)  Platelets 150 - 400 K/uL 219 188 180    CMP Latest Ref Rng 06/23/2014 06/22/2014 06/21/2014  Glucose 70 - 99 mg/dL 220(H) 221(H) 127(H)  BUN 6 - 23 mg/dL _0 Creatinine 0.50 - 1.35 mg/dL 1.02 1.15 1.14  Sodium 135 - 145 mmol/L 140 146(H) 147(H)  Potassium 3.5 - 5.1 mmol/L 3.7 3.8 4.1  Chloride 96 - 112 mmol/L 109 114(H) 117(H)  CO2 19 - 32 mmol/L _1 Calcium 8.4 - 10.5 mg/dL 8.7 8.8 8.7  Total Protein 6.0 - 8.3 g/dL - - 6.4  Total Bilirubin 0.3 - 1.2 mg/dL - - 0.6  Alkaline Phos 39 - 117 U/L - - 70  AST 0 - 37 U/L - - 32  ALT 0 - 53 U/L - - 39      Ct Head Wo Contrast  06/19/2014   CLINICAL DATA:  81 year old male with slurred speech. Altered mental status. Three days of progressive fatigue and lethargy.  EXAM: CT HEAD WITHOUT CONTRAST  TECHNIQUE: Contiguous axial images were  obtained from the base of the skull through the vertex without intravenous contrast.  COMPARISON:  03/04/2012  FINDINGS: Generalized atrophy and chronic small vessel ischemic change. 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 Small lacunar infarcts in the left basal ganglia. No intracranial hemorrhage, mass effect, or midline shift. The basilar cisterns are patent. No evidence of territorial infarct. No intracranial fluid collection. Calvarium is intact. There is mucosal thickening of the left maxillary sinus, left mastoid air cells, and left frontal sinus, new from prior. The mastoid air cells are well aerated.  IMPRESSION: 1. Atrophy and chronic small vessel ischemic change. 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 No acute intracranial process. 2. Left-sided sinusitis, possibly acute.   Electronically Signed   By: Jeb Levering M.D.   On: 06/19/2014 02:21   Dg Chest Port 1 View  06/22/2014   CLINICAL DATA:  Pneumonia.  EXAM: PORTABLE CHEST - 1 VIEW  COMPARISON:  June 18, 2014.  FINDINGS: The heart size and mediastinal contours are within normal limits. No pneumothorax or pleural effusion is noted. No acute pulmonary disease is noted. Left lung opacities noted on prior exam have resolved. The visualized skeletal structures are unremarkable.  IMPRESSION: No acute cardiopulmonary abnormality seen.   Electronically Signed   By: Marijo Conception, M.D.   On: 06/22/2014 08:18   Dg Chest Port 1 View  06/18/2014   CLINICAL DATA:  Lethargy.  Cough for several days.  Fever.  EXAM: PORTABLE CHEST - 1 VIEW  COMPARISON:  01/25/2013  FINDINGS: Normal heart size. There is no pleural effusion identified. Left midlung and left base airspace opacities identified, worrisome for pneumonia. Right lung is clear.  The visualized osseous structures appear intact.  IMPRESSION: 1. Left midlung and left base opacities worrisome for pneumonia.   Electronically Signed   By: Kerby Moors M.D.   On: 06/18/2014  17:43   Dg Foot Complete Left  06/18/2014   CLINICAL DATA:  Redness impossible infection LEFT great toe for 1 week, unable ambulate past 2 days  EXAM: LEFT FOOT - COMPLETE 3+ VIEW  COMPARISON:  None  FINDINGS: Osseous demineralization.  Soft tissue swelling great toe.  No acute fracture, dislocation or definite bone destruction.  Joint spaces preserved.  IMPRESSION: No definite acute osseous abnormalities.   Electronically Signed   By: Lavonia Dana M.D.   On: 06/18/2014 19:59   Dg Swallowing Func-speech Pathology  06/21/2014    Objective Swallowing Evaluation:    Patient Details  Name: JULES BATY MRN: [id number] Date of Birth: 1929/08/02  Today's Date: 06/21/2014 Time: SLP Start Time (ACUTE ONLY): 1000-SLP Stop Time (ACUTE ONLY): 1022 SLP Time Calculation (min) (ACUTE ONLY): 22 min  Past Medical History:  Past Medical History  Diagnosis Date  . Prostate cancer 07/18/11    bx=adenocarcinoma=PSA=12.45,gleason=4+5=9,4+3=7,4+4=8, 3+3=6 &  3+7,volume=156.35cc  . Diabetes mellitus   . Hypertension   . Hypercholesterolemia   . BPH (benign prostatic hyperplasia)     with nocturia,  . ED (erectile dysfunction)   . Hx of radiation therapy 10/23/11 - 12/18/11    prostate, seminal vesicles, lymph nodes  . Mild dementia    Past Surgical History:  Past Surgical History  Procedure Laterality Date  . Prostate biopsy  07/18/2011    gleason 4+5=9  . Rotator cuff repair  2011  . Fracture surgery  35 years    right leg/rod placement  . Cardioversion N/A 05/11/2013    Procedure: CARDIOVERSION;  Surgeon: Laverda Page, MD;  Location: [facility]  ENDOSCOPY;  Service: Cardiovascular;  Laterality: N/A;   HPI:  HPI: BAINE DECESARE is a 81 y.o. male  has a past medical history of  Prostate cancer (07/18/11); Diabetes mellitus; Hypertension;  Hypercholesterolemia; BPH (benign prostatic hyperplasia); ED (erectile  dysfunction); radiation therapy (10/23/11 - 12/18/11); and Mild dementia.  He presented with 3 days of progressive fatigue and  lethargy this morning  as well as slurred speech. He has stopped eating but has been drinking  water and Ensure. The patient has Ulcer on the left foot wife noticed it  this week. The patient's wife reports subjective fever and some cough. On  arrival to ER he was noted to have Left foot ulcer that seemed infected.  CXR worrisome for PNA.The patient met sepsis criteria  He was given 3.5L  of NS and started on Vanc and Zosyn. Currently vitals have stabilized.  Patient is diabetic but has not been checking his glucose at home. He has  significant dementia but is currently able to follow commands.   No Data Recorded  Assessment / Plan / Recommendation CHL IP CLINICAL IMPRESSIONS 06/21/2014  Dysphagia Diagnosis Moderate oral phase dysphagia;Moderate pharyngeal  phase dysphagia;Severe pharyngeal phase dysphagia  Clinical impression Pt demonstrates moderate oral and moderate-severe  pharyngeal phase dysphagia characterized by significantly delayed oral  transit, base of tongue weakness, reduced hyolaryngeal elevation and  delayed swallow initiation to the pyriforms. Pt with late sensation of  aspiration with trials of thin and large sips nectar thick liquids. Pt  unable to completely expell aspirate despite cough. No aspiration or  penetration observed when bolus size reduced to cup sips of nectar-thick  liquid. Chin tuck  successful at reducing amount of vallecular residue. Pt  still at high risk for aspiration given base of tongue weakness, delayed  swallow initiation and reduced hyolaryngeal elevation. Recommend pt  initiate Dys 3/ nectar thick liquid diet; chin tuck; small sips; slow  rate; no straws.       CHL IP TREATMENT RECOMMENDATION 06/21/2014  Treatment Plan Recommendations Therapy as outlined in treatment plan below      CHL IP DIET RECOMMENDATION 06/21/2014  Diet Recommendations Dysphagia 3 (Mechanical Soft);Nectar-thick liquid  Liquid Administration via Cup;No straw  Medication Administration Crushed with puree   Compensations Small sips/bites;Slow rate  Postural Changes and/or Swallow Maneuvers Out of bed for meals;Chin tuck     CHL IP OTHER RECOMMENDATIONS 06/21/2014  Recommended Consults (None)  Oral Care Recommendations Oral care BID  Other Recommendations Order thickener from pharmacy;Remove water pitcher     CHL IP FOLLOW UP RECOMMENDATIONS 06/19/2014  Follow up Recommendations (No Data)     CHL IP FREQUENCY AND DURATION 06/21/2014  Speech Therapy Frequency (ACUTE ONLY) min 2x/week  Treatment Duration 2 weeks     Pertinent Vitals/Pain NA    SLP Swallow Goals No flowsheet data found.  No flowsheet data found.    CHL IP REASON FOR REFERRAL 06/21/2014  Reason for Referral Objectively evaluate swallowing function     CHL IP ORAL PHASE 06/21/2014  Lips (None)  Tongue (None)  Mucous membranes (None)  Nutritional status (None)  Other (None)  Oxygen therapy (None)  Oral Phase Impaired  Oral - Pudding Teaspoon (None)  Oral - Pudding Cup (None)  Oral - Honey Teaspoon (None)  Oral - Honey Cup Delayed oral transit  Oral - Honey Syringe (None)  Oral - Nectar Teaspoon Delayed oral transit  Oral - Nectar Cup Delayed oral transit  Oral - Nectar Straw (None)  Oral - Nectar Syringe (None)  Oral - Ice Chips (None)  Oral - Thin Teaspoon (None)  Oral - Thin Cup Delayed oral transit  Oral - Thin Straw (None)  Oral - Thin Syringe (None)  Oral - Puree Delayed oral transit  Oral - Mechanical Soft (None)  Oral - Regular Delayed oral transit  Oral - Multi-consistency (None)  Oral - Pill (None)  Oral Phase - Comment (None)      CHL IP PHARYNGEAL PHASE 06/21/2014  Pharyngeal Phase Impaired  Pharyngeal - Pudding Teaspoon (None)  Penetration/Aspiration details (pudding teaspoon) (None)  Pharyngeal - Pudding Cup (None)  Penetration/Aspiration details (pudding cup) (None)  Pharyngeal - Honey Teaspoon (None)  Penetration/Aspiration details (honey teaspoon) (None)  Pharyngeal - Honey Cup Delayed swallow initiation;Premature spillage to  valleculae;Premature  spillage to pyriform sinuses;Pharyngeal residue -  pyriform sinuses;Pharyngeal residue - valleculae;Reduced tongue base  retraction;Reduced laryngeal elevation  Penetration/Aspiration details (honey cup) (None)  Pharyngeal - Honey Syringe (None)  Penetration/Aspiration details (honey syringe) (None)  Pharyngeal - Nectar Teaspoon Delayed swallow initiation;Premature spillage  to valleculae;Premature spillage to pyriform sinuses;Pharyngeal residue -  pyriform sinuses;Pharyngeal residue - valleculae;Reduced tongue base  retraction;Reduced laryngeal elevation  Penetration/Aspiration details (nectar teaspoon) (None)  Pharyngeal - Nectar Cup Delayed swallow initiation;Premature spillage to  valleculae;Premature spillage to pyriform sinuses;Pharyngeal residue -  pyriform sinuses;Pharyngeal residue - valleculae;Penetration/Aspiration  before swallow;Reduced tongue base retraction;Reduced laryngeal elevation  Penetration/Aspiration details (nectar cup) Material enters airway, passes  BELOW cords and not ejected out despite cough attempt by patient  Pharyngeal - Nectar Straw (None)  Penetration/Aspiration details (nectar straw) (None)  Pharyngeal - Nectar Syringe (None)  Penetration/Aspiration details (nectar syringe) (None)  Pharyngeal -  Ice Chips (None)  Penetration/Aspiration details (ice chips) (None)  Pharyngeal - Thin Teaspoon (None)  Penetration/Aspiration details (thin teaspoon) (None)  Pharyngeal - Thin Cup Delayed swallow initiation;Premature spillage to  valleculae;Premature spillage to pyriform sinuses;Pharyngeal residue -  valleculae;Pharyngeal residue - pyriform sinuses;Penetration/Aspiration  before swallow;Reduced laryngeal elevation;Reduced tongue base retraction  Penetration/Aspiration details (thin cup) Material enters airway, passes  BELOW cords and not ejected out despite cough attempt by patient  Pharyngeal - Thin Straw (None)  Penetration/Aspiration details (thin straw) (None)  Pharyngeal - Thin Syringe  (None)  Penetration/Aspiration details (thin syringe') (None)  Pharyngeal - Puree Delayed swallow initiation;Premature spillage to  valleculae;Premature spillage to pyriform sinuses;Pharyngeal residue -  pyriform sinuses;Pharyngeal residue - posterior pharnyx;Reduced tongue  base retraction;Reduced laryngeal elevation  Penetration/Aspiration details (puree) (None)  Pharyngeal - Mechanical Soft (None)  Penetration/Aspiration details (mechanical soft) (None)  Pharyngeal - Regular Delayed swallow initiation;Premature spillage to  valleculae;Premature spillage to pyriform sinuses;Pharyngeal residue -  pyriform sinuses;Pharyngeal residue - posterior pharnyx;Reduced tongue  base retraction;Reduced laryngeal elevation  Penetration/Aspiration details (regular) (None)  Pharyngeal - Multi-consistency (None)  Penetration/Aspiration details (multi-consistency) (None)  Pharyngeal - Pill (None)  Penetration/Aspiration details (pill) (None)  Pharyngeal Comment (None)     CHL IP CERVICAL ESOPHAGEAL PHASE 06/21/2014  Cervical Esophageal Phase WFL  Pudding Teaspoon (None)  Pudding Cup (None)  Honey Teaspoon (None)  Honey Cup (None)  Honey Syringe (None)  Nectar Teaspoon (None)  Nectar Cup (None)  Nectar Straw (None)  Nectar Syringe (None)  Thin Teaspoon (None)  Thin Cup (None)  Thin Straw (None)  Thin Syringe (None)  Cervical Esophageal Comment (None)    No flowsheet data found. Rodena Goldmann, SLP Student Supervised and reviewed by Herbie Baltimore MA CCC-SLP         DeBlois, Katherene Ponto 06/21/2014, 12:54 PM      Assessment/Plan   ICD-9-CM ICD-10-CM   1. Pericardial effusion - moderate 423.9 I31.9   2. Moderate malnutrition - cont nutritional supplements 263.0 E44.0   3. CAP (community acquired pneumonia) - treated 52 J18.9   4. Dementia, without behavioral disturbance - cont aricept and namenda 294.20 F03.90   5. Atrial flutter, unspecified - rate controlled; cont xeralto 427.32 I48.92   6. Gait difficulty  781.2  R26.9   7.      Diabetes Mellitus II - well controlled; cont meds  --continue PT/OT/ST as indicated  --repeat 2Decho [place] in 5-6 weeks to re-eval pericardial effusion  --continue current medications as ordered  --will follow  --GOAL: short term rehab and d/c home when medically appropriate. Communicated with pt and nursing.  Meldrick Buttery S. Perlie Gold  [facility] [address] [place], [place] [address] [phone] Office (Wednesdays and Fridays 8 AM - 5 PM) [phone] Cell (Monday-Friday 8 AM - 5 PM)

## 2014-07-13 ENCOUNTER — Ambulatory Visit (HOSPITAL_COMMUNITY): Payer: Medicare Other

## 2014-07-16 ENCOUNTER — Other Ambulatory Visit (HOSPITAL_COMMUNITY): Payer: Self-pay | Admitting: Internal Medicine

## 2014-07-16 DIAGNOSIS — J690 Pneumonitis due to inhalation of food and vomit: Secondary | ICD-10-CM

## 2014-07-21 ENCOUNTER — Ambulatory Visit (HOSPITAL_COMMUNITY)
Admission: RE | Admit: 2014-07-21 | Discharge: 2014-07-21 | Disposition: A | Discharge status: Home / Self Care | Payer: Medicare Other | Source: Ambulatory Visit | Attending: Internal Medicine | Admitting: Internal Medicine

## 2014-07-21 DIAGNOSIS — R1310 Dysphagia, unspecified: Secondary | ICD-10-CM | POA: Insufficient documentation

## 2014-07-21 DIAGNOSIS — J690 Pneumonitis due to inhalation of food and vomit: Secondary | ICD-10-CM

## 2014-07-21 NOTE — Procedures (Signed)
Objective Swallowing Evaluation: Modified Barium Swallowing Study  Patient Details  Name: Craig Henderson MRN: [id number] Date of Birth: 08-12-1929  Today's Date: 07/21/2014 Time: SLP Start Time (ACUTE ONLY): 1326-SLP Stop Time (ACUTE ONLY): 1420 SLP Time Calculation (min) (ACUTE ONLY): 54 min  Past Medical History:  Past Medical History  Diagnosis Date  . Prostate cancer 07/18/11    bx=adenocarcinoma=PSA=12.45,gleason=4+5=9,4+3=7,4+4=8, 3+3=6 & 3+7,volume=156.35cc  . Diabetes mellitus   . Hypertension   . Hypercholesterolemia   . BPH (benign prostatic hyperplasia)     with nocturia,  . ED (erectile dysfunction)   . Hx of radiation therapy 10/23/11 - 12/18/11    prostate, seminal vesicles, lymph nodes  . Mild dementia    Past Surgical History:  Past Surgical History  Procedure Laterality Date  . Prostate biopsy  07/18/2011    gleason 4+5=9  . Rotator cuff repair  2011  . Fracture surgery  35 years    right leg/rod placement  . Cardioversion N/A 05/11/2013    Procedure: CARDIOVERSION;  Surgeon: Laverda Page, MD;  Location: [facility] ENDOSCOPY;  Service: Cardiovascular;  Laterality: N/A;   HPI:  HPI: 81 yo male referred for OP MBS-PMH + for prostate cancer, recent aspiration pna with hospital admit in March 2016 and dementia.  Pt at SNF for rehab at this time.  Spouse presents with pt for MBS. Pt admits to coughing with liquids more than foods and he reports more on thin than thickened.  Spouse states pt has had a baseline cough since March 2016 with no improvement or worsening.    No Data Recorded  Assessment / Plan / Recommendation CHL IP CLINICAL IMPRESSIONS 07/21/2014  Dysphagia Diagnosis Moderate oropharyngeal dysphagia  Clinical impression Pt demonstrates moderate improvement in swallow ability currently demonstrating moderate oropharyngeal dysphagia.   Ongoing delayed oral transit and base of tongue weakness noted resulting in pharyngeal residuals (most notably vallecular  region WITHOUT pt awareness.    At times, pt with nearly absent swallow response/very minimal muscular contraction  *x3 during testing* with dry swallows.  No aspiration noted during today's testing and trace laryngeal penetration of thin cleared with cued throat clear/cough. Chin tuck posture not helpful to decrease amount of vallecular residuals size reduced to cup sips of nectar-thick liquid.     Pt educated to findings during [place] and spouse after MBS.  Recommend consider diet advancement with strict precautions.  Also recommend strengthening pt's ability to expectorate vallecular residue he can not clear with swallows.       CHL IP TREATMENT RECOMMENDATION 07/21/2014  Treatment Plan Recommendations Defer treatment plan to SLP at (Comment)     CHL IP DIET RECOMMENDATION 07/21/2014  Diet Recommendations Dysphagia 3 (Mechanical Soft)  Liquid Administration via Cup  Medication Administration Whole meds with puree  Compensations Slow rate;Small sips/bites;Multiple dry swallows after each bite/sip;Follow solids with liquid;Clear throat intermittently  Postural Changes and/or Swallow Maneuvers Seated upright 90 degrees;Upright 30-60 min after meal     CHL IP OTHER RECOMMENDATIONS 07/21/2014  Recommended Consults (None)  Oral Care Recommendations Oral care BID  Other Recommendations (None)     CHL IP FOLLOW UP RECOMMENDATIONS 07/21/2014  Follow up Recommendations Skilled Nursing facility     [facility] IP FREQUENCY AND DURATION 06/21/2014  Speech Therapy Frequency (ACUTE ONLY) min 2x/week  Treatment Duration 2 weeks      CHL IP REASON FOR REFERRAL 07/21/2014  Reason for Referral Objectively evaluate swallowing function     CHL IP ORAL PHASE 07/21/2014  Oral Phase  Impaired  Oral - Nectar Teaspoon Weak lingual manipulation;Delayed oral transit;Lingual pumping;Reduced posterior propulsion  Oral - Nectar Cup Weak lingual manipulation;Delayed oral transit;Lingual pumping;Reduced posterior propulsion   Oral - Thin Teaspoon Weak lingual manipulation;Delayed oral transit;Lingual pumping;Reduced posterior propulsion  Oral - Thin Cup Weak lingual manipulation;Delayed oral transit;Lingual pumping;Reduced posterior propulsion  Oral - Thin Straw (None)  Oral - Thin Syringe (None)  Oral - Puree Weak lingual manipulation;Delayed oral transit;Lingual pumping;Reduced posterior propulsion  Oral - Mechanical Soft Weak lingual manipulation;Delayed oral transit;Impaired mastication;Lingual pumping;Reduced posterior propulsion      CHL IP PHARYNGEAL PHASE 07/21/2014  Pharyngeal Phase Impaired  Pharyngeal - Nectar Teaspoon Reduced pharyngeal peristalsis;Reduced epiglottic inversion;Reduced laryngeal elevation;Reduced airway/laryngeal closure;Pharyngeal residue - valleculae;Reduced tongue base retraction;Premature spillage to valleculae  Penetration/Aspiration details (nectar teaspoon) (None)  Pharyngeal - Nectar Cup Pharyngeal residue - valleculae;Reduced epiglottic inversion;Reduced pharyngeal peristalsis;Reduced tongue base retraction;Reduced laryngeal elevation;Reduced airway/laryngeal closure;Premature spillage to valleculae;Pharyngeal residue - pyriform sinuses  Pharyngeal - Thin Teaspoon Reduced laryngeal elevation;Reduced airway/laryngeal closure;Pharyngeal residue - valleculae;Reduced epiglottic inversion;Reduced pharyngeal peristalsis;Reduced tongue base retraction;Premature spillage to valleculae  Penetration/Aspiration details (thin teaspoon) (None)  Pharyngeal - Thin Cup Pharyngeal residue - valleculae;Reduced pharyngeal peristalsis;Reduced epiglottic inversion;Reduced laryngeal elevation;Reduced airway/laryngeal closure;Premature spillage to pyriform sinuses  Pharyngeal - Puree Reduced airway/laryngeal closure;Pharyngeal residue - valleculae;Pharyngeal residue - pyriform sinuses;Reduced epiglottic inversion;Reduced pharyngeal peristalsis;Reduced laryngeal elevation;Premature spillage to valleculae   Penetration/Aspiration details (puree) (None)  Pharyngeal - Mechanical Soft Pharyngeal residue - valleculae;Reduced airway/laryngeal closure;Premature spillage to valleculae;Reduced epiglottic inversion;Reduced pharyngeal peristalsis  Pharyngeal Comment multple swallows *cued* required to decrease residuals-chin tuck not helpful during today's exam, pyriform sinus residuals with nectar appeared mixed with secretions as it was a lighter shade     CHL IP CERVICAL ESOPHAGEAL PHASE 07/21/2014  Cervical Esophageal Phase WFL    CHL IP GO 07/21/2014  Functional Assessment Tool Used mbs, clincial judgement  Functional Limitations Swallowing  Swallow Current Status (P7943) CJ  Swallow Goal Status (E7614) [place] Discharge Status [phone]) Michaell Cowing, [place] [facility] SLP [phone]

## 2014-07-27 ENCOUNTER — Encounter: Payer: Self-pay | Admitting: Internal Medicine

## 2014-07-27 ENCOUNTER — Non-Acute Institutional Stay (SKILLED_NURSING_FACILITY): Payer: Medicare Other | Admitting: Internal Medicine

## 2014-07-27 DIAGNOSIS — I3139 Other pericardial effusion (noninflammatory): Secondary | ICD-10-CM

## 2014-07-27 DIAGNOSIS — I319 Disease of pericardium, unspecified: Secondary | ICD-10-CM

## 2014-07-27 DIAGNOSIS — I313 Pericardial effusion (noninflammatory): Secondary | ICD-10-CM

## 2014-07-27 DIAGNOSIS — E440 Moderate protein-calorie malnutrition: Secondary | ICD-10-CM | POA: Diagnosis not present

## 2014-07-27 DIAGNOSIS — E11628 Type 2 diabetes mellitus with other skin complications: Secondary | ICD-10-CM

## 2014-07-27 DIAGNOSIS — Z8546 Personal history of malignant neoplasm of prostate: Secondary | ICD-10-CM | POA: Diagnosis not present

## 2014-07-27 DIAGNOSIS — I10 Essential (primary) hypertension: Secondary | ICD-10-CM

## 2014-07-27 DIAGNOSIS — R269 Unspecified abnormalities of gait and mobility: Secondary | ICD-10-CM

## 2014-07-27 DIAGNOSIS — F0390 Unspecified dementia without behavioral disturbance: Secondary | ICD-10-CM

## 2014-07-27 DIAGNOSIS — L089 Local infection of the skin and subcutaneous tissue, unspecified: Secondary | ICD-10-CM | POA: Diagnosis not present

## 2014-07-27 DIAGNOSIS — J189 Pneumonia, unspecified organism: Secondary | ICD-10-CM

## 2014-07-27 DIAGNOSIS — I4892 Unspecified atrial flutter: Secondary | ICD-10-CM | POA: Diagnosis not present

## 2014-07-27 NOTE — Progress Notes (Signed)
Patient ID: Craig Henderson, male   DOB: 23-Nov-1929, 81 y.o.   MRN: [id number]    [place] Point of Service: SNF [phone])  07/27/14  No Known Allergies  Chief Complaint  Patient presents with  . Discharge Note    HPI:  81 yo male seen today for d/c from SNF following rehab for deconditioning. He has dx of moderate pericardial effusion, toe infection, moderate malnutrition, aflutter on xeralto and CAP with sepsis. He will need HH with RN (med mx), PT (strength/balance)/OT(ADL training at home) and CNA (bathing).  He has no c/o today. Appetite is good and has no trouble sleeping. No falls. No nursing issues  CODE STATUS: FULL  Past Medical History  Diagnosis Date  . Prostate cancer 07/18/11    bx=adenocarcinoma=PSA=12.45,gleason=4+5=9,4+3=7,4+4=8, 3+3=6 & 3+7,volume=156.35cc  . Diabetes mellitus   . Hypertension   . Hypercholesterolemia   . BPH (benign prostatic hyperplasia)     with nocturia,  . ED (erectile dysfunction)   . Hx of radiation therapy 10/23/11 - 12/18/11    prostate, seminal vesicles, lymph nodes  . Mild dementia    Past Surgical History  Procedure Laterality Date  . Prostate biopsy  07/18/2011    gleason 4+5=9  . Rotator cuff repair  2011  . Fracture surgery  35 years    right leg/rod placement  . Cardioversion N/A 05/11/2013    Procedure: CARDIOVERSION;  Surgeon: Laverda Page, MD;  Location: [place];  Service: Cardiovascular;  Laterality: N/A;   History   Social History  . Marital Status: Married    Spouse Name: Florine  . Number of Children: 5  . Years of Education: College   Occupational History  . [employer]     retired   Social History Main Topics  . Smoking status: Former Smoker -- 1.00 packs/day for 10 years    Types: Cigarettes    Quit date: 09/04/1972  . Smokeless tobacco: Never Used     Comment: quit 20 years ago  . Alcohol Use: 0.0 oz/week     Comment: not anymore  . Drug Use: No  . Sexual  Activity: Not on file   Other Topics Concern  . None   Social History Narrative   Patient lives at home with spouse.   Caffeine Use: 1 soda daily    Medications: Patient's Medications  New Prescriptions   No medications on file  Previous Medications   ATORVASTATIN (LIPITOR) 20 MG TABLET    Take 20 mg by mouth daily.   DONEPEZIL (ARICEPT) 10 MG TABLET    Take 1 tablet (10 mg total) by mouth at bedtime.   ENSURE (ENSURE)    Take 237 mLs by mouth 3 (three) times daily.    FINASTERIDE (PROSCAR) 5 MG TABLET    Take 1 tablet by mouth daily.   IRON-FA-B CMP-C-BIOT-PROBIOTIC (FUSION PLUS) CAPS    Take 1 capsule by mouth daily.   LEVOFLOXACIN (LEVAQUIN) 500 MG TABLET    Take 1 tablet (500 mg total) by mouth daily.   MALTODEXTRIN-XANTHAN GUM (RESOURCE THICKENUP CLEAR) POWD    Use as directed for nectar thick liquids during meals   METOPROLOL TARTRATE (LOPRESSOR) 25 MG TABLET    Take 1 tablet (25 mg total) by mouth 3 (three) times daily.   NAMENDA XR 28 MG CP24 24 HR CAPSULE    TAKE ONE CAPSULE BY MOUTH EVERY DAY--NEEDS APPT   OXYCODONE (OXY IR/ROXICODONE) 5 MG IMMEDIATE RELEASE TABLET  Take 1 tablet (5 mg total) by mouth once.   RIVAROXABAN (XARELTO) 20 MG TABS TABLET    Take 20 mg by mouth daily with supper.   SITAGLIPTIN (JANUVIA) 100 MG TABLET    Take 100 mg by mouth daily.   TAMSULOSIN (FLOMAX) 0.4 MG CAPS CAPSULE    Take 0.8 mg by mouth at bedtime.   TRAVOPROST, BAK FREE, (TRAVATAN) 0.004 % SOLN OPHTHALMIC SOLUTION    Place 1 drop into both eyes at bedtime.   TRAZODONE (DESYREL) 50 MG TABLET    Take 50 mg by mouth at bedtime.  Modified Medications   No medications on file  Discontinued Medications   No medications on file     Review of Systems  Unable to perform ROS: Dementia    Filed Vitals:   07/27/14 1606  BP: 148/55  Pulse: 69  Temp: 98.2 F (36.8 C)  Weight: 152 lb (68.947 kg)  SpO2: 95%   Body mass index is 24.55 kg/(m^2).  Physical Exam  Constitutional: He  appears well-developed.  Frail appearing in NAD  HENT:  Mouth/Throat: Oropharynx is clear and moist.  Eyes: Pupils are equal, round, and reactive to light. No scleral icterus.  Cardiovascular: Normal rate, regular rhythm and intact distal pulses.  Exam reveals friction rub. Exam reveals no gallop.   Murmur (1/6 SEM) heard. No distal LE edema. No calf TTP  Pulmonary/Chest: Effort normal and breath sounds normal. No respiratory distress. He has no wheezes. He has no rales.  Neurological: He is alert.  Skin: Skin is warm and dry. No rash noted.  Psychiatric: He has a normal mood and affect. His behavior is normal.     Labs reviewed: Admission on 06/18/2014, Discharged on 06/24/2014  No results displayed because visit has over 200 results.       Assessment/Plan   ICD-9-CM ICD-10-CM   1. Pericardial effusion - moderate; repeat 2D echo as scheduled; f/u with cardio 423.9 I31.9   2. Dementia, without behavioral disturbance - stable; cont meds 294.20 F03.90   3. Moderate malnutrition 263.0 E44.0   4. Atrial flutter, unspecified - rate controlled 427.32 I48.92   5. Gait difficulty  781.2 R26.9   6. CAP (community acquired pneumonia) - resolved 486 J18.9   7. Essential hypertension - cont meds 401.9 I10   8. Toe infection - current tx underway; f/u with podiatry 686.9 L08.9   9. History of prostate cancer V10.46 Z85.46   10. 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  They were provided with a 30 day supply of scripts for prescription medications and refills must be obtained from  their PCP.  TIME SPENT (MINUTES): [place]. Perlie Gold  [facility] [address] [place], [place] [address] [phone] Office (Wednesdays and Fridays 8 AM - 5 PM) [phone] Cell (Monday-Friday 8 AM - 5 PM)

## 2014-07-28 ENCOUNTER — Other Ambulatory Visit: Payer: Self-pay | Admitting: Diagnostic Neuroimaging

## 2014-07-28 ENCOUNTER — Ambulatory Visit (INDEPENDENT_AMBULATORY_CARE_PROVIDER_SITE_OTHER): Payer: Medicare Other | Admitting: Podiatry

## 2014-07-28 ENCOUNTER — Encounter: Payer: Self-pay | Admitting: Podiatry

## 2014-07-28 VITALS — BP 144/94 | HR 79 | Temp 97.8°F | Resp 12

## 2014-07-28 DIAGNOSIS — L97529 Non-pressure chronic ulcer of other part of left foot with unspecified severity: Secondary | ICD-10-CM

## 2014-07-28 DIAGNOSIS — E11621 Type 2 diabetes mellitus with foot ulcer: Secondary | ICD-10-CM

## 2014-07-28 NOTE — Patient Instructions (Signed)
I'm referring this patient directly to the wound care center for immediate follow-up care and evaluation for for a nonhealing diabetic foot ulcer on the left hallux If pain, swelling increases in the left great toe present to ER

## 2014-07-28 NOTE — Progress Notes (Signed)
   Subjective:    Patient ID: Craig Henderson, male    DOB: Dec 07, 1929, 81 y.o.   MRN: [id number]  HPI  N-SORE LT FOOT GREAT AND SECOND TOES D-2 YEAR O-SLOWLY C-WORSE A-PRESSURE T-NONE This patient presents without a representative from nursing home. Somewhat difficult to elicit complete history today. Patient does relate approximately two-month history of a ulcer on the left hallux status currently be treated with topical antibiotic ointment.   Review of Systems  Cardiovascular: Positive for leg swelling.  Musculoskeletal: Positive for gait problem.  Skin: Positive for color change.  Neurological: Positive for weakness.  All other systems reviewed and are negative.      Objective:   Physical Exam  Patient appears confused and not able to answer questions completely There is no representative from assisted-living in treatment room today Patient transfers from wheelchair to treatment chair  Vascular: DP pulses 0/4 bilaterally PT pulses 2/4 bilaterally  Neurological: Patient not able to respond to 10 g monofilament wire questioning Patient not able to respond of rod vibratory tuning fork Ankle reflexes reactive bilaterally  Dermatological: The toenails are hypertrophic elongated discolored 6-10 The plantar left hallux as a 4 cm x 2 cm ulcer with eschar and local edema. There is no active drainage or malodor noted Superficial ulceration distal second left toe with a red granular base. There is no active drainage  Musculoskeletal: Hammertoe deformity second left      Assessment & Plan:   Assessment: Confused patient Diabetic Follow ulceration left hallux that needs further evaluation and treatment Superficial ulceration distal second left toe  Plan: At this time   the patient  Is referred immediately to wound care center for further evaluation and treatment of diabetic foot ulcers 2. If patient develops any sudden increase of pain, swelling, drainage, fever  present to ER

## 2014-07-30 ENCOUNTER — Encounter: Payer: Self-pay | Admitting: Podiatry

## 2014-07-30 ENCOUNTER — Telehealth: Payer: Self-pay | Admitting: *Deleted

## 2014-07-30 ENCOUNTER — Ambulatory Visit (INDEPENDENT_AMBULATORY_CARE_PROVIDER_SITE_OTHER): Payer: Medicare Other

## 2014-07-30 ENCOUNTER — Ambulatory Visit (INDEPENDENT_AMBULATORY_CARE_PROVIDER_SITE_OTHER): Payer: Medicare Other | Admitting: Podiatry

## 2014-07-30 VITALS — BP 110/66 | HR 66 | Temp 99.3°F | Resp 15

## 2014-07-30 DIAGNOSIS — E11621 Type 2 diabetes mellitus with foot ulcer: Secondary | ICD-10-CM | POA: Diagnosis not present

## 2014-07-30 DIAGNOSIS — M79672 Pain in left foot: Secondary | ICD-10-CM | POA: Diagnosis not present

## 2014-07-30 DIAGNOSIS — L89891 Pressure ulcer of other site, stage 1: Secondary | ICD-10-CM | POA: Diagnosis not present

## 2014-07-30 DIAGNOSIS — L97529 Non-pressure chronic ulcer of other part of left foot with unspecified severity: Secondary | ICD-10-CM

## 2014-07-30 MED ORDER — CEPHALEXIN 500 MG PO CAPS: 500.0000 mg | ORAL_CAPSULE | Freq: Two times a day (BID) | ORAL | Status: DC

## 2014-07-30 MED ORDER — SILVER SULFADIAZINE 1 % EX CREA: 1.0000 "application " | TOPICAL_CREAM | Freq: Every day | CUTANEOUS | Status: DC

## 2014-07-30 NOTE — Telephone Encounter (Signed)
I called to make an appointment for patient at [facility].  Craig Henderson scheduled him an appointment on 08/31/2014 at 9:15am with a 9am arrival.  Patient was given the appointment date.

## 2014-07-30 NOTE — Progress Notes (Signed)
HPI  Subjective: 81 year old male presents the office they with family members/represented his of the nursing home for concerns of left big toe infection. They state that there is been some bloody drainage coming from the toe and they believe that it is starting to become slightly more red. They concern for infection and he did not know what to do. No other complaints at this time.  Objective: Patient is confused and unable to answer questions. He is accompanied by family members/representative from the nursing facility. He is in a wheelchair. DP pulses nonpalpable, PT pulses 1/4 bilaterally. Decreased protective sensation with Derrel Nip monofilament, decreased vibratory sensation, Achilles tendon reflex is intact. Left hallux and the plantar aspect of the 4 x 2 cm eschar. There is mild edema to the left hallux. The overlying hallux nail is loose. Upon light trimming of the nail, the entire nail did fall off. There is no surrounding erythema, ascending cellulitis, drainage, fluctuance, crepitus, malodor at this time. There is a smaller ulceration of the distal aspect of the second toe with a granular wound base without any clinical signs of infection. There is underlying hammertoe contracture present. There is not appear to be any areas of tenderness to bilateral lower extremities. No overlying edema, erythema, increase in warmth except otherwise stated.  Assessment: 81 year old male with left hallux eschar  Plan: -At this time the hallux nails debrided. Upon light debridement of the nail the entire nail did fall off. -Continue with Silvadene dressing changes daily. -Will start Keflex in case of infection. -Monitor closely for any clinical signs or symptoms of infection. Directed to go directly to the emergency room should any occur. -Have an appointment a follow-up with wound care center. For some reason they're unable to follow-up at the wound care center to follow up with [place] in 2 weeks.  Call with any questions or concerns.

## 2014-07-31 ENCOUNTER — Encounter: Payer: Self-pay | Admitting: Podiatry

## 2014-08-05 ENCOUNTER — Encounter: Payer: Self-pay | Admitting: Internal Medicine

## 2014-08-10 ENCOUNTER — Other Ambulatory Visit: Payer: Self-pay | Admitting: Internal Medicine

## 2014-08-10 ENCOUNTER — Ambulatory Visit (HOSPITAL_COMMUNITY): Payer: Medicare Other

## 2014-08-10 ENCOUNTER — Ambulatory Visit (HOSPITAL_COMMUNITY)
Admission: RE | Admit: 2014-08-10 | Discharge: 2014-08-10 | Disposition: A | Discharge status: Home / Self Care | Payer: Medicare Other | Source: Ambulatory Visit | Attending: Cardiology | Admitting: Cardiology

## 2014-08-10 DIAGNOSIS — I771 Stricture of artery: Secondary | ICD-10-CM | POA: Diagnosis not present

## 2014-08-10 DIAGNOSIS — I319 Disease of pericardium, unspecified: Secondary | ICD-10-CM | POA: Diagnosis present

## 2014-08-10 DIAGNOSIS — I96 Gangrene, not elsewhere classified: Secondary | ICD-10-CM | POA: Diagnosis not present

## 2014-08-10 DIAGNOSIS — I3139 Other pericardial effusion (noninflammatory): Secondary | ICD-10-CM

## 2014-08-10 DIAGNOSIS — I313 Pericardial effusion (noninflammatory): Secondary | ICD-10-CM

## 2014-08-31 ENCOUNTER — Encounter (HOSPITAL_BASED_OUTPATIENT_CLINIC_OR_DEPARTMENT_OTHER): Payer: Medicare Other

## 2014-09-04 NOTE — Progress Notes (Signed)
Patient ID: Craig Henderson, male   DOB: 1929/09/18, 81 y.o.   MRN: [id number]  Craig Henderson living [place]     No Known Allergies     Chief Complaint  Patient presents with  . Hospitalization Follow-up    HPI:  He has been hospitalized for sepsis due to pneumonia and uti. He has a pericardial effusion which will be treated conservatively per cardiology. He is here for short term rehab at this time; I am not certain if going home will be a viable option for him.  He is unable to fully participate in the hpi or ros.    Past Medical History  Diagnosis Date  . Prostate cancer 07/18/11    bx=adenocarcinoma=PSA=12.45,gleason=4+5=9,4+3=7,4+4=8, 3+3=6 & 3+7,volume=156.35cc  . Diabetes mellitus   . Hypertension   . Hypercholesterolemia   . BPH (benign prostatic hyperplasia)     with nocturia,  . ED (erectile dysfunction)   . Hx of radiation therapy 10/23/11 - 12/18/11    prostate, seminal vesicles, lymph nodes  . Mild dementia     Past Surgical History  Procedure Laterality Date  . Prostate biopsy  07/18/2011    gleason 4+5=9  . Rotator cuff repair  2011  . Fracture surgery  35 years    right leg/rod placement  . Cardioversion N/A 05/11/2013    Procedure: CARDIOVERSION;  Surgeon: Laverda Page, MD;  Location: [facility] ENDOSCOPY;  Service: Cardiovascular;  Laterality: N/A;    VITAL SIGNS BP 118/64 mmHg  Pulse 82  Ht 5\' 8"  (1.727 m)  Wt 153 lb (69.4 kg)  BMI 23.27 kg/m2  SpO2 95%   Outpatient Encounter Prescriptions as of 06/25/2014  Medication Sig   namenda xr 28 mg  Take 28 mg daily    aricept 10 mg  Take 10 mg nightly   . atorvastatin (LIPITOR) 20 MG tablet Take 20 mg by mouth daily.  Marland Kitchen ENSURE (ENSURE) Take 237 mLs by mouth 3 (three) times daily.   . finasteride (PROSCAR) 5 MG tablet Take 1 tablet by mouth daily.  . Iron-FA-B Cmp-C-Biot-Probiotic (FUSION PLUS) CAPS Take 1 capsule by mouth daily.  . Maltodextrin-Xanthan Gum (RESOURCE THICKENUP CLEAR) POWD Use as directed  for nectar thick liquids during meals  . metoprolol tartrate (LOPRESSOR) 25 MG tablet Take 1 tablet (25 mg total) by mouth 3 (three) times daily.  Marland Kitchen oxyCODONE (OXY IR/ROXICODONE) 5 MG immediate release tablet Take 1 tablet (5 mg total) by mouth daily  . rivaroxaban (XARELTO) 20 MG TABS tablet Take 20 mg by mouth daily with supper.  . sitaGLIPtin (JANUVIA) 100 MG tablet Take 100 mg by mouth daily.  . tamsulosin (FLOMAX) 0.4 MG CAPS capsule Take 0.8 mg by mouth at bedtime.  . Travoprost, BAK Free, (TRAVATAN) 0.004 % SOLN ophthalmic solution Place 1 drop into both eyes at bedtime.  . traZODone (DESYREL) 50 MG tablet Take 50 mg by mouth at bedtime.      SIGNIFICANT DIAGNOSTIC EXAMS  06-18-14: chest x-ray: Left midlung and left base opacities worrisome for pneumonia.  06-18-14: left foot x-ray: No definite acute osseous abnormalities.   06-19-14: ct of head: 1. Atrophy and chronic small vessel ischemic change. 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 No acute intracranial process. 2. Left-sided sinusitis, possibly acute.  06-19-14: 2-d echo:  - Normal LV wall thickness with LVEF 60-65%. Indeterminate diastolic function in the setting of atrial flutter. Sclerotic aortic valve. Trivial mitral and tricuspid regurgitation. Normal PASP 21 mmHg. A moderate pericardial effusion was  identified circumferential to the heart, but most prominent anterior to the right ventricle. Mild undulation of the right atrial free wall noted, but no obvious tamponade physiology based on respiratory variation in mitral inflow.  06-21-14: swallow study: Nectar-thick liquid  06-22-14: chest x-ray: No acute cardiopulmonary abnormality seen    LABS REVIEWED:   06-18-14: wbc 19.7; hgb 11.4; hct 34.0; mcv 95.5; plt 198; glucose 243; bun 20; creat 1.42; k+4.5; na++136; liver normal albumin 3.4; urine culture enterobacter cloacae 06-19-14: hgb a1c 5.1; sed rate 55; chol 130; ldl 70; trig 52; hdl  50 06-21-14: wbc 15.1; hgb 9.3; hct 28.0; mcv 94.6; plt 185; glucose 127; bun 17; creat 1.14; k+4.1; na++147; liver normal albumin 2.8; mag 1.9 06-24-14: wbc 12.6; hgb 9.4; hct 28.0; mcv 93.6; plt 219    Review of Systems  Unable to perform ROS    Physical Exam  Constitutional: No distress.  Neck: Neck supple. No JVD present. No thyromegaly present.  Cardiovascular: Normal rate, regular rhythm and intact distal pulses.   Pedal pulses faint   Respiratory: Effort normal and breath sounds normal. No respiratory distress.  GI: Soft. Bowel sounds are normal. He exhibits no distension. There is no tenderness.  Musculoskeletal: He exhibits no edema.  Is able to move all extremities   Neurological: He is alert.  Skin: Skin is warm and dry. He is not diaphoretic.  Left great toe necrotic 3.0 x 4.5 cm.  Numerous necrotic areas on toes       ASSESSMENT/ PLAN:  1., sepsis due to pneumonia and uti: will complete levaquin in 3 days; will continue to monitor his status.   2. Dysphagia: no signs of aspiration present; will continue nectar thick liquids  3. Dementia: without change in status; will continue aricept 10 mg nightly and namenda xr 28 mg daily   4. Dyslipidemia: will continue lipitor 20 mg daily his ldl is 70  5. Diabetes: will continue januvia 100 mg daily and will add humalog 5 units prior to meals for cbg >=150; as his cbg this am was 278  Will check cbg with meals  6. Left great toe wound will continue current treatment; will continue oxycodone 5 mg daily for pain will begin prostat 30 cc three times daily his albumin is 2.8  7. Bph: will continue flomax 0.8 mg daily and proscar 5 mg daily   8. Anemia: will continue fusion plus capsule his hgb is 9.4  9. Hypertension: will conitinue lopressor 50 mg three times daily   10. Atrial flutter: will continue xarelto 20 mg daily; will continue lopressor 50 mg three times daily for rate control  11. Pericardial effusion: will  continue to monitor he is being followed by cardiology   12. Insomnia: will continue trazodone 50 mg nighty   13. Glaucoma: will continue travoprost to both eyes nightly      Time spent with patient 50 minutes.      Ok Edwards NP [facility]  Contact [phone] Monday through Friday 8am- 5pm  After hours call [phone]

## 2014-09-05 DIAGNOSIS — N400 Enlarged prostate without lower urinary tract symptoms: Secondary | ICD-10-CM | POA: Insufficient documentation

## 2014-09-07 ENCOUNTER — Ambulatory Visit (INDEPENDENT_AMBULATORY_CARE_PROVIDER_SITE_OTHER): Payer: Medicare Other | Admitting: Internal Medicine

## 2014-09-07 ENCOUNTER — Encounter: Payer: Self-pay | Admitting: Internal Medicine

## 2014-09-07 VITALS — BP 119/76 | HR 76 | Temp 98.4°F | Ht 62.0 in | Wt 152.0 lb

## 2014-09-07 DIAGNOSIS — I96 Gangrene, not elsewhere classified: Secondary | ICD-10-CM

## 2014-09-07 LAB — CBC WITH DIFFERENTIAL/PLATELET
BASOS ABS: 0 10*3/uL (ref 0.0–0.1)
Basophils Relative: 0 % (ref 0–1)
EOS ABS: 0.1 10*3/uL (ref 0.0–0.7)
Eosinophils Relative: 1 % (ref 0–5)
HCT: 36.4 % — ABNORMAL LOW (ref 39.0–52.0)
Hemoglobin: 12 g/dL — ABNORMAL LOW (ref 13.0–17.0)
LYMPHS PCT: 15 % (ref 12–46)
Lymphs Abs: 1.6 10*3/uL (ref 0.7–4.0)
MCH: 30.6 pg (ref 26.0–34.0)
MCHC: 33 g/dL (ref 30.0–36.0)
MCV: 92.9 fL (ref 78.0–100.0)
MPV: 11.2 fL (ref 8.6–12.4)
Monocytes Absolute: 0.7 10*3/uL (ref 0.1–1.0)
Monocytes Relative: 7 % (ref 3–12)
Neutro Abs: 8 10*3/uL — ABNORMAL HIGH (ref 1.7–7.7)
Neutrophils Relative %: 77 % (ref 43–77)
Platelets: 242 10*3/uL (ref 150–400)
RBC: 3.92 MIL/uL — AB (ref 4.22–5.81)
RDW: 14.5 % (ref 11.5–15.5)
WBC: 10.4 10*3/uL (ref 4.0–10.5)

## 2014-09-07 LAB — BASIC METABOLIC PANEL
BUN: 19 mg/dL (ref 6–23)
CALCIUM: 9 mg/dL (ref 8.4–10.5)
CO2: 22 meq/L (ref 19–32)
Chloride: 105 mEq/L (ref 96–112)
Creat: 1.08 mg/dL (ref 0.50–1.35)
GLUCOSE: 147 mg/dL — AB (ref 70–99)
Potassium: 4.2 mEq/L (ref 3.5–5.3)
SODIUM: 140 meq/L (ref 135–145)

## 2014-09-07 LAB — SEDIMENTATION RATE: Sed Rate: 28 mm/hr — ABNORMAL HIGH (ref 0–20)

## 2014-09-07 LAB — C-REACTIVE PROTEIN: CRP: 0.7 mg/dL — AB (ref <0.60)

## 2014-09-07 NOTE — Progress Notes (Signed)
   Subjective:    Patient ID: Craig Henderson, male    DOB: 1929-10-08, 81 y.o.   MRN: [id number]  HPI Craig Henderson is an 81 y/o male here for dry gangrene of a diabetic ulcer to the left great toe. He was hospitalized in mid March with sepsis and CAP that was treated with IV Vanc and Zosyn. He has continued on multiple antibiotics which his family is unable to recall the names of. He has been followed by Podiatry and wound care. His toe has a patch of eschar that has not been surgically debrided. Currently home health nurses are using Santyl once daily for wound care. He has been referred here for further eval and recommendations for treatment plan. He does have an lower extremity angio with run offs scheduled for next week to eval his vasculature. His daughter and wife are present with him and state that his toe looks about the same since hospital discharge. There has not been any drainage, redness, swelling or warmth to the affected area. They are close to finishing his course of antibiotics.    Review of Systems  Constitutional: Negative for fever, chills, diaphoresis, fatigue and unexpected weight change.  HENT: Negative for dental problem.   Eyes: Negative for visual disturbance.  Respiratory: Negative for cough and shortness of breath.   Cardiovascular: Negative for chest pain and palpitations.  Gastrointestinal: Negative for abdominal pain, diarrhea, blood in stool and abdominal distention.  Genitourinary: Negative for difficulty urinating.  Musculoskeletal: Negative.   Skin: Positive for wound. Negative for color change and pallor.       Eschar to left great toe, denies redness, swelling. Positive for pain with palpation.  Neurological: Negative for dizziness.  Hematological: Negative for adenopathy.  Psychiatric/Behavioral: Negative for behavioral problems.       Objective:   Physical Exam        Assessment & Plan:  Mrsa deep tissue infection/dry gangrene: - Check sed  rate, crp, cbc and bmp.  - Possibly scheduled for wound care referral by podiatry, family will call us Friday with more information and to schedule appt with our office - Has appt for angiogram by dr. Einar Gip to eval vasculature

## 2014-09-07 NOTE — Progress Notes (Signed)
Subjective:    Patient ID: Craig Henderson, male    DOB: [birth year], 81 y.o.   MRN: [id number]  HPI  81yo M with 3 month hx of dementia, DM2, who has long hx of diabetic foot/left great toe ulcer, dry gangrene currently on levofloxacin.referred for evaluation of osteomyelitis. Family reports that he is being seen by Dr. Adrian Prows to assess peripheral vascular disease  Current Outpatient Prescriptions on File Prior to Visit  Medication Sig Dispense Refill  . atorvastatin (LIPITOR) 20 MG tablet Take 20 mg by mouth daily.    Marland Kitchen donepezil (ARICEPT) 10 MG tablet TAKE 1 TABLET BY MOUTH EVERY DAY AT BEDTIME 30 tablet 0  . ENSURE (ENSURE) Take 237 mLs by mouth 3 (three) times daily.     . Iron-FA-B Cmp-C-Biot-Probiotic (FUSION PLUS) CAPS Take 1 capsule by mouth daily.    . metoprolol tartrate (LOPRESSOR) 25 MG tablet Take 1 tablet (25 mg total) by mouth 3 (three) times daily. 90 tablet 1  . oxyCODONE (OXY IR/ROXICODONE) 5 MG immediate release tablet Take 1 tablet (5 mg total) by mouth once. 30 tablet 0  . sitaGLIPtin (JANUVIA) 100 MG tablet Take 100 mg by mouth daily.    . tamsulosin (FLOMAX) 0.4 MG CAPS capsule Take 0.8 mg by mouth at bedtime.  0  . Travoprost, BAK Free, (TRAVATAN) 0.004 % SOLN ophthalmic solution Place 1 drop into both eyes at bedtime.    . finasteride (PROSCAR) 5 MG tablet Take 1 tablet by mouth daily.    Marland Kitchen levofloxacin (LEVAQUIN) 500 MG tablet Take 1 tablet (500 mg total) by mouth daily. 3 tablet 0  . Maltodextrin-Xanthan Gum (RESOURCE THICKENUP CLEAR) POWD Use as directed for nectar thick liquids during meals 1 Can 1  . NAMENDA XR 28 MG CP24 24 hr capsule TAKE ONE CAPSULE BY MOUTH EVERY DAY--NEEDS APPT 30 capsule 0  . rivaroxaban (XARELTO) 20 MG TABS tablet Take 1 tablet (20 mg total) by mouth daily with supper. 30 tablet   . silver sulfADIAZINE (SILVADENE) 1 % cream Apply 1 application topically daily. 50 g 0  . traZODone (DESYREL) 50 MG tablet Take 50 mg by mouth at  bedtime.     No current facility-administered medications on file prior to visit.   Active Ambulatory Problems    Diagnosis Date Noted  . Prostate cancer 07/18/2011  . Hypertension 01/17/2012  . Diabetes mellitus type 2, uncontrolled 01/17/2012  . Metabolic encephalopathy 17/49/4496  . HTN (hypertension) 03/05/2012  . Fever 03/05/2012  . Leukocytosis 03/05/2012  . Ileus with mild constipation 03/05/2012  . Acute renal failure (ARF) 03/07/2012  . Acute urinary retention 03/07/2012  . Hypomagnesemia 03/12/2012  . Atrial flutter 05/11/2013  . Mild dementia 05/13/2013  . Gait difficulty 05/13/2013  . Sepsis 06/18/2014  . Diabetic foot ulcer 06/18/2014  . Dementia 06/18/2014  . CAP (community acquired pneumonia) 06/18/2014  . Abscess, toe   . DM foot ulcers   . Diabetes type 2, uncontrolled   . Blood poisoning   . Toe infection   . Pericardial effusion   . Moderate malnutrition   . BPH (benign prostatic hyperplasia) 09/05/2014   Resolved Ambulatory Problems    Diagnosis Date Noted  . GERD (gastroesophageal reflux disease) 03/14/2012   Past Medical History  Diagnosis Date  . Diabetes mellitus   . Hypercholesterolemia   . ED (erectile dysfunction)   . Hx of radiation therapy 10/23/11 - 12/18/11   History  Substance Use Topics  . Smoking status: Former  Smoker -- 1.00 packs/day for 10 years    Types: Cigarettes    Quit date: 09/04/1972  . Smokeless tobacco: Never Used     Comment: quit 20 years ago  . Alcohol Use: 0.0 oz/week    0 Standard drinks or equivalent per week     Comment: not anymore  family history includes Cancer in his father; Pneumonia in his mother.  Review of Systems Mild toe pain. 10 point ros is negative    Objective:   Physical Exam  BP 119/76 mmHg  Pulse 76  Temp(Src) 98.4 F (36.9 C) (Oral)  Ht 5\' 2"  (1.575 m)  Wt 152 lb (68.947 kg)  BMI 27.79 kg/m2 Physical Exam  Constitutional: He is oriented to person,only. He appears well-developed  and well-nourished. No distress. Appears stated age Skin: Skin is warm and dry. Left great toe has dark cap no drainage Ext: decrease pulse to left foot   Lab Results  Component Value Date   ESRSEDRATE 28* 09/07/2014   Lab Results  Component Value Date   CRP 0.7* 09/07/2014   CBC    Component Value Date/Time   WBC 10.4 09/07/2014 1512   RBC 3.92* 09/07/2014 1512   HGB 12.0* 09/07/2014 1512   HCT 36.4* 09/07/2014 1512   PLT 242 09/07/2014 1512   MCV 92.9 09/07/2014 1512   MCH 30.6 09/07/2014 1512   MCHC 33.0 09/07/2014 1512   RDW 14.5 09/07/2014 1512   LYMPHSABS 1.6 09/07/2014 1512   MONOABS 0.7 09/07/2014 1512   EOSABS 0.1 09/07/2014 1512   BASOSABS 0.0 09/07/2014 1512    BMET    Component Value Date/Time   NA 140 09/07/2014 1512   K 4.2 09/07/2014 1512   CL 105 09/07/2014 1512   CO2 22 09/07/2014 1512   GLUCOSE 147* 09/07/2014 1512   BUN 19 09/07/2014 1512   CREATININE 1.08 09/07/2014 1512   CREATININE 1.02 06/23/2014 0442   CALCIUM 9.0 09/07/2014 1512   GFRNONAA 66* 06/23/2014 0442   GFRAA 77* 06/23/2014 0442   Left foot xray in April:2 views of a skeletally mature individual were obtained of the left foot.  Study includes AP and oblique views. Unable to perform a lateral view for this patient.  There is no definitive evidence of acute osteomyelitis at this time. There is no soft tissue emphysema. No evidence of acute fracture or stress fracture.     Assessment & Plan:  mrsa deep tissue infection/dry gangrene = will check sed rate, crp, cbc and bmp. Will need to see if he is referred to wound care by his podiatrist. He has upcoming appt for angiogram by dr. Adrian Prows to see if he has adequate circulation. Difficult to see if he has osteo since hard cap is not dissolved hard to tell if we can probe to bone. Xray is negative, unable to get mri due to metal in legs.  Will see back in 3-4 wk to see if has had angiography to see if can revascularize in order to  have proper wound healing  Refer to wound care for assessment of foot ulcer

## 2014-09-08 ENCOUNTER — Ambulatory Visit: Payer: Medicare Other | Admitting: Cardiovascular Disease

## 2014-09-12 NOTE — H&P (Signed)
OFFICE VISIT NOTES COPIED TO EPIC FOR DOCUMENTATION   Craig Henderson 08/24/2014 8:39 AM Location: [facility] Patient #: [phone] DOB: [birth year] Married / Language: English / Race: Black or African American Male    History of Present Illness(Craig Carlynn Herald, MD; 08/24/2014 4:12 PM) Patient words: 6 Mth F/U; Lab results. Pt was recently in the Hospital (March 2016) due to having Pneumonia. Pt did not bring medications, his wife verbally went over them.  The patient is a 81 year old male who presents for a follow-up for Atrial flutter. Craig Henderson is an 81 year-old African-American male with hypertension, hyperlipidemia, diabetes, and mild dementia presenting for 6 month follow-up of atrial flutter and first degree AV block. He was diagnosed with atrial flutter after feeling palpitations on 01/25/2013, started on Xarelto 01/29/2013 and underwent cardioversion on 05/11/2013. He has maintained sinus rhythm since cardioversion and presents for routine follow-up.  Patient presently doing well and has developed recent UTI but otherwise no other specific complaints. Wife present at the bedside, there is no history of fall, there is no history of any gait disturbance, although patient does walk help of a walker.  No chest pain, shortness of breath, PND or orthopnea. No symptoms to suggest TIA or claudication.  Patient has been doing well recently, with no recurrence of palpitations and no development of new symptoms. He is currently being treated for a UTI, otherwise there have been no recent medication changes. Tolerating Xarelto well without bleeding diathesis.  Additional reason for visit:  Diabetic footis described as the following: Symptoms include drainage and open sores, while symptoms do not include swelling. Symptoms are located on the left toe (reat toe). Onset was gradual 3 month(s) ago. Onset followed an ingrown toenail. The patient  describes this as improving (gradually improving). Current treatment includes diabetic control, frequent dressing changes (being managed at the foot center) and topical antibiotics. By report there is fair compliance with treatment. Recently the disease has been improving.    Problem List/Past Medical(Craig Henderson; 08/24/2014 3:18 PM) Diabetes mellitus with peripheral vascular disease (E11.51). Labs 02/25/2014: HbA1c 6.8% Anemia, mild (D64.9). Labs 02/25/2014: Hb 10.6/HCT 31.2 with normocytic indices, WBC 12.8  Labs 05/07/2013: HB 11.9/HCT 34.4. Indicis were within normal limits. Platelet count normal at 198.  Labs 08/17/2013: HB 12.0/HCT 34.2.  Labs 10/21/2012: CMP within normal limits. eGFR 51m. TSH was normal. CBC revealed hemoglobin 10.8/hematocrit 32.7 with normal indicis.  CBC 01/25/2013: HB 12.4/HCT 35.5. Indicis were normal. On 03/13/2012, hemoglobin was 8.7 with hematocrit of 24.5. TSH on 05/13/2012 was 5.33 to, mildly elevated. Anticoagulant long-term use (Z79.01). Labs 02/25/2014: Hb 10.6/HCT 31.2 with normocytic indices, no significant change from prior Hyperlipidemia, group A (E78.0). Labs 02/25/2014: Total cholesterol 133, triglycerides 74, HDL 57, LDL 61, LDL particle #654, LP-IR score 25, serum glucose 219, BUN 25, creatinine 1.63, eGFR 45, CMP otherwise normal  Labs 10/21/2012: Total cholesterol 264, triglycerides 140, HDL 52, LDL 184. AV bloc first degree (I44.0). EKG 08/24/2013: Sinus rhythm with first-degree AV block at a rate of 66 bpm, low voltage complexes, nonspecific T abnormality. No significant change from 05/20/2013 Preoperative cardiovascular examination (Z01.810). Right knee arthritis and evaluated by Dr. CEulas Henderson patient has not had surgery yet. Chronic dementia, without behavioral disturbance (F03.90). Very mild and follows Dr. VNolberto Henderson Essential hypertension, benign (I10). Labs 02/25/2014: serum glucose 219, BUN 25, creatinine 1.63, eGFR 45,  CMP otherwise normal Controlled diabetes mellitus (E11.9). Labs 02/25/2014: HbA1c 6.8% History of cardioversion (Z98.89). 05/11/2013: Successful cardioversion from  atypical atrial flutter to normal sinus rhythm. 50 J direct current was delivered. Atypical atrial flutter (I48.4). 01/25/2014 CHA2DS2-VASc Score is 4 with yearly risk of stroke of 4 %. Bleeding risk is 1 %/year. Rec: Craig Henderson   05/11/2012: Successful cardioversion from atypical atrial flutter to normal sinus rhythm. 50 J direct current was delivered.  Echo- 02/19/13 1. Left ventricular cavity is normal in size. Normal global wall motion. Normal systolic global function. Calculated EF 60%. 2. Left atrial cavity is moderately dilated. Right atrial cavity is mildly dilated. 3. Mild to moderate mitral regurgitation. Mild tricuspid regurgitation.  Lexiscan myoview stress 02/16/13: 1. Resting EKG shows atypical atrial flutter, atrial fibrillation. Poor R wave progression. Stress EKG was non diagnostic for ischemia. No ST-T changes of ischemia noted with pharmacologic stress testing. 2. The perfusion study demonstrated normal isotope uptake both at rest and stress. There was no evidence of ischemia or scar. Dynamic gated images reveal normal wall motion and endocardial thickening. Left ventricular ejection fraction was estimated to be 52%.    Allergies(Craig Henderson; 08/24/2014 3:18 PM) No Known Drug Allergies. 02/05/2013    Family History(Craig Henderson; 08/24/2014 3:18 PM) Siblings. none Mother. Deceased. in her 80's-h/o MI at age 29. Father. Deceased. in his 20's-doesn't know medical history.    Social History(Craig Henderson; 08/24/2014 3:18 PM) Number of Children. 4. Non Drinker/No Alcohol Use Marital status. Married. Current tobacco use. Former smoker. quit in 1970.    Past Surgical History(Craig Henderson; 08/24/2014 3:18 PM) Surgery on both legs after breaking them. 1974    Medication  History(Craig Henderson; 08/24/2014 3:40 PM) Atorvastatin Calcium (20MG Tablet, 1 (one) Tablet Oral daily, Taken starting 04/11/2014) Active. Xarelto (20MG Tablet, 1 Oral in the evening after dinner., Taken starting 05/25/2014) Active. Tamsulosin HCl (0.4MG Capsule, 1 Oral daily, Taken starting 02/19/2014) Active. Januvia (100MG Tablet, 1 Oral daily) Active. Donepezil HCl (10MG Tablet, 1 Oral at bedtime) Active. Namenda XR (28MG Capsule ER 24HR, 1 Oral daily) Active. Fusion Plus (1 Oral daily) Active. TraZODone HCl (50MG Tablet, 1 Oral at bedtime) Active. Medications Reconciled. Finasteride (5MG Tablet, 1 Oral daily) Active. OxyCODONE HCl (5MG Tablet, 1 Oral as needed) Active. Metoprolol Tartrate (25MG Tablet, 1 Oral daily) Active. Clindamycin HCl (300MG Capsule, 1 Oral three times daily for toe infection) Active. Travoprost (0.004% Solution, 1 drop each eye Ophthalmic daily) Active. Santyl (250UNIT/GM Ointment, Apply to toe External when dressing is changed) Active.    Diagnostic Studies History(Craig Henderson; 08/24/2014 3:34 PM) Echocardiogram. Echo- 02/19/13 1. Left ventricular cavity is normal in size. Normal global wall motion. Normal systolic global function. Calculated EF 60%. 2. Left atrial cavity is moderately dilated. 3. Right atrial cavity is mildly dilated. 4. Mild aortic valve leaflet calcification. Aortic valve leaflet mobility is normal. 5. Mitral valve structurally normal. Mild to moderate mitral regurgitation. 6. Tricuspid valve structurally normal. Mild tricuspid regurgitation. Nuclear stress test. Leane Call stress 02/16/13: 1. Resting EKG shows atypical atrial flutter, atrial fibrillation. Poor R wave progression. Stress EKG was non diagnostic for ischemia. No ST-T changes of ischemia noted with pharmacologic stress testing. 2. The perfusion study demonstrated normal isotope uptake both at rest and stress. There was no evidence of ischemia or  scar. Dynamic gated images reveal normal wall motion and endocardial thickening. Left ventricular ejection fraction was estimated to be 52%. Treadmill stress test. 1972 Endoscopy. 2010 Normal. Colonoscopy. 2015 Normal. (2009) 2 benign polyps removed. Lower Extremity Dopplers. 08/10/2014 TEE. 06/19/2014 LV EF: 60%-65%    Review of Systems(Craig Carlynn Herald, MD; 08/24/2014  4:15 PM) General:Not Present- Fever and Night Sweats. Skin:Not Present- Itching and Rash. HEENT:Not Present- Headache. Respiratory:Not Present- Difficulty Breathing. Cardiovascular:Not Present- Claudications, Orthopnea and Swelling of Extremities. Gastrointestinal:Present- Black, Tarry Stool. Not Present- Abdominal Pain, Constipation, Diarrhea, Nausea and Vomiting. Male Genitourinary:Present- Dysuria and Frequency. Musculoskeletal:Present- Joint Pain, Leg Weakness and Physical Disability (Gait instability an duses a walker). Not Present- Leg Cramps. Neurological:Present- Decreased Memory. Not Present- Headaches. Psychiatric:Present- Depression (since march 2016, wheel chair bound, does walk with walker minimal activity). Hematology:Not Present- Blood Clots, Easy Bruising and Nose Bleed.    Vitals(Craig Henderson; 08/24/2014 3:43 PM) 08/24/2014 3:19 PM Weight: 140 lb Height: 66 in Body Surface Area: 1.72 m Body Mass Index: 22.6 kg/m Pulse: 59 (Regular) P.OX: 97% (Room air) BP: 118/64 (Sitting, Left Arm, Standard)     Physical Exam(Craig Carlynn Herald, MD; 08/24/2014 4:27 PM) The physical exam findings are as follows:   General Mental Status- Alert. General Appearance- Cooperative, Appears older than stated ageand Depressed. Not in acute distress. Orientation- Oriented X3. Build & Nutrition- Well nourished and Moderately built.   Head and Neck Thyroid Gland Characteristics- no palpable nodules. no palpable enlargement.   Chest and Lung Exam Palpation:Tender- No chest  wall tenderness. Auscultation: Breath sounds:- Clear.   Cardiovascular Inspection:Jugular vein- Right- No Distention. Auscultation:Heart Sounds- S1 WNL, S2 WNL and No gallop present. Murmurs & Other Heart Sounds: Murmur:- No murmur.   Abdomen Palpation/Percussion:Palpation and Percussion of the abdomen reveal - Non Tender and No hepatosplenomegaly. Auscultation:Auscultation of the abdomen reveals - Bowel sounds normal.   Peripheral Vascular Lower Extremity:Inspection- Left- Delayed capillary refilland Digital ulcers(left big toe). No Pigmentation or Varicose veins. Right- No Pigmentation or Varicose veins. Palpation:Edema- Left- No edema. Right- No edema. Femoral pulse- Bilateral- 2+. Popliteal pulse- Bilateral- Feeble. Dorsalis pedis pulse- Bilateral- Absent. Posterior tibial pulse- Bilateral- Absent. Carotid arteries- Left- No Carotid bruit. Right- No Carotid bruit. Abdomen- No prominent abdominal aortic pulsation or epigastric bruit.   Neurologic- Did not examine.   Musculoskeletal- Did not examine.   Assessment & Plan(Craig Carlynn Herald, MD; 08/24/2014 5:53 PM) Atypical atrial flutter (I48.4) Story: CHA2DS2-VASc Score is 4 with yearly risk of stroke of 4 %. Bleeding risk is 1 %/year. Rec: [place]  05/11/2012: Successful cardioversion from atypical atrial flutter to normal sinus rhythm. 50 J direct current was delivered.  Echo- 02/19/13 1. Left ventricular cavity is normal in size. Normal global wall motion. Normal systolic global function. Calculated EF 60%. 2. Left atrial cavity is moderately dilated. Right atrial cavity is mildly dilated. 3. Mild to moderate mitral regurgitation. Mild tricuspid regurgitation.  Lexiscan myoview stress 02/16/13: 1. Resting EKG shows atypical atrial flutter, atrial fibrillation. Poor R wave progression. Stress EKG was non diagnostic for ischemia. No ST-T changes of ischemia noted with pharmacologic stress  testing. 2. The perfusion study demonstrated normal isotope uptake both at rest and stress. There was no evidence of ischemia or scar. Dynamic gated images reveal normal wall motion and endocardial thickening. Left ventricular ejection fraction was estimated to be 52%. Impression: EKG 08/24/2014: Normal sinus rhythm/sinus arrhythmia at the rate of 61 bpm, first-degree AV block. Low-voltage complexes, poor R-wave progression, cannot exclude anterior infarct old. No ischemia. No change from EKG 02/25/2014.  EKG 04/14/2013: Atrial flutter with 2-1 conduction, atypical atrial flutter. Nonspecific T abnormality. No significant change from prior EKG on 02/05/2013. Current Plans l Complete electrocardiogram (93000) Future Plans l 09/07/2014: PT (PROTHROMBIN TIME) (76195) - one time  Hyperlipidemia, group A (E78.0) Story: Labs 02/25/2014: Total cholesterol 133,  triglycerides 74, HDL 57, LDL 61, LDL particle #654, LP-IR score 25, serum glucose 219, BUN 25, creatinine 1.63, eGFR 45, CMP otherwise normal  Labs 10/21/2012: Total cholesterol 264, triglycerides 140, HDL 52, LDL 184.  Diabetes mellitus with peripheral vascular disease (E11.51) Story: Labs 02/25/2014: HbA1c 6.8%  ABI Impression: 08/10/2014: Bilateral ABI right 0.99, left 0.97. Normal PVR's. Doppler waveforms suggest mild to moderate arterial insufficiency. Current Plans l Currently a tobacco non-user [phone]) Future Plans l 04/19/7354: METABOLIC PANEL, BASIC (70141) - one time l 09/07/2014: CBC & PLATELETS (AUTO) (03013) - one time  Non-healing ulcer of left foot, limited to breakdown of skin (L97.521) Current Plans l Mechanism of underlying disease process and action of medications discussed with the patient. I discussed primary/secondary prevention and also dietary counceling was done. Patient is presently tolerating Xarelto, however due to nonhealing foot ulceration, abnormal physical examination where I'm  unable to feel his pulses and is a radial pulses are also reduced, I have reviewed the results of the ABI that was performed in outside facility, suspect he should proceed with peripheral arteriogram to exclude significant high-grade stenosis. Patient is pleasant and accompanied by his daughter and wife at the bedside.  Patient to the past 2-3 months has become extremely depressed, very limited mobility, has essentially become wheelchair bound. To prevent limb loss and hopefully help in both healing, we'll proceed with angiogram and possible angioplasty. Needs to schedule for peripheral arteriogram and possible angioplasty given symptoms. Patient understands the risks, benefits, alternatives including medical therapy, CT angiography. Patient understands <1-2% risk of death, embolic complications, bleeding, infection, renal failure, urgent surgical revascularization, but not limited to these and wants to proceed.     Addendum Note(Bridgette Ebony Hail, AGNP-C; 09/09/2014 8:23 AM) Labs 09/08/2014: Serum glucose 171, creatinine 1.11, BMP otherwise normal, HB 11.5/HCT 32.9 with normocytic indices, CBC otherwise normal, PT 11.4, INR 1.1    Signed electronically by Laverda Page, MD (08/24/2014 5:54 PM)

## 2014-09-14 ENCOUNTER — Encounter (HOSPITAL_COMMUNITY): Payer: Self-pay | Admitting: Cardiology

## 2014-09-14 ENCOUNTER — Ambulatory Visit (HOSPITAL_COMMUNITY)
Admission: RE | Admit: 2014-09-14 | Discharge: 2014-09-14 | Disposition: A | Discharge status: Home / Self Care | Payer: Medicare Other | Source: Ambulatory Visit | Attending: Cardiology | Admitting: Cardiology

## 2014-09-14 ENCOUNTER — Encounter (HOSPITAL_COMMUNITY)
Admission: RE | Disposition: A | Discharge status: Home / Self Care | Payer: Medicare Other | Source: Ambulatory Visit | Attending: Cardiology

## 2014-09-14 DIAGNOSIS — E11622 Type 2 diabetes mellitus with other skin ulcer: Secondary | ICD-10-CM | POA: Diagnosis not present

## 2014-09-14 DIAGNOSIS — E1151 Type 2 diabetes mellitus with diabetic peripheral angiopathy without gangrene: Secondary | ICD-10-CM | POA: Insufficient documentation

## 2014-09-14 DIAGNOSIS — I440 Atrioventricular block, first degree: Secondary | ICD-10-CM | POA: Diagnosis not present

## 2014-09-14 DIAGNOSIS — I484 Atypical atrial flutter: Secondary | ICD-10-CM | POA: Diagnosis not present

## 2014-09-14 DIAGNOSIS — E11621 Type 2 diabetes mellitus with foot ulcer: Secondary | ICD-10-CM | POA: Diagnosis present

## 2014-09-14 DIAGNOSIS — F0390 Unspecified dementia without behavioral disturbance: Secondary | ICD-10-CM | POA: Insufficient documentation

## 2014-09-14 DIAGNOSIS — E785 Hyperlipidemia, unspecified: Secondary | ICD-10-CM | POA: Insufficient documentation

## 2014-09-14 DIAGNOSIS — L97509 Non-pressure chronic ulcer of other part of unspecified foot with unspecified severity: Secondary | ICD-10-CM

## 2014-09-14 DIAGNOSIS — I10 Essential (primary) hypertension: Secondary | ICD-10-CM | POA: Insufficient documentation

## 2014-09-14 HISTORY — PX: PERIPHERAL VASCULAR CATHETERIZATION: SHX172C

## 2014-09-14 LAB — GLUCOSE, CAPILLARY: Glucose-Capillary: 164 mg/dL — ABNORMAL HIGH (ref 65–99)

## 2014-09-14 SURGERY — LOWER EXTREMITY ANGIOGRAPHY

## 2014-09-14 MED ORDER — HYDRALAZINE HCL 20 MG/ML IJ SOLN
INTRAMUSCULAR | Status: AC
  Filled 2014-09-14: qty 1

## 2014-09-14 MED ORDER — HEPARIN (PORCINE) IN NACL 2-0.9 UNIT/ML-% IJ SOLN
INTRAMUSCULAR | Status: AC
  Filled 2014-09-14: qty 1000

## 2014-09-14 MED ORDER — NITROGLYCERIN 0.2 MG/ML ON CALL CATH LAB
INTRAVENOUS | Status: DC | PRN
  Administered 2014-09-14: 400 ug via INTRA_ARTERIAL

## 2014-09-14 MED ORDER — NITROGLYCERIN 1 MG/10 ML FOR IR/CATH LAB
INTRA_ARTERIAL | Status: AC
  Filled 2014-09-14: qty 10

## 2014-09-14 MED ORDER — MORPHINE SULFATE 4 MG/ML IJ SOLN: 4.0000 mg | INTRAMUSCULAR | Status: DC | PRN

## 2014-09-14 MED ORDER — IODIXANOL 320 MG/ML IV SOLN
INTRAVENOUS | Status: DC | PRN
  Administered 2014-09-14: 48 mL via INTRA_ARTERIAL

## 2014-09-14 MED ORDER — SODIUM CHLORIDE 0.9 % IV SOLN
INTRAVENOUS | Status: DC
  Administered 2014-09-14: 06:00:00 via INTRAVENOUS

## 2014-09-14 MED ORDER — LIDOCAINE HCL (PF) 1 % IJ SOLN
INTRAMUSCULAR | Status: AC
  Filled 2014-09-14: qty 30

## 2014-09-14 MED ORDER — RIVAROXABAN 20 MG PO TABS
20.0000 mg | ORAL_TABLET | Freq: Every day | ORAL | Status: DC
Start: 2014-09-15 — End: 2014-12-02

## 2014-09-14 MED ORDER — LIDOCAINE HCL (PF) 1 % IJ SOLN
INTRAMUSCULAR | Status: DC | PRN
  Administered 2014-09-14: 20 mL via INTRADERMAL

## 2014-09-14 MED ORDER — HYDRALAZINE HCL 20 MG/ML IJ SOLN
INTRAMUSCULAR | Status: DC | PRN
  Administered 2014-09-14: 10 mg via INTRAVENOUS

## 2014-09-14 MED ORDER — SODIUM CHLORIDE 0.9 % IV SOLN: 1.0000 mL/kg/h | INTRAVENOUS | Status: DC

## 2014-09-14 MED ORDER — HYDROMORPHONE HCL 1 MG/ML IJ SOLN
INTRAMUSCULAR | Status: AC
  Filled 2014-09-14: qty 1

## 2014-09-14 SURGICAL SUPPLY — 14 items
CATH CROSS OVER TEMPO 5F (CATHETERS) ×3 IMPLANT
CATH OMNI FLUSH 5F 65CM (CATHETERS) ×3 IMPLANT
CATH SOFT-VU ST 4F 90CM (CATHETERS) ×3 IMPLANT
CATH STRAIGHT 5FR 65CM (CATHETERS) ×3 IMPLANT
HAND CONTROLLER AVANTA (MISCELLANEOUS) IMPLANT
KIT PV (KITS) ×3 IMPLANT
SET AVANTA MULTI PATIENT (MISCELLANEOUS) IMPLANT
SET AVANTA SINGLE PATIENT (MISCELLANEOUS) IMPLANT
SHEATH AVANTA HAND CONTROLLER (MISCELLANEOUS) IMPLANT
SHEATH PINNACLE 5F 10CM (SHEATH) ×3 IMPLANT
TRANSDUCER W/STOPCOCK (MISCELLANEOUS) ×3 IMPLANT
TRAY PV CATH (CUSTOM PROCEDURE TRAY) ×3 IMPLANT
WIRE HITORQ VERSACORE ST 145CM (WIRE) ×3 IMPLANT
WIRE VERSACORE LOC 115CM (WIRE) ×3 IMPLANT

## 2014-09-14 NOTE — Discharge Instructions (Signed)
Arteriogram °Care After °These instructions give you information on caring for yourself after your procedure. Your doctor may also give you more specific instructions. Call your doctor if you have any problems or questions after your procedure. °HOME CARE °· Do not bathe, swim, or use a hot tub until directed by your doctor. You can shower. °· Do not lift anything heavier than 10 pounds (about a gallon of milk) for 2 days. °· Do not walk a lot, run, or drive for 2 days. °· Return to normal activities in 2 days or as told by your doctor. °Finding out the results of your test °Ask when your test results will be ready. Make sure you get your test results. °GET HELP RIGHT AWAY IF:  °· You have fever. °· You have more pain in your leg. °· The leg that was cut is: °¨ Bleeding. °¨ Puffy (swollen) or red. °¨ Cold. °¨ Pale or changes color. °¨ Weak. °¨ Tingly or numb. °If you go to the Emergency Room, tell your nurse that you have had an arteriogram. Take this paper with you to show the nurse. °MAKE SURE YOU: °· Understand these instructions. °· Will watch your condition. °· Will get help right away if you are not doing well or get worse. °Document Released: 06/22/2008 Document Revised: 03/31/2013 Document Reviewed: 06/22/2008 °ExitCare® Patient Information ©2015 [employer]. This information is not intended to replace advice given to you by your health care provider. Make sure you discuss any questions you have with your health care provider. ° °

## 2014-09-14 NOTE — Interval H&P Note (Signed)
History and Physical Interval Note:  09/14/2014 7:31 AM  Craig Henderson  has presented today for surgery, with the diagnosis of claudication  The various methods of treatment have been discussed with the patient and family. After consideration of risks, benefits and other options for treatment, the patient has consented to  Procedure(s): Lower Extremity Angiography (N/A) and possible PTA as a surgical intervention .  The patient's history has been reviewed, patient examined, no change in status, stable for surgery.  I have reviewed the patient's chart and labs.  Questions were answered to the patient's satisfaction.   Procedure being observed by Ms. Elita Boone PA student and Dr.  Phill Myron ([facility] resident) and patient consents to this.   Adrian Prows

## 2014-09-18 ENCOUNTER — Other Ambulatory Visit: Payer: Self-pay | Admitting: Diagnostic Neuroimaging

## 2014-09-28 ENCOUNTER — Encounter (HOSPITAL_COMMUNITY): Payer: Self-pay | Admitting: Cardiology

## 2014-10-27 ENCOUNTER — Ambulatory Visit: Payer: Medicare Other | Admitting: Podiatry

## 2014-11-28 ENCOUNTER — Encounter (HOSPITAL_COMMUNITY): Payer: Self-pay | Admitting: Emergency Medicine

## 2014-11-28 ENCOUNTER — Emergency Department (HOSPITAL_COMMUNITY): Payer: Medicare Other

## 2014-11-28 ENCOUNTER — Inpatient Hospital Stay (HOSPITAL_COMMUNITY)
Admission: EM | Admit: 2014-11-28 | Discharge: 2014-12-02 | DRG: 699 | Disposition: A | Discharge status: Skilled Nursing Facility | Payer: Medicare Other | Attending: Internal Medicine | Admitting: Internal Medicine

## 2014-11-28 DIAGNOSIS — Z87891 Personal history of nicotine dependence: Secondary | ICD-10-CM | POA: Diagnosis not present

## 2014-11-28 DIAGNOSIS — R319 Hematuria, unspecified: Secondary | ICD-10-CM

## 2014-11-28 DIAGNOSIS — N401 Enlarged prostate with lower urinary tract symptoms: Secondary | ICD-10-CM | POA: Diagnosis present

## 2014-11-28 DIAGNOSIS — E440 Moderate protein-calorie malnutrition: Secondary | ICD-10-CM | POA: Diagnosis present

## 2014-11-28 DIAGNOSIS — B351 Tinea unguium: Secondary | ICD-10-CM | POA: Diagnosis present

## 2014-11-28 DIAGNOSIS — Z79891 Long term (current) use of opiate analgesic: Secondary | ICD-10-CM | POA: Diagnosis not present

## 2014-11-28 DIAGNOSIS — Y842 Radiological procedure and radiotherapy as the cause of abnormal reaction of the patient, or of later complication, without mention of misadventure at the time of the procedure: Secondary | ICD-10-CM | POA: Diagnosis present

## 2014-11-28 DIAGNOSIS — E11621 Type 2 diabetes mellitus with foot ulcer: Secondary | ICD-10-CM | POA: Diagnosis present

## 2014-11-28 DIAGNOSIS — N400 Enlarged prostate without lower urinary tract symptoms: Secondary | ICD-10-CM | POA: Diagnosis present

## 2014-11-28 DIAGNOSIS — N3041 Irradiation cystitis with hematuria: Principal | ICD-10-CM | POA: Diagnosis present

## 2014-11-28 DIAGNOSIS — E43 Unspecified severe protein-calorie malnutrition: Secondary | ICD-10-CM | POA: Insufficient documentation

## 2014-11-28 DIAGNOSIS — I4891 Unspecified atrial fibrillation: Secondary | ICD-10-CM | POA: Diagnosis present

## 2014-11-28 DIAGNOSIS — N3091 Cystitis, unspecified with hematuria: Secondary | ICD-10-CM | POA: Diagnosis present

## 2014-11-28 DIAGNOSIS — N39498 Other specified urinary incontinence: Secondary | ICD-10-CM | POA: Diagnosis present

## 2014-11-28 DIAGNOSIS — I4892 Unspecified atrial flutter: Secondary | ICD-10-CM | POA: Diagnosis present

## 2014-11-28 DIAGNOSIS — I10 Essential (primary) hypertension: Secondary | ICD-10-CM | POA: Diagnosis present

## 2014-11-28 DIAGNOSIS — E780 Pure hypercholesterolemia: Secondary | ICD-10-CM | POA: Diagnosis present

## 2014-11-28 DIAGNOSIS — L97509 Non-pressure chronic ulcer of other part of unspecified foot with unspecified severity: Secondary | ICD-10-CM | POA: Diagnosis present

## 2014-11-28 DIAGNOSIS — Z8546 Personal history of malignant neoplasm of prostate: Secondary | ICD-10-CM

## 2014-11-28 DIAGNOSIS — Z79899 Other long term (current) drug therapy: Secondary | ICD-10-CM

## 2014-11-28 DIAGNOSIS — Z6823 Body mass index (BMI) 23.0-23.9, adult: Secondary | ICD-10-CM | POA: Diagnosis not present

## 2014-11-28 DIAGNOSIS — F0390 Unspecified dementia without behavioral disturbance: Secondary | ICD-10-CM | POA: Diagnosis present

## 2014-11-28 DIAGNOSIS — E1165 Type 2 diabetes mellitus with hyperglycemia: Secondary | ICD-10-CM | POA: Diagnosis present

## 2014-11-28 DIAGNOSIS — Z7901 Long term (current) use of anticoagulants: Secondary | ICD-10-CM | POA: Diagnosis not present

## 2014-11-28 DIAGNOSIS — D62 Acute posthemorrhagic anemia: Secondary | ICD-10-CM | POA: Diagnosis present

## 2014-11-28 DIAGNOSIS — IMO0002 Reserved for concepts with insufficient information to code with codable children: Secondary | ICD-10-CM | POA: Diagnosis present

## 2014-11-28 LAB — BASIC METABOLIC PANEL
Anion gap: 9 (ref 5–15)
BUN: 20 mg/dL (ref 6–20)
CALCIUM: 8.9 mg/dL (ref 8.9–10.3)
CO2: 22 mmol/L (ref 22–32)
CREATININE: 1.27 mg/dL — AB (ref 0.61–1.24)
Chloride: 104 mmol/L (ref 101–111)
GFR calc Af Amer: 59 mL/min — ABNORMAL LOW (ref >60)
GFR, EST NON AFRICAN AMERICAN: 50 mL/min — AB (ref >60)
GLUCOSE: 169 mg/dL — AB (ref 65–99)
Potassium: 4.5 mmol/L (ref 3.5–5.1)
Sodium: 135 mmol/L (ref 135–145)

## 2014-11-28 LAB — I-STAT CHEM 8, ED
BUN: 24 mg/dL — AB (ref 6–20)
CREATININE: 1.2 mg/dL (ref 0.61–1.24)
Calcium, Ion: 1.12 mmol/L — ABNORMAL LOW (ref 1.13–1.30)
Chloride: 105 mmol/L (ref 101–111)
GLUCOSE: 166 mg/dL — AB (ref 65–99)
HCT: 33 % — ABNORMAL LOW (ref 39.0–52.0)
HEMOGLOBIN: 11.2 g/dL — AB (ref 13.0–17.0)
POTASSIUM: 4.1 mmol/L (ref 3.5–5.1)
Sodium: 137 mmol/L (ref 135–145)
TCO2: 20 mmol/L (ref 0–100)

## 2014-11-28 LAB — CBC WITH DIFFERENTIAL/PLATELET
BASOS ABS: 0 10*3/uL (ref 0.0–0.1)
Basophils Relative: 0 % (ref 0–1)
EOS PCT: 0 % (ref 0–5)
Eosinophils Absolute: 0 10*3/uL (ref 0.0–0.7)
HEMATOCRIT: 32.2 % — AB (ref 39.0–52.0)
Hemoglobin: 11.1 g/dL — ABNORMAL LOW (ref 13.0–17.0)
LYMPHS PCT: 9 % — AB (ref 12–46)
Lymphs Abs: 1.1 10*3/uL (ref 0.7–4.0)
MCH: 31.7 pg (ref 26.0–34.0)
MCHC: 34.5 g/dL (ref 30.0–36.0)
MCV: 92 fL (ref 78.0–100.0)
MONO ABS: 0.4 10*3/uL (ref 0.1–1.0)
MONOS PCT: 3 % (ref 3–12)
NEUTROS ABS: 10.9 10*3/uL — AB (ref 1.7–7.7)
Neutrophils Relative %: 88 % — ABNORMAL HIGH (ref 43–77)
PLATELETS: 227 10*3/uL (ref 150–400)
RBC: 3.5 MIL/uL — ABNORMAL LOW (ref 4.22–5.81)
RDW: 12.6 % (ref 11.5–15.5)
WBC: 12.5 10*3/uL — ABNORMAL HIGH (ref 4.0–10.5)

## 2014-11-28 LAB — TROPONIN I

## 2014-11-28 LAB — URINALYSIS, ROUTINE W REFLEX MICROSCOPIC
Glucose, UA: 100 mg/dL — AB
KETONES UR: 40 mg/dL — AB
NITRITE: POSITIVE — AB
PH: 6.5 (ref 5.0–8.0)
Protein, ur: >300 mg/dL — AB
SPECIFIC GRAVITY, URINE: 1.027 (ref 1.005–1.030)
UROBILINOGEN UA: 2 mg/dL — AB (ref 0.0–1.0)

## 2014-11-28 LAB — PROTIME-INR
INR: 2.64 — ABNORMAL HIGH (ref 0.00–1.49)
Prothrombin Time: 27.8 seconds — ABNORMAL HIGH (ref 11.6–15.2)

## 2014-11-28 LAB — ABO/RH: ABO/RH(D): O NEG

## 2014-11-28 LAB — URINE MICROSCOPIC-ADD ON

## 2014-11-28 LAB — GLUCOSE, CAPILLARY: Glucose-Capillary: 166 mg/dL — ABNORMAL HIGH (ref 65–99)

## 2014-11-28 LAB — POC OCCULT BLOOD, ED: FECAL OCCULT BLD: POSITIVE — AB

## 2014-11-28 MED ORDER — ATORVASTATIN CALCIUM 20 MG PO TABS
20.0000 mg | ORAL_TABLET | Freq: Every day | ORAL | Status: DC
Start: 2014-11-28 — End: 2014-12-02
  Administered 2014-11-29 – 2014-12-02 (×3): 20 mg via ORAL
  Filled 2014-11-28 (×4): qty 1

## 2014-11-28 MED ORDER — MEMANTINE HCL ER 28 MG PO CP24
28.0000 mg | ORAL_CAPSULE | Freq: Every day | ORAL | Status: DC
  Administered 2014-11-29 – 2014-12-02 (×4): 28 mg via ORAL
  Filled 2014-11-28 (×5): qty 1

## 2014-11-28 MED ORDER — METOPROLOL TARTRATE 25 MG PO TABS
25.0000 mg | ORAL_TABLET | Freq: Three times a day (TID) | ORAL | Status: DC
  Administered 2014-11-29 – 2014-12-02 (×10): 25 mg via ORAL
  Filled 2014-11-28 (×13): qty 1

## 2014-11-28 MED ORDER — OXYCODONE HCL 5 MG PO TABS
5.0000 mg | ORAL_TABLET | Freq: Once | ORAL | Status: DC
  Filled 2014-11-28 (×3): qty 1

## 2014-11-28 MED ORDER — ONDANSETRON HCL 4 MG/2ML IJ SOLN: 4.0000 mg | Freq: Four times a day (QID) | INTRAMUSCULAR | Status: DC | PRN

## 2014-11-28 MED ORDER — DONEPEZIL HCL 10 MG PO TABS
10.0000 mg | ORAL_TABLET | Freq: Every day | ORAL | Status: DC
  Administered 2014-11-30 – 2014-12-01 (×3): 10 mg via ORAL
  Filled 2014-11-28 (×3): qty 1

## 2014-11-28 MED ORDER — DEXTROSE 5 % IV SOLN
1.0000 g | Freq: Once | INTRAVENOUS | Status: AC
  Administered 2014-11-28: 1 g via INTRAVENOUS
  Filled 2014-11-28: qty 10

## 2014-11-28 MED ORDER — SODIUM CHLORIDE 0.9 % IV SOLN
INTRAVENOUS | Status: DC
  Administered 2014-12-01: 07:00:00 via INTRAVENOUS

## 2014-11-28 MED ORDER — INSULIN ASPART 100 UNIT/ML ~~LOC~~ SOLN
0.0000 [IU] | Freq: Three times a day (TID) | SUBCUTANEOUS | Status: DC
  Administered 2014-11-29 (×3): 2 [IU] via SUBCUTANEOUS
  Administered 2014-11-30 – 2014-12-01 (×3): 3 [IU] via SUBCUTANEOUS
  Administered 2014-12-01: 5 [IU] via SUBCUTANEOUS
  Administered 2014-12-01 – 2014-12-02 (×2): 3 [IU] via SUBCUTANEOUS
  Administered 2014-12-02: 5 [IU] via SUBCUTANEOUS
  Administered 2014-12-02: 3 [IU] via SUBCUTANEOUS

## 2014-11-28 MED ORDER — OXYCODONE HCL 5 MG PO TABS
5.0000 mg | ORAL_TABLET | ORAL | Status: DC | PRN
  Administered 2014-11-29 – 2014-12-01 (×2): 5 mg via ORAL
  Filled 2014-11-28 (×2): qty 1

## 2014-11-28 MED ORDER — ENSURE PO LIQD: 237.0000 mL | Freq: Three times a day (TID) | ORAL | Status: DC

## 2014-11-28 MED ORDER — PANTOPRAZOLE SODIUM 40 MG IV SOLR
40.0000 mg | Freq: Once | INTRAVENOUS | Status: AC
  Administered 2014-11-28: 40 mg via INTRAVENOUS
  Filled 2014-11-28: qty 40

## 2014-11-28 MED ORDER — INSULIN ASPART 100 UNIT/ML ~~LOC~~ SOLN: 0.0000 [IU] | Freq: Every day | SUBCUTANEOUS | Status: DC

## 2014-11-28 MED ORDER — LINAGLIPTIN 5 MG PO TABS
5.0000 mg | ORAL_TABLET | Freq: Every day | ORAL | Status: DC
  Administered 2014-11-29 – 2014-12-02 (×4): 5 mg via ORAL
  Filled 2014-11-28 (×4): qty 1

## 2014-11-28 MED ORDER — ENSURE ENLIVE PO LIQD
237.0000 mL | Freq: Three times a day (TID) | ORAL | Status: DC
  Administered 2014-11-29 – 2014-12-02 (×11): 237 mL via ORAL

## 2014-11-28 MED ORDER — ACETAMINOPHEN 650 MG RE SUPP: 650.0000 mg | Freq: Four times a day (QID) | RECTAL | Status: DC | PRN

## 2014-11-28 MED ORDER — DEXTROSE 5 % IV SOLN
1.0000 g | INTRAVENOUS | Status: DC
  Administered 2014-11-29 – 2014-11-30 (×2): 1 g via INTRAVENOUS
  Filled 2014-11-28 (×3): qty 10

## 2014-11-28 MED ORDER — ONDANSETRON HCL 4 MG PO TABS: 4.0000 mg | ORAL_TABLET | Freq: Four times a day (QID) | ORAL | Status: DC | PRN

## 2014-11-28 MED ORDER — ALUM & MAG HYDROXIDE-SIMETH 200-200-20 MG/5ML PO SUSP: 30.0000 mL | Freq: Four times a day (QID) | ORAL | Status: DC | PRN

## 2014-11-28 MED ORDER — ACETAMINOPHEN 325 MG PO TABS
650.0000 mg | ORAL_TABLET | Freq: Four times a day (QID) | ORAL | Status: DC | PRN
  Administered 2014-11-30 – 2014-12-01 (×2): 650 mg via ORAL
  Filled 2014-11-28 (×2): qty 2

## 2014-11-28 MED ORDER — FINASTERIDE 5 MG PO TABS: 5.0000 mg | ORAL_TABLET | Freq: Every day | ORAL | Status: DC

## 2014-11-28 MED ORDER — SODIUM CHLORIDE 0.9 % IV BOLUS (SEPSIS)
500.0000 mL | Freq: Once | INTRAVENOUS | Status: AC
  Administered 2014-11-28: 500 mL via INTRAVENOUS

## 2014-11-28 MED ORDER — LINAGLIPTIN 5 MG PO TABS: 5.0000 mg | ORAL_TABLET | Freq: Every day | ORAL | Status: DC

## 2014-11-28 MED ORDER — TRAZODONE HCL 50 MG PO TABS
50.0000 mg | ORAL_TABLET | Freq: Every day | ORAL | Status: DC
  Administered 2014-11-30 – 2014-12-01 (×3): 50 mg via ORAL
  Filled 2014-11-28 (×3): qty 1

## 2014-11-28 MED ORDER — SODIUM CHLORIDE 0.9 % IJ SOLN
3.0000 mL | Freq: Two times a day (BID) | INTRAMUSCULAR | Status: DC
  Administered 2014-11-29 – 2014-12-02 (×5): 3 mL via INTRAVENOUS

## 2014-11-28 MED ORDER — TAMSULOSIN HCL 0.4 MG PO CAPS
0.8000 mg | ORAL_CAPSULE | Freq: Every day | ORAL | Status: DC
  Administered 2014-11-30 – 2014-12-01 (×3): 0.8 mg via ORAL
  Filled 2014-11-28 (×3): qty 2

## 2014-11-28 NOTE — ED Notes (Signed)
Urology at bedside to place coude cath

## 2014-11-28 NOTE — ED Provider Notes (Signed)
CSN: [id number]     Arrival date & time 11/28/14  1100 History   First MD Initiated Contact with Patient 11/28/14 1113     Chief Complaint  Patient presents with  . Hematuria     (Consider location/radiation/quality/duration/timing/severity/associated sxs/prior Treatment) HPI Comments: Level V caveat for dementia. Patient brought by EMS with bright red blood in the toilet bowl after a bowel movement this morning. Denies any history of straining. Apparently blood is dripping from the rectum by report. No history of GI bleed. Record review shows patient is on xarelto. He denies any pain. No chest pain, shortness of breath or abdominal pain. Denies dizziness or lightheadedness. Denies fever or vomiting. He is unable to give a history due to his dementia.  The history is provided by the EMS personnel and the patient. The history is limited by the condition of the patient.    Past Medical History  Diagnosis Date  . Prostate cancer 07/18/11    bx=adenocarcinoma=PSA=12.45,gleason=4+5=9,4+3=7,4+4=8, 3+3=6 & 3+7,volume=156.35cc  . Diabetes mellitus   . Hypertension   . Hypercholesterolemia   . BPH (benign prostatic hyperplasia)     with nocturia,  . ED (erectile dysfunction)   . Hx of radiation therapy 10/23/11 - 12/18/11    prostate, seminal vesicles, lymph nodes  . Mild dementia    Past Surgical History  Procedure Laterality Date  . Prostate biopsy  07/18/2011    gleason 4+5=9  . Rotator cuff repair  2011  . Fracture surgery  35 years    right leg/rod placement  . Cardioversion N/A 05/11/2013    Procedure: CARDIOVERSION;  Surgeon: Laverda Page, MD;  Location: [facility];  Service: Cardiovascular;  Laterality: N/A;  . Peripheral vascular catheterization N/A 09/14/2014    Procedure: Lower Extremity Angiography;  Surgeon: Adrian Prows, MD;  Location: [facility];  Service: Cardiovascular;  Laterality: N/A;  . Femur fracture surgery      has metal rods both legs   Family History   Problem Relation Age of Onset  . Pneumonia Mother   . Cancer Father    Social History  Substance Use Topics  . Smoking status: Former Smoker -- 1.00 packs/day for 10 years    Types: Cigarettes    Quit date: 09/04/1972  . Smokeless tobacco: Never Used     Comment: quit 20 years ago  . Alcohol Use: 0.0 oz/week    0 Standard drinks or equivalent per week     Comment: not anymore    Review of Systems  Constitutional: Negative for fever, activity change and appetite change.  HENT: Negative for congestion and rhinorrhea.   Respiratory: Negative for cough, chest tightness and shortness of breath.   Cardiovascular: Negative for chest pain.  Gastrointestinal: Positive for blood in stool. Negative for nausea, vomiting and abdominal pain.  Genitourinary: Positive for hematuria. Negative for testicular pain.  Musculoskeletal: Positive for myalgias and arthralgias. Negative for back pain.  Skin: Negative for rash.  Neurological: Negative for dizziness, weakness and headaches.  A complete 10 system review of systems was obtained and all systems are negative except as noted in the HPI and PMH.      Allergies  Review of patient's allergies indicates no known allergies.  Home Medications   Prior to Admission medications   Medication Sig Start Date End Date Taking? Authorizing Provider  atorvastatin (LIPITOR) 20 MG tablet Take 20 mg by mouth daily.   Yes Historical Provider, MD  clotrimazole-betamethasone (LOTRISONE) cream Apply 1 application topically daily  as needed (wound care).  09/21/14  Yes Historical Provider, MD  donepezil (ARICEPT) 10 MG tablet TAKE 1 TABLET BY MOUTH EVERY DAY AT BEDTIME 07/29/14  Yes Vikram R Penumalli, MD  ENSURE (ENSURE) Take 237 mLs by mouth 3 (three) times daily.    Yes Historical Provider, MD  Iron-FA-B Cmp-C-Biot-Probiotic (FUSION PLUS) CAPS Take 1 capsule by mouth daily.   Yes Historical Provider, MD  Maltodextrin-Xanthan Gum ([place]) POWD  Use as directed for nectar thick liquids during meals Patient taking differently: Take by mouth See admin instructions. Use as directed for nectar thick liquids during meals 06/24/14  Yes Annita Brod, MD  metoprolol tartrate (LOPRESSOR) 25 MG tablet Take 1 tablet (25 mg total) by mouth 3 (three) times daily. 06/24/14  Yes Annita Brod, MD  oxyCODONE (OXY IR/ROXICODONE) 5 MG immediate release tablet Take 1 tablet (5 mg total) by mouth once. Patient taking differently: Take 5 mg by mouth daily as needed (pain).  06/24/14  Yes Annita Brod, MD  rivaroxaban (XARELTO) 20 MG TABS tablet Take 1 tablet (20 mg total) by mouth daily with supper. Patient taking differently: Take 20 mg by mouth at bedtime.  09/15/14  Yes Adrian Prows, MD  sitaGLIPtin (JANUVIA) 100 MG tablet Take 100 mg by mouth daily.   Yes Historical Provider, MD  tamsulosin (FLOMAX) 0.4 MG CAPS capsule Take 0.4 mg by mouth at bedtime.  05/17/14  Yes Historical Provider, MD  Travoprost, BAK Free, (TRAVATAN) 0.004 % SOLN ophthalmic solution Place 1 drop into both eyes at bedtime.   Yes Historical Provider, MD  traZODone (DESYREL) 50 MG tablet Take 50 mg by mouth at bedtime as needed for sleep.    Yes Historical Provider, MD  NAMENDA XR 28 MG CP24 24 hr capsule TAKE ONE CAPSULE BY MOUTH EVERY DAY--NEEDS APPT 06/27/14   Penni Bombard, MD  silver sulfADIAZINE (SILVADENE) 1 % cream Apply 1 application topically daily. Patient not taking: Reported on 11/28/2014 07/30/14   Trula Slade, DPM   BP 137/68 mmHg  Pulse 71  Temp(Src) 98.1 F (36.7 C) (Oral)  Resp 15  SpO2 100% Physical Exam  Constitutional: He is oriented to person, place, and time. He appears well-developed and well-nourished. No distress.  HENT:  Head: Normocephalic and atraumatic.  Mouth/Throat: Oropharynx is clear and moist. No oropharyngeal exudate.  Eyes: Conjunctivae and EOM are normal. Pupils are equal, round, and reactive to light.  Neck: Normal range of  motion. Neck supple.  No meningismus.  Cardiovascular: Normal rate, regular rhythm, normal heart sounds and intact distal pulses.   No murmur heard. Pulmonary/Chest: Effort normal and breath sounds normal. No respiratory distress.  Abdominal: Soft. There is no tenderness. There is no rebound and no guarding.  Genitourinary:  Brown stool, Hemoccult-positive. Gross blood in diaper and on bedsheets. Gross bloody urine coming from penis with clots. Testicles are nontender.  Musculoskeletal: Normal range of motion. He exhibits no edema or tenderness.  Neurological: He is alert and oriented to person, place, and time. No cranial nerve deficit. He exhibits normal muscle tone. Coordination normal.  Moving all extremities, follows some commands  Skin: Skin is warm.  Psychiatric: He has a normal mood and affect. His behavior is normal.  Nursing note and vitals reviewed.   ED Course  Procedures (including critical care time) Labs Review Labs Reviewed  CBC WITH DIFFERENTIAL/PLATELET - Abnormal; Notable for the following:    WBC 12.5 (*)    RBC 3.50 (*)  Hemoglobin 11.1 (*)    HCT 32.2 (*)    Neutrophils Relative % 88 (*)    Neutro Abs 10.9 (*)    Lymphocytes Relative 9 (*)    All other components within normal limits  BASIC METABOLIC PANEL - Abnormal; Notable for the following:    Glucose, Bld 169 (*)    Creatinine, Ser 1.27 (*)    GFR calc non Af Amer 50 (*)    GFR calc Af Amer 59 (*)    All other components within normal limits  PROTIME-INR - Abnormal; Notable for the following:    Prothrombin Time 27.8 (*)    INR 2.64 (*)    All other components within normal limits  URINALYSIS, ROUTINE W REFLEX MICROSCOPIC (NOT AT [facility]) - Abnormal; Notable for the following:    Color, Urine RED (*)    APPearance TURBID (*)    Glucose, UA 100 (*)    Hgb urine dipstick LARGE (*)    Bilirubin Urine LARGE (*)    Ketones, ur 40 (*)    Protein, ur >300 (*)    Urobilinogen, UA 2.0 (*)    Nitrite  POSITIVE (*)    Leukocytes, UA LARGE (*)    All other components within normal limits  URINE MICROSCOPIC-ADD ON - Abnormal; Notable for the following:    Bacteria, UA MANY (*)    All other components within normal limits  I-STAT CHEM 8, ED - Abnormal; Notable for the following:    BUN 24 (*)    Glucose, Bld 166 (*)    Calcium, Ion 1.12 (*)    Hemoglobin 11.2 (*)    HCT 33.0 (*)    All other components within normal limits  POC OCCULT BLOOD, ED - Abnormal; Notable for the following:    Fecal Occult Bld POSITIVE (*)    All other components within normal limits  URINE CULTURE  TROPONIN I  BASIC METABOLIC PANEL  CBC  TYPE AND SCREEN  ABO/RH    Imaging Review Ct Renal Stone Study  11/28/2014   CLINICAL DATA:  Hematuria.  Initial encounter.  Prostate cancer.  EXAM: CT ABDOMEN AND PELVIS WITHOUT CONTRAST  TECHNIQUE: Multidetector CT imaging of the abdomen and pelvis was performed following the standard protocol without IV contrast.  COMPARISON:  CT 10/02/2013.  FINDINGS: Unenhanced CT was performed per clinician order. Lack of IV contrast limits sensitivity and specificity, especially for evaluation of abdominal/pelvic solid viscera.  Musculoskeletal: No aggressive osseous lesions. Grade I anterolisthesis of L4 on L5. Lower lumbar facet arthrosis. No sclerotic lesions to suggest bony metastatic disease in this patient with history of prostate cancer. Posttraumatic changes with removed hardware from the proximal LEFT femur.  Lung Bases: Linear scarring/ atelectasis at the lung bases. Calcified granuloma in the anterior LEFT lower lobe.  Liver:  Grossly within normal limits.  Spleen:  Normal.  Gallbladder:  Contracted.  No calcified stones.  Common bile duct:  Normal.  Pancreas:  Normal.  Adrenal glands: Bilateral adrenal thickening compatible with hyperplasia.  Kidneys: Mild ureteral ectasia and minimal dilation of the collecting systems. No calyceal dilation. No renal calculi. Bilateral ureteral  ectasia.  Stomach:  Grossly normal.  Small bowel:  Grossly normal.  Colon: Normal appendix. Colonic diverticulosis without diverticulitis.  Pelvic Genitourinary: Foley catheter in the urinary bladder. Large amount of clot is present in the urinary bladder with heterogeneous high attenuation on this noncontrast CT. Iatrogenic air is also present in the urinary bladder. There is mural thickening for  the degree of distension which is probably secondary to chronic bladder outflow obstruction.  Prostatomegaly is present. Prostate calcifications demarcate the bladder base from the enlarged prostate gland which produces an impression on the inferior bladder. Prostate brachytherapy seeds are present.  Peritoneum: No intra-abdominal free air.  Vascular/lymphatic: Atherosclerosis. No gross acute vascular abnormality allowing for noncontrast technique.  Body Wall: Patient is cachectic.  IMPRESSION: 1. Large amount of clot in the urinary bladder producing distension. 2. Foley catheter position in the urinary bladder with iatrogenic air. 3. Prostatomegaly with prostate brachytherapy seeds. 4. Very mild hydroureteronephrosis, probably secondary to clot in the bladder.   Electronically Signed   By: Dereck Ligas M.D.   On: 11/28/2014 14:55   I have personally reviewed and evaluated these images and lab results as part of my medical decision-making.   EKG Interpretation   Date/Time:  Sunday November 28 2014 11:26:28 EDT Ventricular Rate:  60 PR Interval:    QRS Duration: 85 QT Interval:  435 QTC Calculation: 435 R Axis:   72 Text Interpretation:  Junctional rhythm no acute ST changes Confirmed by  Wyvonnia Dusky  MD, [place] ([id number]) on 11/28/2014 11:31:26 AM      MDM   Final diagnoses:  Hematuria   Patient appears to be having hematuria not grossly bloody rectal bleeding. He does have brown stools are guaiac positive but this could be easily contaminated by his hematuria.  Chart review shows history of prostate  issues. He is on xarelto. Vitals are stable. No tachycardia. No hypotension.  Discussed with Dr. Eulogio Ditch urology. He agrees with Foley catheter placement and irrigation.  Foley catheter placed.  Irrigation done with evacaution of some clots.  UA with infection. Suspect hemorrhagic cystitis.  Rocephin given.  xarelto needs to be held.   Hemoglobin at baseline. Vitals stable in the ED.  With ongoing bleeding and anticoagulant use, will need to admit.  D/w Dr. Coralyn Pear. BP 137/68 mmHg  Pulse 71  Temp(Src) 98.1 F (36.7 C) (Oral)  Resp 15  SpO2 100%    Ezequiel Essex, MD 11/28/14 1743

## 2014-11-28 NOTE — ED Notes (Signed)
Patient adult diaper saturated with blood including clots. Rectal exam had brown stool. During cleaning, patient began to pee bright red bloody urine. Blood coming from penis, not rectum.

## 2014-11-28 NOTE — ED Notes (Signed)
Pt returns from xray

## 2014-11-28 NOTE — ED Notes (Signed)
DR.Dahlstedt at bedside for foley placement and irrigation.

## 2014-11-28 NOTE — ED Notes (Signed)
Notified Dr. Wyvonnia Dusky of site of bleeding as penis not rectum. Rancour at bedside.

## 2014-11-28 NOTE — H&P (Signed)
[facility] History and Physical  Craig Henderson PIR:[id number] DOB: [birth year] DOA: 11/28/2014  Referring physician:  PCP: Elyn Peers, MD   Chief Complaint: Hematuria  HPI: Craig Henderson is a 81 y.o. male is a pleasant 81 year old gentleman with a past medical history of prostate cancer treated with radiation therapy 2013, history of advanced dementia who requires assistance with most activities of daily living at baseline, history of atrial flutter on anticoagulation with Xarelto, status post cardioversion on 05/11/2013, who was brought to the emergency department by family members for hematuria. Given advanced dementia history is limited. History was obtained from family members and emergency room staff. Family members noted red blood in the toilet after having a bowel movement this morning. In the emergency department adult pads were saturated with blood clots. Rectal exam showed brown stool, however he was noted to have gross hematuria by emergency room staff. Urology was consulted as patient was seen and evaluated by Dr Diona Fanti.    Review of Systems:  Could not obtain reliable review of systems given advanced dementia.   Past Medical History  Diagnosis Date  . Prostate cancer 07/18/11    bx=adenocarcinoma=PSA=12.45,gleason=4+5=9,4+3=7,4+4=8, 3+3=6 & 3+7,volume=156.35cc  . Diabetes mellitus   . Hypertension   . Hypercholesterolemia   . BPH (benign prostatic hyperplasia)     with nocturia,  . ED (erectile dysfunction)   . Hx of radiation therapy 10/23/11 - 12/18/11    prostate, seminal vesicles, lymph nodes  . Mild dementia    Past Surgical History  Procedure Laterality Date  . Prostate biopsy  07/18/2011    gleason 4+5=9  . Rotator cuff repair  2011  . Fracture surgery  35 years    right leg/rod placement  . Cardioversion N/A 05/11/2013    Procedure: CARDIOVERSION;  Surgeon: Laverda Page, MD;  Location: [facility];  Service: Cardiovascular;  Laterality:  N/A;  . Peripheral vascular catheterization N/A 09/14/2014    Procedure: Lower Extremity Angiography;  Surgeon: Adrian Prows, MD;  Location: [facility];  Service: Cardiovascular;  Laterality: N/A;   Social History:  reports that he quit smoking about 42 years ago. His smoking use included Cigarettes. He has a 10 pack-year smoking history. He has never used smokeless tobacco. He reports that he drinks alcohol. He reports that he does not use illicit drugs.  No Known Allergies  Family History  Problem Relation Age of Onset  . Pneumonia Mother   . Cancer Father     Prior to Admission medications   Medication Sig Start Date End Date Taking? Authorizing Provider  atorvastatin (LIPITOR) 20 MG tablet Take 20 mg by mouth daily.    Historical Provider, MD  donepezil (ARICEPT) 10 MG tablet TAKE 1 TABLET BY MOUTH EVERY DAY AT BEDTIME 07/29/14   Vikram R Penumalli, MD  ENSURE (ENSURE) Take 237 mLs by mouth 3 (three) times daily.     Historical Provider, MD  finasteride (PROSCAR) 5 MG tablet Take 1 tablet by mouth daily. 06/13/14   Historical Provider, MD  Iron-FA-B Cmp-C-Biot-Probiotic (FUSION PLUS) CAPS Take 1 capsule by mouth daily.    Historical Provider, MD  levofloxacin (LEVAQUIN) 500 MG tablet Take 1 tablet (500 mg total) by mouth daily. 06/24/14   Annita Brod, MD  Maltodextrin-Xanthan Gum (RESOURCE THICKENUP CLEAR) POWD Use as directed for nectar thick liquids during meals 06/24/14   Annita Brod, MD  metoprolol tartrate (LOPRESSOR) 25 MG tablet Take 1 tablet (25 mg total) by mouth 3 (three) times  daily. 06/24/14   Annita Brod, MD  NAMENDA XR 28 MG CP24 24 hr capsule TAKE ONE CAPSULE BY MOUTH EVERY DAY--NEEDS APPT 06/27/14   Penni Bombard, MD  oxyCODONE (OXY IR/ROXICODONE) 5 MG immediate release tablet Take 1 tablet (5 mg total) by mouth once. 06/24/14   Annita Brod, MD  rivaroxaban (XARELTO) 20 MG TABS tablet Take 1 tablet (20 mg total) by mouth daily with supper. 09/15/14    Adrian Prows, MD  SANTYL ointment Apply 1 application topically daily.  08/12/14   Historical Provider, MD  silver sulfADIAZINE (SILVADENE) 1 % cream Apply 1 application topically daily. 07/30/14   Trula Slade, DPM  sitaGLIPtin (JANUVIA) 100 MG tablet Take 100 mg by mouth daily.    Historical Provider, MD  tamsulosin (FLOMAX) 0.4 MG CAPS capsule Take 0.8 mg by mouth at bedtime. 05/17/14   Historical Provider, MD  Travoprost, BAK Free, (TRAVATAN) 0.004 % SOLN ophthalmic solution Place 1 drop into both eyes at bedtime.    Historical Provider, MD  traZODone (DESYREL) 50 MG tablet Take 50 mg by mouth at bedtime.    Historical Provider, MD   Physical Exam: Filed Vitals:   11/28/14 1215 11/28/14 1230 11/28/14 1245 11/28/14 1345  BP: 120/68 144/73 118/61 125/53  Pulse: 54 63 62 73  Temp:      TempSrc:      Resp: 15 14 16 18   SpO2: 100% 100% 100% 100%    Wt Readings from Last 3 Encounters:  09/14/14 68.947 kg (152 lb)  09/07/14 68.947 kg (152 lb)  07/27/14 68.947 kg (152 lb)    General:  Patient is awake and alert however confused disoriented, cannot tell me where he is. He does not appear to be in acute distress. He is thin and chronically ill-appearing. Eyes: PERRL, normal lids, irises & conjunctiva ENT: grossly normal hearing, lips & tongue Neck: no LAD, masses or thyromegaly Cardiovascular: RRR, no m/r/g. No LE edema. Telemetry: SR, no arrhythmias  Respiratory: CTA bilaterally, no w/r/r. Normal respiratory effort. Abdomen: soft, ntnd Skin: no rash or induration seen on limited exam Musculoskeletal: Significant bilateral muscle atrophy. Psychiatric: He is confused and disoriented Neurologic: grossly non-focal.          Labs on Admission:  Basic Metabolic Panel:  Recent Labs Lab 11/28/14 1149 11/28/14 1202  NA 135 137  K 4.5 4.1  CL 104 105  CO2 22  --   GLUCOSE 169* 166*  BUN 20 24*  CREATININE 1.27* 1.20  CALCIUM 8.9  --    Liver Function Tests: No results for  input(s): AST, ALT, ALKPHOS, BILITOT, PROT, ALBUMIN in the last 168 hours. No results for input(s): LIPASE, AMYLASE in the last 168 hours. No results for input(s): AMMONIA in the last 168 hours. CBC:  Recent Labs Lab 11/28/14 1149 11/28/14 1202  WBC 12.5*  --   NEUTROABS 10.9*  --   HGB 11.1* 11.2*  HCT 32.2* 33.0*  MCV 92.0  --   PLT 227  --    Cardiac Enzymes:  Recent Labs Lab 11/28/14 1149  TROPONINI <0.03    BNP (last 3 results) No results for input(s): BNP in the last 8760 hours.  ProBNP (last 3 results) No results for input(s): PROBNP in the last 8760 hours.  CBG: No results for input(s): GLUCAP in the last 168 hours.  Radiological Exams on Admission: No results found.  EKG: Independently reviewed.   Assessment/Plan Principal Problem:   Hemorrhagic cystitis Active Problems:  Diabetes mellitus type 2, uncontrolled   Diabetic foot ulcer   Dementia   Moderate malnutrition   BPH (benign prostatic hyperplasia)   1. Gross hematuria. Patient having a history of prostate cancer which was treated with radiation therapy back in 2013. Last PSA was checked on 03/08/2012, coming back at 0.52. Patient had been on chronic anticoagulation therapy with Xarelto for history of atrial flutter. Suspect patient may be having hemorrhagic cystitis that was aggravated by anticoagulant. Initial labs showing a stable hemoglobin of 11.2. He was seen and evaluated by urology in the emergency department. Urinary catheter has been placed. Will treat with ceftriaxone 1 g IV every 24 hours. A CT scan of abdomen and pelvis has been done while in the emergency department as results are pending at the time of this dictation. Will monitor hemoglobin, admit to the inpatient service. 2. Urinary tract infection. Patient presenting with gross hematuria, urinalysis showing the presence of many bacteria along with nitrates and leukocytes. He had been on anticoagulation therapy for atrial flutter. Will  treat urinary tract infection with ceftriaxone 1 g IV every 24 hours, follow-up on urinary cultures. 3. Advanced dementia. Continue Aricept and Namenda. Spoke with family members, patient at baseline has advanced dementia and requires assistance with most activities of daily living. Will consult physical therapy. 4. Protein calorie malnutrition. Will continue ensure 5. Type 2 diabetes mellitus. Will perform Accu-Cheks AC and HS with sliding scale coverage. Continue home regimen with Januvia 6. BPH. Continue Proscar and Flomax. 7. Hypertension. Will continue metoprolol 25 mg by mouth 3 times a day 8. History of atrial flutter. Patient presenting with hemorrhagic cystitis for which his Xarelto will be discontinued. He is rate controlled. Continue metoprolol 25 mg by mouth 3 times a day 9. DVT prophylaxis. SCDs    Code Status: CODE STATUS discussed with family members he is a full code Family Communication: I spoke with his wife and 2 daughters Disposition Plan: Will admit patient to the inpatient service, anticipate he'll require greater than 2 night hospitalization  Time spent: 70 min  Kelvin Cellar [facility] Pager [phone]

## 2014-11-28 NOTE — ED Notes (Signed)
Foley irrigated with irrigation tray and sterile water, 500 cc used to irrigate, blood clotes noted and dark red blood. After serial irrigations, bright red blood noted.

## 2014-11-28 NOTE — ED Notes (Signed)
Bright red blood in toilet after a BM this morning; denies straining or known hemorrhoids. Blood continues to drain from rectum after getting off toilet per patient. Denies abd pain, N/V/D.

## 2014-11-28 NOTE — Consult Note (Signed)
Urology Consult  Consulting MD: Rancour  CC: Gross hematuria  HPI: This is a 81 year old male who presented to the emergency room with new onset of gross hematuria. The patient does have cognitive dysfunction. He comes in with his family who are quite supportive and answer for him. He is followed in her office by Dr. Lowella Bandy. He was diagnosed with adenocarcinoma the prostate in April, 2013. At that time, he did very large prostate, PSA was 12.5. His large volume prostate cancer, high-grade with several biopsies being Gleason 4+5 = 9 pattern. He underwent IMR T as well as a year of an urgent deprivation therapy. His last PSA in our office was under 0.1.  The patient is on antiplatelets therapy.  The patient apparently has not had any regular urinary issues until recently, when he was noted to have gross hematuria and incontinence. He presented to the emergency room today with that. The patient is soft-spoken, and is not a good historian.  PMH: Past Medical History  Diagnosis Date  . Prostate cancer 07/18/11    bx=adenocarcinoma=PSA=12.45,gleason=4+5=9,4+3=7,4+4=8, 3+3=6 & 3+7,volume=156.35cc  . Diabetes mellitus   . Hypertension   . Hypercholesterolemia   . BPH (benign prostatic hyperplasia)     with nocturia,  . ED (erectile dysfunction)   . Hx of radiation therapy 10/23/11 - 12/18/11    prostate, seminal vesicles, lymph nodes  . Mild dementia     PSH: Past Surgical History  Procedure Laterality Date  . Prostate biopsy  07/18/2011    gleason 4+5=9  . Rotator cuff repair  2011  . Fracture surgery  35 years    right leg/rod placement  . Cardioversion N/A 05/11/2013    Procedure: CARDIOVERSION;  Surgeon: Laverda Page, MD;  Location: [place];  Service: Cardiovascular;  Laterality: N/A;  . Peripheral vascular catheterization N/A 09/14/2014    Procedure: Lower Extremity Angiography;  Surgeon: Adrian Prows, MD;  Location: [place] [facility];  Service: Cardiovascular;  Laterality:  N/A;    Allergies: No Known Allergies  Medications:  (Not in a hospital admission)   Social History: Social History   Social History  . Marital Status: Married    Spouse Name: Florine  . Number of Children: 5  . Years of Education: College   Occupational History  . [employer]     retired   Social History Main Topics  . Smoking status: Former Smoker -- 1.00 packs/day for 10 years    Types: Cigarettes    Quit date: 09/04/1972  . Smokeless tobacco: Never Used     Comment: quit 20 years ago  . Alcohol Use: 0.0 oz/week    0 Standard drinks or equivalent per week     Comment: not anymore  . Drug Use: No  . Sexual Activity: Not on file   Other Topics Concern  . Not on file   Social History Narrative   Patient lives at home with spouse.   Caffeine Use: 1 soda daily    Family History: Family History  Problem Relation Age of Onset  . Pneumonia Mother   . Cancer Father     Review of Systems: Positive: Gross hematuria, decreased appetite, abdominal discomfort. Negative: No fevers, chills, nausea or vomiting.  A further 10 point review of systems was negative except what is listed in the HPI.  Physical Exam: @VITALS2 @ General: No acute distress.  Awake. He is somewhat lethargic. Head:  Normocephalic.  Atraumatic. There is temporal wasting. ENT:  EOMI.  Mucous  membranes moist Neck:  Supple.  No lymphadenopathy. CV:   Regular rate. Pulmonary: Equal effort bilaterally.  Clear to auscultation bilaterally. Abdomen: Soft. Non- tender to palpation. Scaphoid, no masses. Skin:  Normal turgor.  No visible rash. Extremity: No gross deformity of bilateral upper extremities.  No gross deformity of    bilateral lower extremities. Neurologic: Alert. Appropriate mood.  Penis:  circumcised.  No lesions. Incontinent of bloody urine Urethra: No Foley catheter in place.  Orthotopic meatus. Scrotum: No lesions.  No ecchymosis.  No erythema. Testicles: Descended bilaterally.  No masses  bilaterally. Epididymis: Palpable bilaterally.Non Tender to palpation.  Studies:  Recent Labs     11/28/14  1149  11/28/14  1202  HGB  11.1*  11.2*  WBC  12.5*   --   PLT  227   --     Recent Labs     11/28/14  1202  NA  137  K  4.1  CL  105  BUN  24*  CREATININE  1.20     Recent Labs     11/28/14  1149  INR  2.64*     Invalid input(s): ABG  I reviewed the patient's laboratory studies.  After sterile prep and drape, I introduced a 20 French Foley catheter. This made it through the prostate, although it was somewhat of a difficult catheterization. Approximately 150 mL of bloody urine were obtained. I irrigated with 500 mL of saline until clear. Approximately 100 mL of clots were evacuated. The balloon was then filled with 30 mL of water-the patient has pulled catheters out before. It was then hooked to dependent drainage.  Assessment:  1. Gross hematuria. The patient does have a large prostate, as well as a history of radiotherapy for prostate cancer. This may be hemorrhagic cystitis versus 9 prostatic leading versus bacterial cystitis with hemorrhage. Currently, hemoglobin is 11.2  2. Prostate cancer, high-grade, status post IM RT in 2013. Most recent PSA in our office revealed no evidence of recurrence.  Plan: 1. I would leave catheter to drainage, but irrigate with Toomey syringe in/out every 4 hours and when necessary. Hopefully, this will allow the bleeding to stop and no anesthetic intervention will be necessary. The patient may eventually need a 3 way catheter with continuous bladder irrigation, however.  2. Serial hemoglobins  3. I would recommend stopping patient's anticoagulatant currently  4. The patient sees Dr. Alfredo Bach will notify him of the patient's admission  5. I would recommend that the patient be admitted to the medical service for his other medical issues.  6. Culture urine  CC: Dr.Rancour     Pager:[phone]

## 2014-11-29 LAB — HEMOGLOBIN AND HEMATOCRIT, BLOOD
HEMATOCRIT: 26.9 % — AB (ref 39.0–52.0)
HEMOGLOBIN: 9.4 g/dL — AB (ref 13.0–17.0)

## 2014-11-29 LAB — BASIC METABOLIC PANEL
Anion gap: 8 (ref 5–15)
BUN: 27 mg/dL — AB (ref 6–20)
CHLORIDE: 110 mmol/L (ref 101–111)
CO2: 22 mmol/L (ref 22–32)
Calcium: 8.4 mg/dL — ABNORMAL LOW (ref 8.9–10.3)
Creatinine, Ser: 1.68 mg/dL — ABNORMAL HIGH (ref 0.61–1.24)
GFR calc Af Amer: 42 mL/min — ABNORMAL LOW (ref >60)
GFR calc non Af Amer: 36 mL/min — ABNORMAL LOW (ref >60)
GLUCOSE: 157 mg/dL — AB (ref 65–99)
POTASSIUM: 4.8 mmol/L (ref 3.5–5.1)
SODIUM: 140 mmol/L (ref 135–145)

## 2014-11-29 LAB — CBC
HEMATOCRIT: 21.3 % — AB (ref 39.0–52.0)
Hemoglobin: 7.4 g/dL — ABNORMAL LOW (ref 13.0–17.0)
MCH: 32.3 pg (ref 26.0–34.0)
MCHC: 34.7 g/dL (ref 30.0–36.0)
MCV: 93 fL (ref 78.0–100.0)
Platelets: 194 10*3/uL (ref 150–400)
RBC: 2.29 MIL/uL — ABNORMAL LOW (ref 4.22–5.81)
RDW: 12.8 % (ref 11.5–15.5)
WBC: 20.9 10*3/uL — AB (ref 4.0–10.5)

## 2014-11-29 LAB — GLUCOSE, CAPILLARY
GLUCOSE-CAPILLARY: 148 mg/dL — AB (ref 65–99)
Glucose-Capillary: 132 mg/dL — ABNORMAL HIGH (ref 65–99)
Glucose-Capillary: 121 mg/dL — ABNORMAL HIGH (ref 65–99)

## 2014-11-29 LAB — URINE CULTURE: Culture: 3000

## 2014-11-29 LAB — PREPARE RBC (CROSSMATCH)

## 2014-11-29 MED ORDER — MUPIROCIN CALCIUM 2 % EX CREA
TOPICAL_CREAM | Freq: Every day | CUTANEOUS | Status: DC
  Administered 2014-11-29 – 2014-12-02 (×4): via TOPICAL
  Filled 2014-11-29: qty 15

## 2014-11-29 MED ORDER — SODIUM CHLORIDE 0.9 % IV SOLN
Freq: Once | INTRAVENOUS | Status: AC
  Administered 2014-11-29: 12:00:00 via INTRAVENOUS

## 2014-11-29 NOTE — Progress Notes (Signed)
[facility] PROGRESS NOTE  Craig Henderson UMP:[id number] DOB: [birth year] DOA: 11/28/2014 PCP: Elyn Peers, MD  Assessment/Plan: Principal Problem:   Hemorrhagic cystitis - Urology on board -Continue IV antibiotics -Continue monitor hemoglobin levels -Transfused 2 units of packed red blood cells  Active Problems:   Diabetes mellitus type 2, uncontrolled - Continue current diabetic regimen    Dementia - Stable continue Namenda and Aricept    Moderate malnutrition - Pt on Ensure    BPH (benign prostatic hyperplasia) - stable continue flomax  Code Status: full Family Communication: Discussed directly with patient Disposition Plan: Pending improvement in condition and further recommendations from neurologist   Consultants:  Urology   Antibiotics:  Ceftriaxone  HPI/Subjective: Pt has no new complaints. No acute issues reported overnight.  Objective: Filed Vitals:   11/29/14 1407  BP: 115/49  Pulse: 87  Temp: 98.7 F (37.1 C)  Resp: 18    Intake/Output Summary (Last 24 hours) at 11/29/14 1533 Last data filed at 11/29/14 1459  Gross per 24 hour  Intake   2825 ml  Output   1965 ml  Net    860 ml   Filed Weights   11/28/14 2100  Weight: 58.423 kg (128 lb 12.8 oz)    Exam:   General:  Pt in nad, alert and awake  Cardiovascular: rrr, no mrg  Respiratory: cta bl, no wheezes  Abdomen: soft, no guarding  Musculoskeletal: equal tone BL   Data Reviewed: Basic Metabolic Panel:  Recent Labs Lab 11/28/14 1149 11/28/14 1202 11/29/14 0440  NA 135 137 140  K 4.5 4.1 4.8  CL 104 105 110  CO2 22  --  22  GLUCOSE 169* 166* 157*  BUN 20 24* 27*  CREATININE 1.27* 1.20 1.68*  CALCIUM 8.9  --  8.4*   Liver Function Tests: No results for input(s): AST, ALT, ALKPHOS, BILITOT, PROT, ALBUMIN in the last 168 hours. No results for input(s): LIPASE, AMYLASE in the last 168 hours. No results for input(s): AMMONIA in the last 168  hours. CBC:  Recent Labs Lab 11/28/14 1149 11/28/14 1202 11/29/14 0440  WBC 12.5*  --  20.9*  NEUTROABS 10.9*  --   --   HGB 11.1* 11.2* 7.4*  HCT 32.2* 33.0* 21.3*  MCV 92.0  --  93.0  PLT 227  --  194   Cardiac Enzymes:  Recent Labs Lab 11/28/14 1149  TROPONINI <0.03   BNP (last 3 results) No results for input(s): BNP in the last 8760 hours.  ProBNP (last 3 results) No results for input(s): PROBNP in the last 8760 hours.  CBG:  Recent Labs Lab 11/28/14 2153 11/29/14 0751 11/29/14 1112  GLUCAP 166* 132* 121*    Recent Results (from the past 240 hour(s))  Urine culture     Status: None   Collection Time: 11/28/14 11:30 AM  Result Value Ref Range Status   Specimen Description URINE, CLEAN CATCH  Final   Special Requests NONE  Final   Culture 3,000 COLONIES/mL INSIGNIFICANT GROWTH  Final   Report Status 11/29/2014 FINAL  Final     Studies: Ct Renal Stone Study  11/28/2014   CLINICAL DATA:  Hematuria.  Initial encounter.  Prostate cancer.  EXAM: CT ABDOMEN AND PELVIS WITHOUT CONTRAST  TECHNIQUE: Multidetector CT imaging of the abdomen and pelvis was performed following the standard protocol without IV contrast.  COMPARISON:  CT 10/02/2013.  FINDINGS: Unenhanced CT was performed per clinician order. Lack of IV contrast limits sensitivity and specificity, especially  for evaluation of abdominal/pelvic solid viscera.  Musculoskeletal: No aggressive osseous lesions. Grade I anterolisthesis of L4 on L5. Lower lumbar facet arthrosis. No sclerotic lesions to suggest bony metastatic disease in this patient with history of prostate cancer. Posttraumatic changes with removed hardware from the proximal LEFT femur.  Lung Bases: Linear scarring/ atelectasis at the lung bases. Calcified granuloma in the anterior LEFT lower lobe.  Liver:  Grossly within normal limits.  Spleen:  Normal.  Gallbladder:  Contracted.  No calcified stones.  Common bile duct:  Normal.  Pancreas:  Normal.   Adrenal glands: Bilateral adrenal thickening compatible with hyperplasia.  Kidneys: Mild ureteral ectasia and minimal dilation of the collecting systems. No calyceal dilation. No renal calculi. Bilateral ureteral ectasia.  Stomach:  Grossly normal.  Small bowel:  Grossly normal.  Colon: Normal appendix. Colonic diverticulosis without diverticulitis.  Pelvic Genitourinary: Foley catheter in the urinary bladder. Large amount of clot is present in the urinary bladder with heterogeneous high attenuation on this noncontrast CT. Iatrogenic air is also present in the urinary bladder. There is mural thickening for the degree of distension which is probably secondary to chronic bladder outflow obstruction.  Prostatomegaly is present. Prostate calcifications demarcate the bladder base from the enlarged prostate gland which produces an impression on the inferior bladder. Prostate brachytherapy seeds are present.  Peritoneum: No intra-abdominal free air.  Vascular/lymphatic: Atherosclerosis. No gross acute vascular abnormality allowing for noncontrast technique.  Body Wall: Patient is cachectic.  IMPRESSION: 1. Large amount of clot in the urinary bladder producing distension. 2. Foley catheter position in the urinary bladder with iatrogenic air. 3. Prostatomegaly with prostate brachytherapy seeds. 4. Very mild hydroureteronephrosis, probably secondary to clot in the bladder.   Electronically Signed   By: Dereck Ligas M.D.   On: 11/28/2014 14:55    Scheduled Meds: . atorvastatin  20 mg Oral q1800  . cefTRIAXone (ROCEPHIN)  IV  1 g Intravenous Q24H  . donepezil  10 mg Oral QHS  . feeding supplement (ENSURE ENLIVE)  237 mL Oral TID BM  . insulin aspart  0-15 Units Subcutaneous TID WC  . insulin aspart  0-5 Units Subcutaneous QHS  . linagliptin  5 mg Oral Daily  . memantine  28 mg Oral Daily  . metoprolol tartrate  25 mg Oral TID  . mupirocin cream   Topical Daily  . oxyCODONE  5 mg Oral Once  . sodium chloride  3  mL Intravenous Q12H  . tamsulosin  0.8 mg Oral QHS  . traZODone  50 mg Oral QHS   Continuous Infusions: . sodium chloride      Time spent: > 35 minutes   Velvet Bathe  [facility] Pager [phone] If 7PM-7AM, please contact night-coverage at [email or web address], password [facility] 11/29/2014, 3:33 PM  LOS: 1 day

## 2014-11-29 NOTE — Evaluation (Signed)
Physical Therapy Evaluation Patient Details Name: Craig Henderson MRN: [id number] DOB: Sep 26, 1929 Today's Date: 11/29/2014   History of Present Illness  HPI: Craig Henderson is a 81 y.o. male is a pleasant 81 year old gentleman with a past medical history of prostate cancer treated with radiation therapy 2013, history of advanced dementia who requires assistance with most activities of daily living at baseline, history of atrial flutter on anticoagulation with Xarelto, status post cardioversion on 05/11/2013, who was brought to the emergency department by family members for hematuria.  Clinical Impression   Pt admitted with above diagnosis. Pt currently with functional limitations due to the deficits listed below (see PT Problem List).  Pt will benefit from skilled PT to increase their independence and safety with mobility to allow discharge to the venue listed below.       Follow Up Recommendations SNF;Supervision/Assistance - 24 hour  If family is able to provide for 24 hour max assist, and they have needed equipment, then consider dc home    Equipment Recommendations  Other (comment) (to be determined)    Recommendations for Other Services       Precautions / Restrictions Precautions Precautions: Fall      Mobility  Bed Mobility Overal bed mobility: Needs Assistance Bed Mobility: Supine to Sit;Sit to Supine     Supine to sit: Total assist Sit to supine: Total assist   General bed mobility comments: Total assist for all mobility  Transfers Overall transfer level: Needs assistance Equipment used:  (Bed pad) Transfers: Lateral/Scoot Transfers          Lateral/Scoot Transfers: Total assist General transfer comment: Total assist and use of bed pad to scoot hips up toward [facility] for better positioning to lay down  Ambulation/Gait                Stairs            Wheelchair Mobility    Modified Rankin (Stroke Patients Only)       Balance Overall  balance assessment: Needs assistance     Sitting balance - Comments: Sat EOB approx 5 minutes with min to minguard assist; Dependent on UEs to prop for balance; Noted able to lift head to look out the window eyes open while sitting up; He made a small smile when I asked him if I could call him "Oz"                                     Pertinent Vitals/Pain Pain Assessment: No/denies pain (no signs of pain)    Home Living Family/patient expects to be discharged to:: Private residence Living Arrangements: Spouse/significant other Available Help at Discharge: Other (Comment) (to be determined) Type of Home: House Home Access: Stairs to enter Entrance Stairs-Rails: Left Entrance Stairs-Number of Steps: 3 Home Layout: One level   Additional Comments: No family present to give reliable information re: home setup and available assist    Prior Function Level of Independence: Needs assistance         Comments: Per chart review, Needs assist with most if not all ADLs; no family available to confirm     Hand Dominance        Extremity/Trunk Assessment   Upper Extremity Assessment: Generalized weakness (bradykinetic)           Lower Extremity Assessment: Generalized weakness (Bradykinetic; Noted some resistence to passive extension)  Communication   Communication: Expressive difficulties;Other (comment) (no verbalizations at all during session)  Cognition Arousal/Alertness: Lethargic (eyes open approx 50% of time) Behavior During Therapy: Flat affect Overall Cognitive Status: No family/caregiver present to determine baseline cognitive functioning                      General Comments General comments (skin integrity, edema, etc.): Will need more info re; home assist    Exercises        Assessment/Plan    PT Assessment Patient needs continued PT services  PT Diagnosis Generalized weakness   PT Problem List Decreased  strength;Decreased range of motion;Decreased activity tolerance;Decreased balance;Decreased mobility;Decreased coordination;Decreased cognition;Decreased knowledge of use of DME;Decreased safety awareness;Decreased knowledge of precautions  PT Treatment Interventions DME instruction;Gait training;Functional mobility training;Therapeutic activities;Therapeutic exercise;Balance training;Cognitive remediation;Patient/family education   PT Goals (Current goals can be found in the Care Plan section) Acute Rehab PT Goals Patient Stated Goal: unable to state PT Goal Formulation: Patient unable to participate in goal setting (Goals will be updated prn as more info re: home/PLOF is available) Time For Goal Achievement: 12/13/14 Potential to Achieve Goals: Fair    Frequency Min 2X/week   Barriers to discharge   Needmore information re: available home assist, equipment, and setup    Co-evaluation               End of Session Equipment Utilized During Treatment:  (Bed pad) Activity Tolerance: Patient tolerated treatment well Patient left: in bed;with call bell/phone within reach;with bed alarm set (L eft semi-sidelying) Nurse Communication: Mobility status;Need for lift equipment         Time: 1206 (Times are approx)-1216 PT Time Calculation (min) (ACUTE ONLY): 10 min   Charges:   PT Evaluation $Initial PT Evaluation Tier I: 1 Procedure     PT G CodesRoney Marion Hamff 11/29/2014, 2:18 PM  Roney Marion, [place] Pager [phone] Office [phone]

## 2014-11-29 NOTE — Progress Notes (Signed)
Hgb this morning is 7.4 which dropped from 11.2 yesterday. Will notify L. Harduk PA

## 2014-11-29 NOTE — Consult Note (Addendum)
WOC wound consult note Reason for Consult: Consult requested for left great toe wound. Toenail is yellow, loose and crumbling and on the verge of falling off.  Nail appearance is consistent with probable fungal infection. Wound type: Full thickness wound located directly underneath the loose toenail, where it has been pressing against the skin. Measurement: .4X1.3X.3cm Wound bed: Dark red, dry wound bed Drainage (amount, consistency, odor) No odor or drainage Periwound:  Loose peeling calloused skin surrounding the wound removes easily. Dressing procedure/placement/frequency: Bactroban to promote moist healing.  Suspect the toenail will fall off soon. Please re-consult if further assistance is needed.  Thank-you,  Julien Girt MSN, [place], [place], [place], [place]

## 2014-11-29 NOTE — Progress Notes (Signed)
Irrigated q2hrs with 70cc sterile water as ordered. Clots have become less in number and improving. Urine is dark blood in color.

## 2014-11-29 NOTE — Progress Notes (Signed)
Initial Nutrition Assessment  DOCUMENTATION CODES:   Severe malnutrition in context of chronic illness  INTERVENTION:   Continue Ensure Enlive po TID, each supplement provides 350 kcal and 20 grams of protein  Encourage adequate PO intake.   NUTRITION DIAGNOSIS:   Malnutrition related to chronic illness as evidenced by percent weight loss, severe depletion of body fat, severe depletion of muscle mass.  GOAL:   Patient will meet greater than or equal to 90% of their needs  MONITOR:   PO intake, Supplement acceptance, Weight trends, Labs, I & O's  REASON FOR ASSESSMENT:   Malnutrition Screening Tool    ASSESSMENT:   81 year old gentleman with a past medical history of prostate cancer treated with radiation therapy 2013, history of advanced dementia who requires assistance with most activities of daily living at baseline, history of atrial flutter on anticoagulation with Xarelto, status post cardioversion on 05/11/2013, who was brought to the emergency department by family members for hematuria.  Pt with poor appetite. Per RN, po intake has been poor including Ensure intake. No family at bedside. Meal completion has been 0-25%. Pt with weight loss, per Epic weight records, pt with a 15.7% weight loss in 2 months. Pt currently has Ensure TID ordered. RD to continue with current orders. Pt reports he does favor the Ensure. Pt was encouraged to eat his food at meals and to drink his supplements.   Nutrition-Focused physical exam completed. Findings are severe fat depletion, severe muscle depletion, and no edema.   Labs and medications reviewed.   Diet Order:  Diet Heart Room service appropriate?: Yes; Fluid consistency:: Thin  Skin:  Reviewed, no issues  Last BM:  PTA  Height:   Ht Readings from Last 1 Encounters:  09/14/14 5\' 2"  (1.575 m)    Weight:   Wt Readings from Last 1 Encounters:  11/28/14 128 lb 12.8 oz (58.423 kg)    Ideal Body Weight:  53.6 kg  BMI:   Body mass index is 23.55 kg/(m^2).  Estimated Nutritional Needs:   Kcal:  1700-1900  Protein:  70-85 grams  Fluid:  1.7 - 1.9 L/day  EDUCATION NEEDS:   No education needs identified at this time  Corrin Parker, MS, RD, LDN Pager # [phone] After hours/ weekend pager # [phone]

## 2014-11-30 DIAGNOSIS — E43 Unspecified severe protein-calorie malnutrition: Secondary | ICD-10-CM | POA: Insufficient documentation

## 2014-11-30 LAB — TYPE AND SCREEN
ABO/RH(D): O NEG
ANTIBODY SCREEN: NEGATIVE
UNIT DIVISION: 0
Unit division: 0

## 2014-11-30 LAB — GLUCOSE, CAPILLARY
GLUCOSE-CAPILLARY: 183 mg/dL — AB (ref 65–99)
GLUCOSE-CAPILLARY: 184 mg/dL — AB (ref 65–99)
GLUCOSE-CAPILLARY: 198 mg/dL — AB (ref 65–99)
GLUCOSE-CAPILLARY: 86 mg/dL (ref 65–99)

## 2014-11-30 NOTE — Clinical Social Work Placement (Signed)
   CLINICAL SOCIAL WORK PLACEMENT  NOTE  Date:  11/30/2014  Patient Details  Name: Craig Henderson MRN: [id number] Date of Birth: [birth year]  Clinical Social Work is seeking post-discharge placement for this patient at the [place] level of care (*CSW will initial, date and re-position this form in  chart as items are completed):  No   Patient/family provided with [place] Work Department's list of facilities offering this level of care within the geographic area requested by the patient (or if unable, by the patient's family).  Yes   Patient/family informed of their freedom to choose among providers that offer the needed level of care, that participate in Medicare, Medicaid or managed care program needed by the patient, have an available bed and are willing to accept the patient.  No   Patient/family informed of [facility]'s ownership interest in [facility] and [facility], as well as of the fact that they are under no obligation to receive care at these facilities.  PASRR submitted to EDS on       PASRR number received on       Existing PASRR number confirmed on 11/30/14     FL2 transmitted to all facilities in geographic area requested by pt/family on 11/30/14     FL2 transmitted to all facilities within larger geographic area on       Patient informed that his/her managed care company has contracts with or will negotiate with certain facilities, including the following:            Patient/family informed of bed offers received.  Patient chooses bed at       Physician recommends and patient chooses bed at      Patient to be transferred to   on  .  Patient to be transferred to facility by       Patient family notified on   of transfer.  Name of family member notified:        PHYSICIAN       Additional Comment:  11/30/14 - Facility preference is [facility].  _______________________________________________ Sable Feil, LCSW 11/30/2014, 12:54 PM

## 2014-11-30 NOTE — Progress Notes (Signed)
[facility] PROGRESS NOTE  Craig Henderson DOB: 03/29/1930 DOA: 11/28/2014 PCP: Elyn Peers, MD  Brief Narrative: Patient is an 81 year old with past medical history of prostate cancer treated with radiation therapy in 2013, history of advanced dementia, history of atrial flutter on anticoagulation with xarelto status post cardioversion 05/11/2013. He presented to the emergency department secondary to hematuria. Urology subsequently consulted and etiology currently felt to be secondary to hemorrhagic cystitis. Patient started on antibiotics and patient's anticoagulation medication was held.  Assessment/Plan: Principal Problem:   Hemorrhagic cystitis - Urology on board -Continue IV antibiotics -Continue monitor hemoglobin levels -Transfused 2 units of packed red blood cells  Active Problems:   Diabetes mellitus type 2, uncontrolled - Continue current diabetic regimen  History of atrial fibrillation - Currently on metoprolol Anticoagulation held secondary to life-threatening bleeding    Dementia - Stable continue Namenda and Aricept    Moderate malnutrition - Pt on Ensure    BPH (benign prostatic hyperplasia) - stable continue flomax  Code Status: full Family Communication: Discussed directly with patient Disposition Plan: Pending improvement in condition and further recommendations from urologist.   Consultants:  Urology   Antibiotics:  Ceftriaxone  HPI/Subjective:  No acute issues reported overnight.  Objective: Filed Vitals:   11/30/14 0830  BP: 117/61  Pulse: 72  Temp: 98.6 F (37 C)  Resp: 16    Intake/Output Summary (Last 24 hours) at 11/30/14 1344 Last data filed at 11/30/14 1100  Gross per 24 hour  Intake   3605 ml  Output   1840 ml  Net   1765 ml   Filed Weights   11/28/14 2100  Weight: 58.423 kg (128 lb 12.8 oz)    Exam:   General:  Pt in nad, alert and awake  Cardiovascular: rrr, no mrg  Respiratory: cta  bl, no wheezes  Abdomen: soft, no guarding  Musculoskeletal: equal tone BL   Data Reviewed: Basic Metabolic Panel:  Recent Labs Lab 11/28/14 1149 11/28/14 1202 11/29/14 0440  NA 135 137 140  K 4.5 4.1 4.8  CL 104 105 110  CO2 22  --  22  GLUCOSE 169* 166* 157*  BUN 20 24* 27*  CREATININE 1.27* 1.20 1.68*  CALCIUM 8.9  --  8.4*   Liver Function Tests: No results for input(s): AST, ALT, ALKPHOS, BILITOT, PROT, ALBUMIN in the last 168 hours. No results for input(s): LIPASE, AMYLASE in the last 168 hours. No results for input(s): AMMONIA in the last 168 hours. CBC:  Recent Labs Lab 11/28/14 1149 11/28/14 1202 11/29/14 0440 11/29/14 2159  WBC 12.5*  --  20.9*  --   NEUTROABS 10.9*  --   --   --   HGB 11.1* 11.2* 7.4* 9.4*  HCT 32.2* 33.0* 21.3* 26.9*  MCV 92.0  --  93.0  --   PLT 227  --  194  --    Cardiac Enzymes:  Recent Labs Lab 11/28/14 1149  TROPONINI <0.03   BNP (last 3 results) No results for input(s): BNP in the last 8760 hours.  ProBNP (last 3 results) No results for input(s): PROBNP in the last 8760 hours.  CBG:  Recent Labs Lab 11/29/14 0751 11/29/14 1112 11/29/14 1646 11/30/14 0829 11/30/14 1200  GLUCAP 132* 121* 148* 183* 184*    Recent Results (from the past 240 hour(s))  Urine culture     Status: None   Collection Time: 11/28/14 11:30 AM  Result Value Ref Range Status   Specimen Description URINE, CLEAN  CATCH  Final   Special Requests NONE  Final   Culture 3,000 COLONIES/mL INSIGNIFICANT GROWTH  Final   Report Status 11/29/2014 FINAL  Final     Studies: No results found.  Scheduled Meds: . atorvastatin  20 mg Oral q1800  . cefTRIAXone (ROCEPHIN)  IV  1 g Intravenous Q24H  . donepezil  10 mg Oral QHS  . feeding supplement (ENSURE ENLIVE)  237 mL Oral TID BM  . insulin aspart  0-15 Units Subcutaneous TID WC  . insulin aspart  0-5 Units Subcutaneous QHS  . linagliptin  5 mg Oral Daily  . memantine  28 mg Oral Daily  .  metoprolol tartrate  25 mg Oral TID  . mupirocin cream   Topical Daily  . oxyCODONE  5 mg Oral Once  . sodium chloride  3 mL Intravenous Q12H  . tamsulosin  0.8 mg Oral QHS  . traZODone  50 mg Oral QHS   Continuous Infusions: . sodium chloride      Time spent: > 35 minutes   Velvet Bathe  [facility] Pager [phone] If 7PM-7AM, please contact night-coverage at [email or web address], password [facility] 11/30/2014, 1:44 PM  LOS: 2 days

## 2014-11-30 NOTE — Progress Notes (Signed)
Utilization review completed. Adream Parzych, RN, BSN. 

## 2014-11-30 NOTE — Clinical Social Work Note (Signed)
Clinical Social Work Assessment  Patient Details  Name: Craig Henderson MRN: [id number] Date of Birth: [birth year]  Date of referral:  11/30/14               Reason for consult:  Facility Placement                Permission sought to share information with:  Other (CSW spoke with wife, Florine by phone as patient has advanced dementia) Permission granted to share information::  Yes, Verbal Permission Granted (Permission granted by wife to communicate with SNF's)  Name::        Agency::  [facility], which is wife's preference.  Relationship::     Contact Information:     Housing/Transportation Living arrangements for the past 2 months:  Single Family Home Source of Information:  Spouse Patient Interpreter Needed:  None Criminal Activity/Legal Involvement Pertinent to Current Situation/Hospitalization:  No - Comment as needed Significant Relationships:  Adult Children, Spouse Lives with:  Spouse Do you feel safe going back to the place where you live?  Yes Need for family participation in patient care:  Yes (Comment)  Care giving concerns:  Wife appears to understand that she cannot care for patient at home post-discharge.   Social Worker assessment / plan:  CSW spoke with Mrs. Sitar (Florine) by phone regarding d/c planning and recommendation of short-term rehab and she is in agreement. Mrs. Peddie reported that patient was last at [facility], however she now wants him to go to [facility] as she had problems with some of his clothing going missing, although she had a sign up that she would be doing the laundry on his clothing.  When asked, Mrs. Houp did not indicate a preference for a private room.  Employment status:  Retired Nurse, adult PT Recommendations:  [place] / Referral to community resources:  Other (Comment Required) (Not needed or requested at this  time)  Patient/Family's Response to care:  Mrs. Hargadon did not report any concerns regarding patient's current level of care.  Patient/Family's Understanding of and Emotional Response to Diagnosis, Current Treatment, and Prognosis:  Not discussed.  Emotional Assessment Appearance:  Appears stated age (CSW looked in on patient to determine if family present, howver no family in room and patient was asleep.) Attitude/Demeanor/Rapport:  Unable to Assess Affect (typically observed):  Unable to Assess Orientation:  Oriented to Self Alcohol / Substance use:  Alcohol Use (Patient reports that he quit smoking 42 years ago. Patient drinks alcohol but does not use illicit drugs.) Psych involvement (Current and /or in the community):  No (Comment)  Discharge Needs  Concerns to be addressed:  Discharge Planning Concerns Readmission within the last 30 days:  No Current discharge risk:  None Barriers to Discharge:  No Barriers Identified   Sable Feil, LCSW 11/30/2014, 12:48 PM

## 2014-12-01 DIAGNOSIS — N3091 Cystitis, unspecified with hematuria: Secondary | ICD-10-CM

## 2014-12-01 DIAGNOSIS — F0390 Unspecified dementia without behavioral disturbance: Secondary | ICD-10-CM

## 2014-12-01 DIAGNOSIS — E440 Moderate protein-calorie malnutrition: Secondary | ICD-10-CM

## 2014-12-01 DIAGNOSIS — D62 Acute posthemorrhagic anemia: Secondary | ICD-10-CM

## 2014-12-01 DIAGNOSIS — E1165 Type 2 diabetes mellitus with hyperglycemia: Secondary | ICD-10-CM

## 2014-12-01 LAB — CBC
HCT: 25.2 % — ABNORMAL LOW (ref 39.0–52.0)
Hemoglobin: 8.7 g/dL — ABNORMAL LOW (ref 13.0–17.0)
MCH: 31.4 pg (ref 26.0–34.0)
MCHC: 34.5 g/dL (ref 30.0–36.0)
MCV: 91 fL (ref 78.0–100.0)
PLATELETS: 159 10*3/uL (ref 150–400)
RBC: 2.77 MIL/uL — ABNORMAL LOW (ref 4.22–5.81)
RDW: 15.1 % (ref 11.5–15.5)
WBC: 16.1 10*3/uL — AB (ref 4.0–10.5)

## 2014-12-01 LAB — GLUCOSE, CAPILLARY
GLUCOSE-CAPILLARY: 173 mg/dL — AB (ref 65–99)
Glucose-Capillary: 183 mg/dL — ABNORMAL HIGH (ref 65–99)
Glucose-Capillary: 206 mg/dL — ABNORMAL HIGH (ref 65–99)
Glucose-Capillary: 179 mg/dL — ABNORMAL HIGH (ref 65–99)

## 2014-12-01 LAB — BASIC METABOLIC PANEL
Anion gap: 9 (ref 5–15)
BUN: 17 mg/dL (ref 6–20)
CALCIUM: 7.9 mg/dL — AB (ref 8.9–10.3)
CO2: 21 mmol/L — ABNORMAL LOW (ref 22–32)
CREATININE: 1.24 mg/dL (ref 0.61–1.24)
Chloride: 110 mmol/L (ref 101–111)
GFR calc Af Amer: >60 mL/min (ref >60)
GFR, EST NON AFRICAN AMERICAN: 52 mL/min — AB (ref >60)
GLUCOSE: 167 mg/dL — AB (ref 65–99)
POTASSIUM: 3.7 mmol/L (ref 3.5–5.1)
SODIUM: 140 mmol/L (ref 135–145)

## 2014-12-01 MED ORDER — ACETAMINOPHEN 325 MG PO TABS: 650.0000 mg | ORAL_TABLET | Freq: Four times a day (QID) | ORAL | Status: AC | PRN

## 2014-12-01 MED ORDER — OXYCODONE HCL 5 MG PO TABS: 5.0000 mg | ORAL_TABLET | Freq: Every day | ORAL | Status: AC | PRN

## 2014-12-01 MED ORDER — CEPHALEXIN 250 MG PO CAPS: 250.0000 mg | ORAL_CAPSULE | Freq: Three times a day (TID) | ORAL | Status: DC

## 2014-12-01 MED ORDER — CEPHALEXIN 250 MG PO CAPS
250.0000 mg | ORAL_CAPSULE | Freq: Three times a day (TID) | ORAL | Status: DC
  Administered 2014-12-01 – 2014-12-02 (×5): 250 mg via ORAL
  Filled 2014-12-01 (×9): qty 1

## 2014-12-01 MED ORDER — TRAZODONE HCL 50 MG PO TABS: 50.0000 mg | ORAL_TABLET | Freq: Every evening | ORAL | Status: AC | PRN

## 2014-12-01 NOTE — Progress Notes (Signed)
  Subjective: Patient reports : No pain or discomfort with Foley catheter  Objective: Vital signs in last 24 hours: Temp:  [97.9 F (36.6 C)-100.5 F (38.1 C)] 97.9 F (36.6 C) (08/24 1002) Pulse Rate:  [57-70] 70 (08/24 1002) Resp:  [14-18] 16 (08/24 1002) BP: (113-153)/(47-67) 120/49 mmHg (08/24 1002) SpO2:  [99 %-100 %] 100 % (08/24 1002)  Intake/Output from previous day: 08/23 0701 - 08/24 0700 In: 1930 [P.O.:1860] Out: 1800 [Urine:1800] Intake/Output this shift: Total I/O In: 280 [P.O.:280] Out: 650 [Urine:650]  Physical Exam:   GI: Soft, non distended, non tender.  Bladder not distended. Has indwelling Foley catheter.  Urine is pinkish today, clearing up.   CT scan without contrast showed mild hydronephrosis, no renal mass.  Lab Results:  Recent Labs  11/29/14 0440 11/29/14 2159 12/01/14 0021  HGB 7.4* 9.4* 8.7*  HCT 21.3* 26.9* 25.2*   BMET  Recent Labs  11/29/14 0440 12/01/14 0021  NA 140 140  K 4.8 3.7  CL 110 110  CO2 22 21*  GLUCOSE 157* 167*  BUN 27* 17  CREATININE 1.68* 1.24  CALCIUM 8.4* 7.9*   No results for input(s): LABPT, INR in the last 72 hours. No results for input(s): LABURIN in the last 72 hours. Results for orders placed or performed during the hospital encounter of 11/28/14  Urine culture     Status: None   Collection Time: 11/28/14 11:30 AM  Result Value Ref Range Status   Specimen Description URINE, CLEAN CATCH  Final   Special Requests NONE  Final   Culture 3,000 COLONIES/mL INSIGNIFICANT GROWTH  Final   Report Status 11/29/2014 FINAL  Final     Assessment/Plan:  Adenocarcinoma of prostate.  Gross hematuria.  Radiation cystitis. Anemia  Gross hematuria probably secondary to radiation cystitis or of prostatic origin.    Since urine is clearing up I would leave Foley indwelling.  Can be discharged urologically.  Will follow as outpatient.   LOS: 3 days   Craig Henderson 12/01/2014, 2:00 PM

## 2014-12-01 NOTE — Progress Notes (Signed)
Physical Therapy Treatment Patient Details Name: Craig Henderson MRN: [id number] DOB: 1929-06-24 Today's Date: 12/01/2014    History of Present Illness HPI: Craig Henderson is a 81 y.o. male is a pleasant 81 year old gentleman with a past medical history of prostate cancer treated with radiation therapy 2013, history of advanced dementia who requires assistance with most activities of daily living at baseline, history of atrial flutter on anticoagulation with Xarelto, status post cardioversion on 05/11/2013, who was brought to the emergency department by family members for hematuria.    PT Comments    Able to walk with Rw and 2 person assist; Very slow moving, Bradykinetic    Follow Up Recommendations  SNF;Supervision/Assistance - 24 hour     Equipment Recommendations  Rolling walker with 5" wheels;3in1 (PT)    Recommendations for Other Services       Precautions / Restrictions Precautions Precautions: Fall Restrictions Weight Bearing Restrictions: No    Mobility  Bed Mobility Overal bed mobility: Needs Assistance Bed Mobility: Supine to Sit     Supine to sit: Max assist     General bed mobility comments: Follows commands related to mobility slowly; Mod assist to come to sit  Transfers Overall transfer level: Needs assistance Equipment used: Rolling walker (2 wheeled) Transfers: Sit to/from Stand Sit to Stand: Mod assist         General transfer comment: Mod assist to power up;  very slow moving  Ambulation/Gait Ambulation/Gait assistance: Mod assist;Min assist;+2 safety/equipment Ambulation Distance (Feet): 15 Feet Assistive device: Rolling walker (2 wheeled) Gait Pattern/deviations: Decreased stride length;Trunk flexed   Gait velocity interpretation: Below normal speed for age/gender General Gait Details: Moves very slowly; able to take steps with RW to sink and then to door of room   Stairs            Wheelchair Mobility    Modified  Rankin (Stroke Patients Only)       Balance                                    Cognition Arousal/Alertness: Awake/alert Behavior During Therapy: Flat affect Overall Cognitive Status: No family/caregiver present to determine baseline cognitive functioning                      Exercises      General Comments General comments (skin integrity, edema, etc.): Seemed pleased with getting up and walking      Pertinent Vitals/Pain Pain Assessment: No/denies pain    Home Living                      Prior Function            PT Goals (current goals can now be found in the care plan section) Acute Rehab PT Goals Patient Stated Goal: Agreeable to amb PT Goal Formulation: Patient unable to participate in goal setting Time For Goal Achievement: 12/13/14 Potential to Achieve Goals: Fair Progress towards PT goals: Progressing toward goals (added gait goal)    Frequency  Min 2X/week    PT Plan Current plan remains appropriate    Co-evaluation             End of Session Equipment Utilized During Treatment: Gait belt Activity Tolerance: Patient tolerated treatment well Patient left: in chair;with call bell/phone within reach;with chair alarm set     Time: 8280-0349 PT Time Calculation (  min) (ACUTE ONLY): 26 min  Charges:  $Gait Training: 23-37 mins                    G Codes:      Roney Marion Hamff 12/01/2014, 12:11 PM  Roney Marion, [place]  Acute Rehabilitation Services Pager [phone] Office [phone]

## 2014-12-01 NOTE — Care Management Important Message (Signed)
Important Message  Patient Details  Name: Craig Henderson MRN: [id number] Date of Birth: 09/28/1929   Medicare Important Message Given:  Yes-second notification given    Delorse Lek 12/01/2014, 10:56 AM

## 2014-12-01 NOTE — Progress Notes (Addendum)
[facility] PROGRESS NOTE  Craig Henderson UXL:[id number] DOB: Apr 06, 1930 DOA: 11/28/2014  PCP: Elyn Peers, MD  Brief HPI: 81 year old African-American male with a past medical history of prostate cancer treated with radiation treatment, advanced dementia, history of atrial flutter on anticoagulation with Xarelto, status post cardioversion in February 2015, presented to the emergency department with complaints of blood in the urine. He was hospitalized for further management.  Past medical history:  Past Medical History  Diagnosis Date  . Prostate cancer 07/18/11    bx=adenocarcinoma=PSA=12.45,gleason=4+5=9,4+3=7,4+4=8, 3+3=6 & 3+7,volume=156.35cc  . Diabetes mellitus   . Hypertension   . Hypercholesterolemia   . BPH (benign prostatic hyperplasia)     with nocturia,  . ED (erectile dysfunction)   . Hx of radiation therapy 10/23/11 - 12/18/11    prostate, seminal vesicles, lymph nodes  . Mild dementia     Consultants: Urology  Procedures: None  Antibiotics: Ceftriaxone changed over to Keflex 8/24  Subjective: Patient has dementia. Unable to communicate appropriately. Denies any pain.  Objective: Vital Signs  Filed Vitals:   11/30/14 1826 11/30/14 2145 12/01/14 0615 12/01/14 1002  BP: 153/67 146/62 113/47 120/49  Pulse: 65 67 57 70  Temp: 99.6 F (37.6 C) 100.5 F (38.1 C) 97.9 F (36.6 C) 97.9 F (36.6 C)  TempSrc: Oral Oral Oral Oral  Resp: 18 14 14 16   Weight:      SpO2: 100% 99% 100% 100%    Intake/Output Summary (Last 24 hours) at 12/01/14 1255 Last data filed at 12/01/14 1206  Gross per 24 hour  Intake   1540 ml  Output   2100 ml  Net   -560 ml   Filed Weights   11/28/14 2100  Weight: 58.423 kg (128 lb 12.8 oz)    General appearance: alert, cooperative, appears stated age and no distress Resp: clear to auscultation bilaterally Cardio: regular rate and rhythm, S1, S2 normal, no murmur, click, rub or gallop GI: soft, non-tender; bowel  sounds normal; no masses,  no organomegaly Neurologic: Alert. Pleasantly confused.  Lab Results:  Basic Metabolic Panel:  Recent Labs Lab 11/28/14 1149 11/28/14 1202 11/29/14 0440 12/01/14 0021  NA 135 137 140 140  K 4.5 4.1 4.8 3.7  CL 104 105 110 110  CO2 22  --  22 21*  GLUCOSE 169* 166* 157* 167*  BUN 20 24* 27* 17  CREATININE 1.27* 1.20 1.68* 1.24  CALCIUM 8.9  --  8.4* 7.9*   CBC:  Recent Labs Lab 11/28/14 1149 11/28/14 1202 11/29/14 0440 11/29/14 2159 12/01/14 0021  WBC 12.5*  --  20.9*  --  16.1*  NEUTROABS 10.9*  --   --   --   --   HGB 11.1* 11.2* 7.4* 9.4* 8.7*  HCT 32.2* 33.0* 21.3* 26.9* 25.2*  MCV 92.0  --  93.0  --  91.0  PLT 227  --  194  --  159   Cardiac Enzymes:  Recent Labs Lab 11/28/14 1149  TROPONINI <0.03   CBG:  Recent Labs Lab 11/30/14 1200 11/30/14 1646 11/30/14 2143 12/01/14 0739 12/01/14 1125  GLUCAP 184* 198* 86 173* 183*    Recent Results (from the past 240 hour(s))  Urine culture     Status: None   Collection Time: 11/28/14 11:30 AM  Result Value Ref Range Status   Specimen Description URINE, CLEAN CATCH  Final   Special Requests NONE  Final   Culture 3,000 COLONIES/mL INSIGNIFICANT GROWTH  Final   Report Status 11/29/2014 FINAL  Final      Studies/Results: No results found.  Medications:  Scheduled: . atorvastatin  20 mg Oral q1800  . cephALEXin  250 mg Oral 3 times per day  . donepezil  10 mg Oral QHS  . feeding supplement (ENSURE ENLIVE)  237 mL Oral TID BM  . insulin aspart  0-15 Units Subcutaneous TID WC  . insulin aspart  0-5 Units Subcutaneous QHS  . linagliptin  5 mg Oral Daily  . memantine  28 mg Oral Daily  . metoprolol tartrate  25 mg Oral TID  . mupirocin cream   Topical Daily  . oxyCODONE  5 mg Oral Once  . sodium chloride  3 mL Intravenous Q12H  . tamsulosin  0.8 mg Oral QHS  . traZODone  50 mg Oral QHS   Continuous:  FGB:[id number] **OR** acetaminophen, alum & mag  hydroxide-simeth, ondansetron **OR** ondansetron (ZOFRAN) IV, oxyCODONE  Assessment/Plan:  Principal Problem:   Hemorrhagic cystitis Active Problems:   Diabetes mellitus type 2, uncontrolled   Diabetic foot ulcer   Dementia   Moderate malnutrition   BPH (benign prostatic hyperplasia)     Hemorrhagic cystitis Bleeding appears to be subsiding. Urine is still blood-tinged. Discussed with Dr. Dorina Hoyer this morning. He will reevaluate patient. Currently off of anticoagulation.  Acute blood loss anemia  Secondary to hematuria. He was transfused 2 units of blood at the time of admission. Hemoglobin more or less stable but has been drifting down. Continue to monitor.  Diabetes mellitus type 2, uncontrolled Continue current diabetic regimen including sliding scale coverage.  History of atrial fibrillation Currently on metoprolol. Heart rate is reasonably well controlled. Anticoagulation held secondary to life-threatening bleeding. We will need input from urology regarding timing of reinitiation of anticoagulation.  Dementia Stable continue Namenda and Aricept.  Moderate malnutrition Pt on Ensure  BPH (benign prostatic hyperplasia) and a history of prostate cancer Stable. Continue flomax  Chronic lower extremity wound involving the left great toe Likely vascular in etiology. Could also be due to diabetes. Local dressing changes.  DVT Prophylaxis: SCDs    Code Status: Full code  Family Communication: Discussed with the patient's wife  Disposition Plan: Await urology input. Seen by physical therapy. Will eventually go to SNF.    LOS: 3 days   [facility] Pager [phone] 12/01/2014, 12:55 PM  If 7PM-7AM, please contact night-coverage at [email or web address], password [facility]

## 2014-12-02 LAB — CBC
HEMATOCRIT: 27 % — AB (ref 39.0–52.0)
HEMOGLOBIN: 9 g/dL — AB (ref 13.0–17.0)
MCH: 30.5 pg (ref 26.0–34.0)
MCHC: 33.3 g/dL (ref 30.0–36.0)
MCV: 91.5 fL (ref 78.0–100.0)
PLATELETS: 181 10*3/uL (ref 150–400)
RBC: 2.95 MIL/uL — AB (ref 4.22–5.81)
RDW: 14.7 % (ref 11.5–15.5)
WBC: 13.3 10*3/uL — ABNORMAL HIGH (ref 4.0–10.5)

## 2014-12-02 LAB — BASIC METABOLIC PANEL
ANION GAP: 7 (ref 5–15)
BUN: 19 mg/dL (ref 6–20)
CHLORIDE: 111 mmol/L (ref 101–111)
CO2: 24 mmol/L (ref 22–32)
Calcium: 8.1 mg/dL — ABNORMAL LOW (ref 8.9–10.3)
Creatinine, Ser: 1.06 mg/dL (ref 0.61–1.24)
GFR calc Af Amer: >60 mL/min (ref >60)
GFR calc non Af Amer: >60 mL/min (ref >60)
GLUCOSE: 160 mg/dL — AB (ref 65–99)
POTASSIUM: 4 mmol/L (ref 3.5–5.1)
Sodium: 142 mmol/L (ref 135–145)

## 2014-12-02 LAB — GLUCOSE, CAPILLARY
GLUCOSE-CAPILLARY: 214 mg/dL — AB (ref 65–99)
Glucose-Capillary: 152 mg/dL — ABNORMAL HIGH (ref 65–99)
Glucose-Capillary: 152 mg/dL — ABNORMAL HIGH (ref 65–99)

## 2014-12-02 NOTE — Discharge Summary (Addendum)
[facility]  Physician Discharge Summary   Patient ID: Craig Henderson MRN: [id number] DOB/AGE: [birth year] 81 y.o.  Admit date: 11/28/2014 Discharge date: 12/02/2014  PCP: Elyn Peers, MD  DISCHARGE DIAGNOSES:  Principal Problem:   Hemorrhagic cystitis Active Problems:   Diabetes mellitus type 2, uncontrolled   Diabetic foot ulcer   Dementia   Moderate malnutrition   BPH (benign prostatic hyperplasia)    RECOMMENDATIONS FOR OUTPATIENT FOLLOW UP:  1. Please obtain palliative medicine input at SNF.  2. Resumption of Xarelto to be determined after urological work up is completed by Dr. Janice Norrie.  3. Check CBC and Bmet weekly starting Aug 29. Check CBC earlier if noted to have more blood in urine.  4. Please have speech therapy evaluate patient at SNF for swallow function.  5. Please call Dr. Janice Norrie if bleeding gets worse. 6. Patient to be discharged with Foley catheter. Not to be taken out until seen by Dr. Janice Norrie 7. Wound care for lower extremity  DISCHARGE CONDITION: fair  Diet recommendation: Dysphagia 3   Filed Weights   11/28/14 2100 12/01/14 2020  Weight: 58.423 kg (128 lb 12.8 oz) 60.782 kg (134 lb)    INITIAL HISTORY: 81 year old African-American male with a past medical history of prostate cancer treated with radiation treatment, advanced dementia, history of atrial flutter on anticoagulation with Xarelto, status post cardioversion in February 2015, presented to the emergency department with complaints of blood in the urine. He was hospitalized for further management.  Consultants: Urology  Antibiotics: Ceftriaxone changed over to Keflex 8/24   HOSPITAL COURSE:   Hemorrhagic cystitis Patient was admitted with hematuria in the setting of anticoagulation. Initially, he was passing blood clots. Foley catheter was placed by urology. He was taken off of his anticoagulation. His bleeding subsided. Seen by urology yesterday. We will follow him in their  office. Patient will remain off of his Xarelto.  Acute blood loss anemia  This was secondary to hematuria. He was transfused 2 units of blood at the time of admission. Hemoglobin has remained stable since then. Continue to monitor at the SNF.   Diabetes mellitus type 2, uncontrolled He may resume his home medication regimen.  History of atrial fibrillation Currently on metoprolol. Heart rate is reasonably well controlled. Anticoagulation held secondary to life-threatening bleeding. Resumption of anticoagulation to be determined by urology.   Dementia Stable. Continue Namenda and Aricept.  Moderate malnutrition Ensure  BPH (benign prostatic hyperplasia) and a history of prostate cancer Stable. Continue flomax  Chronic lower extremity wound involving the left great toe Likely vascular in etiology. Could also be due to diabetes. Local dressing changes. Bactroban to promote healing. Patient was seen by the wound care nurse.  Considering his comorbidities including moderate to advanced dementia, malnutrition and other medical problems, patient will benefit from a palliative medicine consult at the SNF. He may also benefit from swallow evaluation. Continue dysphagia 3 diet as recommended for now. Discussed with his wife yesterday. Cleared by urology. Okay for discharge today.   PERTINENT LABS:  The results of significant diagnostics from this hospitalization (including imaging, microbiology, ancillary and laboratory) are listed below for reference.    Microbiology: Recent Results (from the past 240 hour(s))  Urine culture     Status: None   Collection Time: 11/28/14 11:30 AM  Result Value Ref Range Status   Specimen Description URINE, CLEAN CATCH  Final   Special Requests NONE  Final   Culture 3,000 COLONIES/mL INSIGNIFICANT GROWTH  Final   Report  Status 11/29/2014 FINAL  Final     Labs: Basic Metabolic Panel:  Recent Labs Lab 11/28/14 1149 11/28/14 1202 11/29/14 0440  12/01/14 0021 12/02/14 0820  NA 135 137 140 140 142  K 4.5 4.1 4.8 3.7 4.0  CL 104 105 110 110 111  CO2 22  --  22 21* 24  GLUCOSE 169* 166* 157* 167* 160*  BUN 20 24* 27* 17 19  CREATININE 1.27* 1.20 1.68* 1.24 1.06  CALCIUM 8.9  --  8.4* 7.9* 8.1*   CBC:  Recent Labs Lab 11/28/14 1149 11/28/14 1202 11/29/14 0440 11/29/14 2159 12/01/14 0021 12/02/14 0820  WBC 12.5*  --  20.9*  --  16.1* 13.3*  NEUTROABS 10.9*  --   --   --   --   --   HGB 11.1* 11.2* 7.4* 9.4* 8.7* 9.0*  HCT 32.2* 33.0* 21.3* 26.9* 25.2* 27.0*  MCV 92.0  --  93.0  --  91.0 91.5  PLT 227  --  194  --  159 181   Cardiac Enzymes:  Recent Labs Lab 11/28/14 1149  TROPONINI <0.03   CBG:  Recent Labs Lab 12/01/14 1125 12/01/14 1710 12/01/14 2015 12/02/14 0800 12/02/14 1201  GLUCAP 183* 206* 179* 152* 152*     IMAGING STUDIES Ct Renal Stone Study  11/28/2014   CLINICAL DATA:  Hematuria.  Initial encounter.  Prostate cancer.  EXAM: CT ABDOMEN AND PELVIS WITHOUT CONTRAST  TECHNIQUE: Multidetector CT imaging of the abdomen and pelvis was performed following the standard protocol without IV contrast.  COMPARISON:  CT 10/02/2013.  FINDINGS: Unenhanced CT was performed per clinician order. Lack of IV contrast limits sensitivity and specificity, especially for evaluation of abdominal/pelvic solid viscera.  Musculoskeletal: No aggressive osseous lesions. Grade I anterolisthesis of L4 on L5. Lower lumbar facet arthrosis. No sclerotic lesions to suggest bony metastatic disease in this patient with history of prostate cancer. Posttraumatic changes with removed hardware from the proximal LEFT femur.  Lung Bases: Linear scarring/ atelectasis at the lung bases. Calcified granuloma in the anterior LEFT lower lobe.  Liver:  Grossly within normal limits.  Spleen:  Normal.  Gallbladder:  Contracted.  No calcified stones.  Common bile duct:  Normal.  Pancreas:  Normal.  Adrenal glands: Bilateral adrenal thickening compatible  with hyperplasia.  Kidneys: Mild ureteral ectasia and minimal dilation of the collecting systems. No calyceal dilation. No renal calculi. Bilateral ureteral ectasia.  Stomach:  Grossly normal.  Small bowel:  Grossly normal.  Colon: Normal appendix. Colonic diverticulosis without diverticulitis.  Pelvic Genitourinary: Foley catheter in the urinary bladder. Large amount of clot is present in the urinary bladder with heterogeneous high attenuation on this noncontrast CT. Iatrogenic air is also present in the urinary bladder. There is mural thickening for the degree of distension which is probably secondary to chronic bladder outflow obstruction.  Prostatomegaly is present. Prostate calcifications demarcate the bladder base from the enlarged prostate gland which produces an impression on the inferior bladder. Prostate brachytherapy seeds are present.  Peritoneum: No intra-abdominal free air.  Vascular/lymphatic: Atherosclerosis. No gross acute vascular abnormality allowing for noncontrast technique.  Body Wall: Patient is cachectic.  IMPRESSION: 1. Large amount of clot in the urinary bladder producing distension. 2. Foley catheter position in the urinary bladder with iatrogenic air. 3. Prostatomegaly with prostate brachytherapy seeds. 4. Very mild hydroureteronephrosis, probably secondary to clot in the bladder.   Electronically Signed   By: Dereck Ligas M.D.   On: 11/28/2014 14:55  DISCHARGE EXAMINATION: Filed Vitals:   12/01/14 2020 12/02/14 0149 12/02/14 0539 12/02/14 0820  BP: 139/55  112/56 129/54  Pulse: 68  56 56  Temp: 100.8 F (38.2 C) 98 F (36.7 C) 97.5 F (36.4 C) 97.9 F (36.6 C)  TempSrc:    Oral  Resp: 16  15 16   Weight: 60.782 kg (134 lb)     SpO2: 96%  93% 100%   General appearance: alert, distracted and no distress Resp: clear to auscultation bilaterally Cardio: regular rate and rhythm, S1, S2 normal, no murmur, click, rub or gallop GI: soft, non-tender; bowel sounds normal;  no masses,  no organomegaly   DISPOSITION: SNF  Discharge Instructions    Call MD for:  extreme fatigue    Complete by:  As directed      Call MD for:  persistant dizziness or light-headedness    Complete by:  As directed      Call MD for:  persistant nausea and vomiting    Complete by:  As directed      Call MD for:  redness, tenderness, or signs of infection (pain, swelling, redness, odor or green/yellow discharge around incision site)    Complete by:  As directed      Call MD for:  severe uncontrolled pain    Complete by:  As directed      Call MD for:  temperature >100.4    Complete by:  As directed      Discharge diet:    Complete by:  As directed   Dysphagia 3 Diet     Discharge instructions    Complete by:  As directed   Please obtain palliative medicine input at SNF. Resumption of Xarelto to be determined after urological work up is completed by Dr. Janice Norrie. Check CBC and Bmet weekly starting Aug 29. Check CBC earlier if noted to have more blood in urine. Please have speech therapy evaluate patient at SNF for swallow function. Please call Dr. Janice Norrie if bleeding gets worse.    You were cared for by a hospitalist during your hospital stay. If you have any questions about your discharge medications or the care you received while you were in the hospital after you are discharged, you can call the unit and asked to speak with the hospitalist on call if the hospitalist that took care of you is not available. Once you are discharged, your primary care physician will handle any further medical issues. Please note that NO REFILLS for any discharge medications will be authorized once you are discharged, as it is imperative that you return to your primary care physician (or establish a relationship with a primary care physician if you do not have one) for your aftercare needs so that they can reassess your need for medications and monitor your lab values. If you do not have a primary care  physician, you can call [phone] for a physician referral.     Increase activity slowly    Complete by:  As directed            ALLERGIES: No Known Allergies   Current Discharge Medication List    START taking these medications   Details  acetaminophen (TYLENOL) 325 MG tablet Take 2 tablets (650 mg total) by mouth every 6 (six) hours as needed for mild pain (or Fever >/= 101).    cephALEXin (KEFLEX) 250 MG capsule Take 1 capsule (250 mg total) by mouth every 8 (eight) hours. For 7 more days  CONTINUE these medications which have CHANGED   Details  oxyCODONE (OXY IR/ROXICODONE) 5 MG immediate release tablet Take 1 tablet (5 mg total) by mouth daily as needed (pain). Qty: 30 tablet, Refills: 0    traZODone (DESYREL) 50 MG tablet Take 1 tablet (50 mg total) by mouth at bedtime as needed for sleep. Qty: 20 tablet, Refills: 0      CONTINUE these medications which have NOT CHANGED   Details  atorvastatin (LIPITOR) 20 MG tablet Take 20 mg by mouth daily.    clotrimazole-betamethasone (LOTRISONE) cream Apply 1 application topically daily as needed (wound care).  Refills: 2    donepezil (ARICEPT) 10 MG tablet TAKE 1 TABLET BY MOUTH EVERY DAY AT BEDTIME Qty: 30 tablet, Refills: 0    ENSURE (ENSURE) Take 237 mLs by mouth 3 (three) times daily.     Iron-FA-B Cmp-C-Biot-Probiotic (FUSION PLUS) CAPS Take 1 capsule by mouth daily.    Maltodextrin-Xanthan Gum (RESOURCE THICKENUP CLEAR) POWD Use as directed for nectar thick liquids during meals Qty: 1 Can, Refills: 1    metoprolol tartrate (LOPRESSOR) 25 MG tablet Take 1 tablet (25 mg total) by mouth 3 (three) times daily. Qty: 90 tablet, Refills: 1    sitaGLIPtin (JANUVIA) 100 MG tablet Take 100 mg by mouth daily.    tamsulosin (FLOMAX) 0.4 MG CAPS capsule Take 0.4 mg by mouth at bedtime.  Refills: 0    Travoprost, BAK Free, (TRAVATAN) 0.004 % SOLN ophthalmic solution Place 1 drop into both eyes at bedtime.    NAMENDA XR  28 MG CP24 24 hr capsule TAKE ONE CAPSULE BY MOUTH EVERY DAY--NEEDS APPT Qty: 30 capsule, Refills: 0    silver sulfADIAZINE (SILVADENE) 1 % cream Apply 1 application topically daily. Qty: 50 g, Refills: 0      STOP taking these medications     rivaroxaban (XARELTO) 20 MG TABS tablet        Follow-up Information    Follow up with Elyn Peers, MD. Schedule an appointment as soon as possible for a visit in 2 weeks.   Specialty:  Family Medicine   Contact information:   [address] [place] [address] [phone]       Follow up with Arvil Persons, MD.   Specialty:  Urology   Why:  his office will schedule follow up.   Contact information:   [place] [address] [phone]       TOTAL DISCHARGE TIME: 35 minutes  [facility] Pager [phone]  12/02/2014, 12:07 PM

## 2014-12-02 NOTE — Discharge Instructions (Signed)

## 2014-12-02 NOTE — Progress Notes (Signed)
Patient Discharge:  Disposition: Pt discharged to [facility]  Education: Report given to charge nurse at [facility].   IV: Removed  Telemetry: Removed. CCMD notified.   Transportation: Transported via ambulance.  Belongings: All belongings taken with pt.

## 2014-12-02 NOTE — Progress Notes (Signed)
Report given to charge nurse at [facility].  Carole Civil, RN

## 2014-12-02 NOTE — Clinical Social Work Placement (Signed)
   CLINICAL SOCIAL WORK PLACEMENT  NOTE  Date:  12/02/2014  Patient Details  Name: Craig Henderson MRN: [id number] Date of Birth: 1929/05/28  Clinical Social Work is seeking post-discharge placement for this patient at the [place] level of care (*CSW will initial, date and re-position this form in  chart as items are completed):  No   Patient/family provided with [facility] Work Department's list of facilities offering this level of care within the geographic area requested by the patient (or if unable, by the patient's family).  Yes   Patient/family informed of their freedom to choose among providers that offer the needed level of care, that participate in Medicare, Medicaid or managed care program needed by the patient, have an available bed and are willing to accept the patient.  No   Patient/family informed of [facility]'s ownership interest in [facility] and [facility], as well as of the fact that they are under no obligation to receive care at these facilities.  PASRR submitted to EDS on       PASRR number received on       Existing PASRR number confirmed on 11/30/14     FL2 transmitted to all facilities in geographic area requested by pt/family on 11/30/14     FL2 transmitted to all facilities within larger geographic area on       Patient informed that his/her managed care company has contracts with or will negotiate with certain facilities, including the following:         12/01/14  - Patient/family informed of bed offers received.  Patient chooses bed at  [facility]     Physician recommends and patient chooses bed at      Patient to be transferred to  [facility] on  12/02/14.  Patient to be transferred to facility by  ambulance     Patient family notified on  12/02/14 of transfer.  Name of family member notified:   Wife, Florine     PHYSICIAN       Additional Comment:     _______________________________________________ Sable Feil, LCSW 12/02/2014, 3:24 PM

## 2014-12-04 LAB — BASIC METABOLIC PANEL
BUN: 11 mg/dL (ref 4–21)
CREATININE: 0.7 mg/dL (ref 0.6–1.3)
GLUCOSE: 147 mg/dL
POTASSIUM: 3.8 mmol/L (ref 3.4–5.3)
Sodium: 141 mmol/L (ref 137–147)

## 2014-12-04 LAB — CBC AND DIFFERENTIAL
HCT: 29 % — AB (ref 41–53)
HEMOGLOBIN: 8.8 g/dL — AB (ref 13.5–17.5)
Neutrophils Absolute: 10 /uL
Platelets: 228 10*3/uL (ref 150–399)
WBC: 13.1 10*3/mL

## 2014-12-06 ENCOUNTER — Non-Acute Institutional Stay (SKILLED_NURSING_FACILITY): Payer: Medicare Other | Admitting: Internal Medicine

## 2014-12-06 DIAGNOSIS — E43 Unspecified severe protein-calorie malnutrition: Secondary | ICD-10-CM

## 2014-12-06 DIAGNOSIS — N3091 Cystitis, unspecified with hematuria: Secondary | ICD-10-CM

## 2014-12-06 DIAGNOSIS — L97909 Non-pressure chronic ulcer of unspecified part of unspecified lower leg with unspecified severity: Secondary | ICD-10-CM

## 2014-12-06 DIAGNOSIS — E11622 Type 2 diabetes mellitus with other skin ulcer: Secondary | ICD-10-CM

## 2014-12-06 DIAGNOSIS — N400 Enlarged prostate without lower urinary tract symptoms: Secondary | ICD-10-CM | POA: Diagnosis not present

## 2014-12-06 DIAGNOSIS — F0390 Unspecified dementia without behavioral disturbance: Secondary | ICD-10-CM | POA: Diagnosis not present

## 2014-12-06 DIAGNOSIS — C61 Malignant neoplasm of prostate: Secondary | ICD-10-CM | POA: Diagnosis not present

## 2014-12-07 ENCOUNTER — Encounter: Payer: Self-pay | Admitting: Internal Medicine

## 2014-12-07 NOTE — Progress Notes (Addendum)
Patient ID: Craig Henderson, male   DOB: 1929/09/16, 81 y.o.   MRN: [id number]    HISTORY AND PHYSICAL   DATE: 12/06/14  Location:  [facility]    Place of Service: SNF [id number])   Extended Emergency Contact Information Primary Emergency Contact: Schweer,Florine Address: [place], [place] [address] [place] Phone: [phone] Relation: Spouse Secondary Emergency Contact: Taulbee,Stacia Address: [address]          [place], [place] [address] [place] Phone: [phone] Work Phone: [phone] Relation: Daughter  Advanced Directive information   FULL CODE   Chief Complaint  Patient presents with  . New Admit To SNF    HPI:  81 yo male seen today as a new admission into SNF following hospital stay for hemorrhagic cystitis, DM2, diabetic foot ulcer, severe protein malnutrition, BPH with hx prostate CA s/p brachytx, chronic afib. Foley cath inserted and was told it will remain until he sees urology Dr Janice Norrie. H/H 11.1/32.2; WBC 20k --->12.5K; Cr 1.27 at d/c. Albumin 2.8. He was seen by palliative care. Per d/c summary, he will need weekly BMP and CBC w diff, ST to eval swallowing fxn, wound care to diabetic foot ulcer.  He has no c/o today. He believes today is Saturday and the year is 1933. No nursing issues. No falls. He will complete keflex on 9/2nd. He takes FeSO4  Dementia stable on namenda xr and aricept. He takes prn trazodone to help him sleep  DM controlled on januvia. CBGs 140-190s. No low BS reactions. He has a diabetic toe ulcer being tx with betamethasone cream/clotrimazole cream  BP/afib rate controlled on metoprolol. xeralto on hold due to hemorrhagic cystitis. Cholesterol stable on lipitor  BPH - on flomax.   Pain controlled on oxycodone prn  He has glaucoma and uses eye gtts to control pressure  He is a poor historian due to dementia. Hx obtained from chart. He is  receiving nutritional supplements TID   Past Medical History  Diagnosis Date  . Prostate cancer 07/18/11    bx=adenocarcinoma=PSA=12.45,gleason=4+5=9,4+3=7,4+4=8, 3+3=6 & 3+7,volume=156.35cc  . Diabetes mellitus   . Hypertension   . Hypercholesterolemia   . BPH (benign prostatic hyperplasia)     with nocturia,  . ED (erectile dysfunction)   . Hx of radiation therapy 10/23/11 - 12/18/11    prostate, seminal vesicles, lymph nodes  . Mild dementia     Past Surgical History  Procedure Laterality Date  . Prostate biopsy  07/18/2011    gleason 4+5=9  . Rotator cuff repair  2011  . Fracture surgery  35 years    right leg/rod placement  . Cardioversion N/A 05/11/2013    Procedure: CARDIOVERSION;  Surgeon: Laverda Page, MD;  Location: [facility];  Service: Cardiovascular;  Laterality: N/A;  . Peripheral vascular catheterization N/A 09/14/2014    Procedure: Lower Extremity Angiography;  Surgeon: Adrian Prows, MD;  Location: [facility];  Service: Cardiovascular;  Laterality: N/A;  . Femur fracture surgery      has metal rods both legs    Patient Care Team: Lucianne Lei, MD as PCP - General (Family Medicine)  Social History   Social History  . Marital Status: Married    Spouse Name: Florine  . Number of Children: 5  . Years of Education: College   Occupational History  . [employer]     retired  Social History Main Topics  . Smoking status: Former Smoker -- 1.00 packs/day for 10 years    Types: Cigarettes    Quit date: 09/04/1972  . Smokeless tobacco: Never Used     Comment: quit 20 years ago  . Alcohol Use: 0.0 oz/week    0 Standard drinks or equivalent per week     Comment: not anymore  . Drug Use: No  . Sexual Activity: No   Other Topics Concern  . Not on file   Social History Narrative   Patient lives at home with spouse.   Caffeine Use: 1 soda daily     reports that he quit smoking about 42 years ago. His smoking use included Cigarettes. He has a 10 pack-year  smoking history. He has never used smokeless tobacco. He reports that he drinks alcohol. He reports that he does not use illicit drugs.  Family History  Problem Relation Age of Onset  . Pneumonia Mother   . Cancer Father    Family Status  Relation Status Death Age  . Mother Deceased 9  . Father Deceased 37     There is no immunization history on file for this patient.  No Known Allergies  Medications: Patient's Medications  New Prescriptions   No medications on file  Previous Medications   ACETAMINOPHEN (TYLENOL) 325 MG TABLET    Take 2 tablets (650 mg total) by mouth every 6 (six) hours as needed for mild pain (or Fever >/= 101).   ATORVASTATIN (LIPITOR) 20 MG TABLET    Take 20 mg by mouth daily.   CEPHALEXIN (KEFLEX) 250 MG CAPSULE    Take 1 capsule (250 mg total) by mouth every 8 (eight) hours. For 7 more days   CLOTRIMAZOLE-BETAMETHASONE (LOTRISONE) CREAM    Apply 1 application topically daily as needed (wound care).    DONEPEZIL (ARICEPT) 10 MG TABLET    TAKE 1 TABLET BY MOUTH EVERY DAY AT BEDTIME   ENSURE (ENSURE)    Take 237 mLs by mouth 3 (three) times daily.    IRON-FA-B CMP-C-BIOT-PROBIOTIC (FUSION PLUS) CAPS    Take 1 capsule by mouth daily.   MALTODEXTRIN-XANTHAN GUM (RESOURCE THICKENUP CLEAR) POWD    Use as directed for nectar thick liquids during meals   METOPROLOL TARTRATE (LOPRESSOR) 25 MG TABLET    Take 1 tablet (25 mg total) by mouth 3 (three) times daily.   NAMENDA XR 28 MG CP24 24 HR CAPSULE    TAKE ONE CAPSULE BY MOUTH EVERY DAY--NEEDS APPT   OXYCODONE (OXY IR/ROXICODONE) 5 MG IMMEDIATE RELEASE TABLET    Take 1 tablet (5 mg total) by mouth daily as needed (pain).   SILVER SULFADIAZINE (SILVADENE) 1 % CREAM    Apply 1 application topically daily.   SITAGLIPTIN (JANUVIA) 100 MG TABLET    Take 100 mg by mouth daily.   TAMSULOSIN (FLOMAX) 0.4 MG CAPS CAPSULE    Take 0.4 mg by mouth at bedtime.    TRAVOPROST, BAK FREE, (TRAVATAN) 0.004 % SOLN OPHTHALMIC SOLUTION     Place 1 drop into both eyes at bedtime.   TRAZODONE (DESYREL) 50 MG TABLET    Take 1 tablet (50 mg total) by mouth at bedtime as needed for sleep.  Modified Medications   No medications on file  Discontinued Medications   No medications on file    Review of Systems  Unable to perform ROS: Dementia    Filed Vitals:   12/06/14 1540  BP: 143/79  Pulse: 86  Temp: 97.8 F (36.6 C)  Weight: 135 lb (61.236 kg)  SpO2: 93%   Body mass index is 24.69 kg/(m^2).  Physical Exam  Constitutional: He appears well-developed. No distress.  Sitting up in bed in NAD.  HENT:  Mouth/Throat: Oropharynx is clear and moist.  Eyes: Pupils are equal, round, and reactive to light. No scleral icterus.  Neck: Neck supple. Carotid bruit is not present. No thyromegaly present.  Cardiovascular: Normal rate and intact distal pulses.  An irregularly irregular rhythm present. Exam reveals no gallop and no friction rub.   Murmur heard.  Systolic murmur is present with a grade of 1/6  no distal LE swelling. No calf TTP  Pulmonary/Chest: Effort normal and breath sounds normal. He has no wheezes. He has no rales. He exhibits no tenderness.  Abdominal: Soft. Bowel sounds are normal. He exhibits no distension, no abdominal bruit, no pulsatile midline mass and no mass. There is no tenderness. There is no rebound and no guarding.  Genitourinary:  Foley intact and DTG, dark with red tint but no sediment changes and no clots.  Lymphadenopathy:    He has no cervical adenopathy.  Neurological: He is alert. He has normal reflexes.  Skin: Skin is warm and dry. Rash (left 1st toe) noted.  Psychiatric: He has a normal mood and affect. His behavior is normal.     Labs reviewed:  CBC Latest Ref Rng 12/04/2014 12/02/2014 12/01/2014  WBC - 13.1 13.3(H) 16.1(H)  Hemoglobin 13.5 - 17.5 g/dL 8.8(A) 9.0(L) 8.7(L)  Hematocrit 41 - 53 % 29(A) 27.0(L) 25.2(L)  Platelets 150 - 399 K/L 228 181 159    CMP Latest Ref Rng  12/04/2014 12/02/2014 12/01/2014  Glucose 65 - 99 mg/dL 147 160(H) 167(H)  BUN 4 - 21 mg/dL 11 19 17   Creatinine 0.6 - 1.3 mg/dL 0.7 1.06 1.24  Sodium 137 - 147 mmol/L 141 142 140  Potassium 3.4 - 5.3 mmol/L 3.8 4.0 3.7  Chloride 101 - 111 mmol/L 108 111 110  CO2 22 - 32 mmol/L 24 24 21(L)  Calcium 8.9 - 10.3 mg/dL - 8.1(L) 7.9(L)  Total Protein 6.0 - 8.3 g/dL - - -  Total Bilirubin 0.3 - 1.2 mg/dL - - -  Alkaline Phos 39 - 117 U/L - - -  AST 0 - 37 U/L - - -  ALT 0 - 53 U/L - - -     Admission on 11/28/2014, Discharged on 12/02/2014  Component Date Value Ref Range Status  . Sodium 11/28/2014 137  135 - 145 mmol/L Final  . Potassium 11/28/2014 4.1  3.5 - 5.1 mmol/L Final  . Chloride 11/28/2014 105  101 - 111 mmol/L Final  . BUN 11/28/2014 24* 6 - 20 mg/dL Final  . Creatinine, Ser 11/28/2014 1.20  0.61 - 1.24 mg/dL Final  . Glucose, Bld 11/28/2014 166* 65 - 99 mg/dL Final  . Calcium, Ion 11/28/2014 1.12* 1.13 - 1.30 mmol/L Final  . TCO2 11/28/2014 20  0 - 100 mmol/L Final  . Hemoglobin 11/28/2014 11.2* 13.0 - 17.0 g/dL Final  . HCT 11/28/2014 33.0* 39.0 - 52.0 % Final  . WBC 11/28/2014 12.5* 4.0 - 10.5 K/uL Final  . RBC 11/28/2014 3.50* 4.22 - 5.81 MIL/uL Final  . Hemoglobin 11/28/2014 11.1* 13.0 - 17.0 g/dL Final  . HCT 11/28/2014 32.2* 39.0 - 52.0 % Final  . MCV 11/28/2014 92.0  78.0 - 100.0 fL Final  . MCH 11/28/2014 31.7  26.0 - 34.0 pg Final  . MCHC 11/28/2014  34.5  30.0 - 36.0 g/dL Final  . RDW 11/28/2014 12.6  11.5 - 15.5 % Final  . Platelets 11/28/2014 227  150 - 400 K/uL Final  . Neutrophils Relative % 11/28/2014 88* 43 - 77 % Final  . Neutro Abs 11/28/2014 10.9* 1.7 - 7.7 K/uL Final  . Lymphocytes Relative 11/28/2014 9* 12 - 46 % Final  . Lymphs Abs 11/28/2014 1.1  0.7 - 4.0 K/uL Final  . Monocytes Relative 11/28/2014 3  3 - 12 % Final  . Monocytes Absolute 11/28/2014 0.4  0.1 - 1.0 K/uL Final  . Eosinophils Relative 11/28/2014 0  0 - 5 % Final  . Eosinophils  Absolute 11/28/2014 0.0  0.0 - 0.7 K/uL Final  . Basophils Relative 11/28/2014 0  0 - 1 % Final  . Basophils Absolute 11/28/2014 0.0  0.0 - 0.1 K/uL Final  . Sodium 11/28/2014 135  135 - 145 mmol/L Final  . Potassium 11/28/2014 4.5  3.5 - 5.1 mmol/L Final  . Chloride 11/28/2014 104  101 - 111 mmol/L Final  . CO2 11/28/2014 22  22 - 32 mmol/L Final  . Glucose, Bld 11/28/2014 169* 65 - 99 mg/dL Final  . BUN 11/28/2014 20  6 - 20 mg/dL Final  . Creatinine, Ser 11/28/2014 1.27* 0.61 - 1.24 mg/dL Final  . Calcium 11/28/2014 8.9  8.9 - 10.3 mg/dL Final  . GFR calc non Af Amer 11/28/2014 50* >60 mL/min Final  . GFR calc Af Amer 11/28/2014 59* >60 mL/min Final   Comment: (NOTE) The eGFR has been calculated using the CKD EPI equation. This calculation has not been validated in all clinical situations. eGFR's persistently <60 mL/min signify possible Chronic Kidney Disease.   . Anion gap 11/28/2014 9  5 - 15 Final  . Prothrombin Time 11/28/2014 27.8* 11.6 - 15.2 seconds Final  . INR 11/28/2014 2.64* 0.00 - 1.49 Final  . Troponin I 11/28/2014 <0.03  <0.031 ng/mL Final   Comment:        NO INDICATION OF MYOCARDIAL INJURY.   . ABO/RH(D) 11/28/2014 O NEG   Final  . Antibody Screen 11/28/2014 NEG   Final  . Sample Expiration 11/28/2014 12/01/2014   Final  . Unit Number 11/28/2014 [id number]   Final  . Blood Component Type 11/28/2014 RED CELLS,LR   Final  . Unit division 11/28/2014 00   Final  . Status of Unit 11/28/2014 ISSUED,FINAL   Final  . Transfusion Status 11/28/2014 OK TO TRANSFUSE   Final  . Crossmatch Result 11/28/2014 Compatible   Final  . Unit Number 11/28/2014 [id number]   Final  . Blood Component Type 11/28/2014 RED CELLS,LR   Final  . Unit division 11/28/2014 00   Final  . Status of Unit 11/28/2014 ISSUED,FINAL   Final  . Transfusion Status 11/28/2014 OK TO TRANSFUSE   Final  . Crossmatch Result 11/28/2014 Compatible   Final  . Fecal Occult Bld 11/28/2014 POSITIVE*  NEGATIVE Final  . Color, Urine 11/28/2014 RED* YELLOW Final   BIOCHEMICALS MAY BE AFFECTED BY COLOR  . APPearance 11/28/2014 TURBID* CLEAR Final  . Specific Gravity, Urine 11/28/2014 1.027  1.005 - 1.030 Final  . pH 11/28/2014 6.5  5.0 - 8.0 Final  . Glucose, UA 11/28/2014 100* NEGATIVE mg/dL Final  . Hgb urine dipstick 11/28/2014 LARGE* NEGATIVE Final  . Bilirubin Urine 11/28/2014 LARGE* NEGATIVE Final  . Ketones, ur 11/28/2014 40* NEGATIVE mg/dL Final  . Protein, ur 11/28/2014 >300* NEGATIVE mg/dL Final  . Urobilinogen, UA 11/28/2014 2.0*  0.0 - 1.0 mg/dL Final  . Nitrite 11/28/2014 POSITIVE* NEGATIVE Final  . Leukocytes, UA 11/28/2014 LARGE* NEGATIVE Final  . Specimen Description 11/28/2014 URINE, CLEAN CATCH   Final  . Special Requests 11/28/2014 NONE   Final  . Culture 11/28/2014 3,000 COLONIES/mL INSIGNIFICANT GROWTH   Final  . Report Status 11/28/2014 11/29/2014 FINAL   Final  . WBC, UA 11/28/2014 21-50  <3 WBC/hpf Final  . RBC / HPF 11/28/2014 TOO NUMEROUS TO COUNT  <3 RBC/hpf Final  . Bacteria, UA 11/28/2014 MANY* RARE Final  . ABO/RH(D) 11/28/2014 O NEG   Final  . Sodium 11/29/2014 140  135 - 145 mmol/L Final  . Potassium 11/29/2014 4.8  3.5 - 5.1 mmol/L Final  . Chloride 11/29/2014 110  101 - 111 mmol/L Final  . CO2 11/29/2014 22  22 - 32 mmol/L Final  . Glucose, Bld 11/29/2014 157* 65 - 99 mg/dL Final  . BUN 11/29/2014 27* 6 - 20 mg/dL Final  . Creatinine, Ser 11/29/2014 1.68* 0.61 - 1.24 mg/dL Final  . Calcium 11/29/2014 8.4* 8.9 - 10.3 mg/dL Final  . GFR calc non Af Amer 11/29/2014 36* >60 mL/min Final  . GFR calc Af Amer 11/29/2014 42* >60 mL/min Final   Comment: (NOTE) The eGFR has been calculated using the CKD EPI equation. This calculation has not been validated in all clinical situations. eGFR's persistently <60 mL/min signify possible Chronic Kidney Disease.   . Anion gap 11/29/2014 8  5 - 15 Final  . WBC 11/29/2014 20.9* 4.0 - 10.5 K/uL Final  . RBC  11/29/2014 2.29* 4.22 - 5.81 MIL/uL Final  . Hemoglobin 11/29/2014 7.4* 13.0 - 17.0 g/dL Final   Comment: REPEATED TO VERIFY DELTA CHECK NOTED RESULT CALLED TO, READ BACK BY AND VERIFIED WITH: TINA ISAACS AT 2683 11/29/14. K.PAXTON   . HCT 11/29/2014 21.3* 39.0 - 52.0 % Final  . MCV 11/29/2014 93.0  78.0 - 100.0 fL Final  . MCH 11/29/2014 32.3  26.0 - 34.0 pg Final  . MCHC 11/29/2014 34.7  30.0 - 36.0 g/dL Final  . RDW 11/29/2014 12.8  11.5 - 15.5 % Final  . Platelets 11/29/2014 194  150 - 400 K/uL Final  . Glucose-Capillary 11/28/2014 166* 65 - 99 mg/dL Final  . Glucose-Capillary 11/29/2014 132* 65 - 99 mg/dL Final  . Order Confirmation 11/29/2014 ORDER PROCESSED BY BLOOD BANK   Final  . Glucose-Capillary 11/29/2014 121* 65 - 99 mg/dL Final  . Glucose-Capillary 11/29/2014 148* 65 - 99 mg/dL Final  . Hemoglobin 11/29/2014 9.4* 13.0 - 17.0 g/dL Final   Comment: DELTA CHECK NOTED POST TRANSFUSION SPECIMEN   . HCT 11/29/2014 26.9* 39.0 - 52.0 % Final  . Glucose-Capillary 11/30/2014 183* 65 - 99 mg/dL Final  . Glucose-Capillary 11/30/2014 184* 65 - 99 mg/dL Final  . Sodium 12/01/2014 140  135 - 145 mmol/L Final  . Potassium 12/01/2014 3.7  3.5 - 5.1 mmol/L Final   DELTA CHECK NOTED  . Chloride 12/01/2014 110  101 - 111 mmol/L Final  . CO2 12/01/2014 21* 22 - 32 mmol/L Final  . Glucose, Bld 12/01/2014 167* 65 - 99 mg/dL Final  . BUN 12/01/2014 17  6 - 20 mg/dL Final  . Creatinine, Ser 12/01/2014 1.24  0.61 - 1.24 mg/dL Final  . Calcium 12/01/2014 7.9* 8.9 - 10.3 mg/dL Final  . GFR calc non Af Amer 12/01/2014 52* >60 mL/min Final  . GFR calc Af Amer 12/01/2014 >60  >60 mL/min Final   Comment: (NOTE)  The eGFR has been calculated using the CKD EPI equation. This calculation has not been validated in all clinical situations. eGFR's persistently <60 mL/min signify possible Chronic Kidney Disease.   . Anion gap 12/01/2014 9  5 - 15 Final  . WBC 12/01/2014 16.1* 4.0 - 10.5 K/uL Final    . RBC 12/01/2014 2.77* 4.22 - 5.81 MIL/uL Final  . Hemoglobin 12/01/2014 8.7* 13.0 - 17.0 g/dL Final  . HCT 12/01/2014 25.2* 39.0 - 52.0 % Final  . MCV 12/01/2014 91.0  78.0 - 100.0 fL Final  . MCH 12/01/2014 31.4  26.0 - 34.0 pg Final  . MCHC 12/01/2014 34.5  30.0 - 36.0 g/dL Final  . RDW 12/01/2014 15.1  11.5 - 15.5 % Final  . Platelets 12/01/2014 159  150 - 400 K/uL Final  . Glucose-Capillary 11/30/2014 198* 65 - 99 mg/dL Final  . Glucose-Capillary 11/30/2014 86  65 - 99 mg/dL Final  . Glucose-Capillary 12/01/2014 173* 65 - 99 mg/dL Final  . Glucose-Capillary 12/01/2014 183* 65 - 99 mg/dL Final  . WBC 12/02/2014 13.3* 4.0 - 10.5 K/uL Final  . RBC 12/02/2014 2.95* 4.22 - 5.81 MIL/uL Final  . Hemoglobin 12/02/2014 9.0* 13.0 - 17.0 g/dL Final  . HCT 12/02/2014 27.0* 39.0 - 52.0 % Final  . MCV 12/02/2014 91.5  78.0 - 100.0 fL Final  . MCH 12/02/2014 30.5  26.0 - 34.0 pg Final  . MCHC 12/02/2014 33.3  30.0 - 36.0 g/dL Final  . RDW 12/02/2014 14.7  11.5 - 15.5 % Final  . Platelets 12/02/2014 181  150 - 400 K/uL Final  . Sodium 12/02/2014 142  135 - 145 mmol/L Final  . Potassium 12/02/2014 4.0  3.5 - 5.1 mmol/L Final  . Chloride 12/02/2014 111  101 - 111 mmol/L Final  . CO2 12/02/2014 24  22 - 32 mmol/L Final  . Glucose, Bld 12/02/2014 160* 65 - 99 mg/dL Final  . BUN 12/02/2014 19  6 - 20 mg/dL Final  . Creatinine, Ser 12/02/2014 1.06  0.61 - 1.24 mg/dL Final  . Calcium 12/02/2014 8.1* 8.9 - 10.3 mg/dL Final  . GFR calc non Af Amer 12/02/2014 >60  >60 mL/min Final  . GFR calc Af Amer 12/02/2014 >60  >60 mL/min Final   Comment: (NOTE) The eGFR has been calculated using the CKD EPI equation. This calculation has not been validated in all clinical situations. eGFR's persistently <60 mL/min signify possible Chronic Kidney Disease.   . Anion gap 12/02/2014 7  5 - 15 Final  . Glucose-Capillary 12/01/2014 206* 65 - 99 mg/dL Final  . Glucose-Capillary 12/01/2014 179* 65 - 99 mg/dL Final   . Glucose-Capillary 12/02/2014 152* 65 - 99 mg/dL Final  . Glucose-Capillary 12/02/2014 152* 65 - 99 mg/dL Final  . Glucose-Capillary 12/02/2014 214* 65 - 99 mg/dL Final  Admission on 09/14/2014, Discharged on 09/14/2014  Component Date Value Ref Range Status  . Glucose-Capillary 09/14/2014 164* 65 - 99 mg/dL Final  . Comment 1 09/14/2014 Notify RN   Final  Office Visit on 09/07/2014  Component Date Value Ref Range Status  . Sodium 09/07/2014 140  135 - 145 mEq/L Final  . Potassium 09/07/2014 4.2  3.5 - 5.3 mEq/L Final  . Chloride 09/07/2014 105  96 - 112 mEq/L Final  . CO2 09/07/2014 22  19 - 32 mEq/L Final  . Glucose, Bld 09/07/2014 147* 70 - 99 mg/dL Final  . BUN 09/07/2014 19  6 - 23 mg/dL Final  . Creat 09/07/2014 1.08  0.50 - 1.35 mg/dL Final  . Calcium 09/07/2014 9.0  8.4 - 10.5 mg/dL Final  . WBC 09/07/2014 10.4  4.0 - 10.5 K/uL Final  . RBC 09/07/2014 3.92* 4.22 - 5.81 MIL/uL Final  . Hemoglobin 09/07/2014 12.0* 13.0 - 17.0 g/dL Final  . HCT 09/07/2014 36.4* 39.0 - 52.0 % Final  . MCV 09/07/2014 92.9  78.0 - 100.0 fL Final  . MCH 09/07/2014 30.6  26.0 - 34.0 pg Final  . MCHC 09/07/2014 33.0  30.0 - 36.0 g/dL Final  . RDW 09/07/2014 14.5  11.5 - 15.5 % Final  . Platelets 09/07/2014 242  150 - 400 K/uL Final  . MPV 09/07/2014 11.2  8.6 - 12.4 fL Final  . Neutrophils Relative % 09/07/2014 77  43 - 77 % Final  . Neutro Abs 09/07/2014 8.0* 1.7 - 7.7 K/uL Final  . Lymphocytes Relative 09/07/2014 15  12 - 46 % Final  . Lymphs Abs 09/07/2014 1.6  0.7 - 4.0 K/uL Final  . Monocytes Relative 09/07/2014 7  3 - 12 % Final  . Monocytes Absolute 09/07/2014 0.7  0.1 - 1.0 K/uL Final  . Eosinophils Relative 09/07/2014 1  0 - 5 % Final  . Eosinophils Absolute 09/07/2014 0.1  0.0 - 0.7 K/uL Final  . Basophils Relative 09/07/2014 0  0 - 1 % Final  . Basophils Absolute 09/07/2014 0.0  0.0 - 0.1 K/uL Final  . Smear Review 09/07/2014 Criteria for review not met   Final  . CRP 09/07/2014  0.7* <0.60 mg/dL Final  . Sed Rate 09/07/2014 28* 0 - 20 mm/hr Final    Ct Renal Stone Study  11/28/2014   CLINICAL DATA:  Hematuria.  Initial encounter.  Prostate cancer.  EXAM: CT ABDOMEN AND PELVIS WITHOUT CONTRAST  TECHNIQUE: Multidetector CT imaging of the abdomen and pelvis was performed following the standard protocol without IV contrast.  COMPARISON:  CT 10/02/2013.  FINDINGS: Unenhanced CT was performed per clinician order. Lack of IV contrast limits sensitivity and specificity, especially for evaluation of abdominal/pelvic solid viscera.  Musculoskeletal: No aggressive osseous lesions. Grade I anterolisthesis of L4 on L5. Lower lumbar facet arthrosis. No sclerotic lesions to suggest bony metastatic disease in this patient with history of prostate cancer. Posttraumatic changes with removed hardware from the proximal LEFT femur.  Lung Bases: Linear scarring/ atelectasis at the lung bases. Calcified granuloma in the anterior LEFT lower lobe.  Liver:  Grossly within normal limits.  Spleen:  Normal.  Gallbladder:  Contracted.  No calcified stones.  Common bile duct:  Normal.  Pancreas:  Normal.  Adrenal glands: Bilateral adrenal thickening compatible with hyperplasia.  Kidneys: Mild ureteral ectasia and minimal dilation of the collecting systems. No calyceal dilation. No renal calculi. Bilateral ureteral ectasia.  Stomach:  Grossly normal.  Small bowel:  Grossly normal.  Colon: Normal appendix. Colonic diverticulosis without diverticulitis.  Pelvic Genitourinary: Foley catheter in the urinary bladder. Large amount of clot is present in the urinary bladder with heterogeneous high attenuation on this noncontrast CT. Iatrogenic air is also present in the urinary bladder. There is mural thickening for the degree of distension which is probably secondary to chronic bladder outflow obstruction.  Prostatomegaly is present. Prostate calcifications demarcate the bladder base from the enlarged prostate gland which  produces an impression on the inferior bladder. Prostate brachytherapy seeds are present.  Peritoneum: No intra-abdominal free air.  Vascular/lymphatic: Atherosclerosis. No gross acute vascular abnormality allowing for noncontrast technique.  Body Wall: Patient is cachectic.  IMPRESSION: 1. Large amount of clot in the urinary bladder producing distension. 2. Foley catheter position in the urinary bladder with iatrogenic air. 3. Prostatomegaly with prostate brachytherapy seeds. 4. Very mild hydroureteronephrosis, probably secondary to clot in the bladder.   Electronically Signed   By: Dereck Ligas M.D.   On: 11/28/2014 14:55     Assessment/Plan   ICD-9-CM ICD-10-CM   1. Hemorrhagic cystitis on IV abx 595.9 N30.91   2. Protein-calorie malnutrition, severe 262 E43   3. Dementia, without behavioral disturbance - stable 294.20 F03.90   4. BPH (benign prostatic hyperplasia) - stable 600.00 N40.0   5.       Diabetes Mellitus type 2, controlled with LE ulcer - stable 707.15 L97.519   6. Prostate cancer s/p brachytx - stable 185 C61    --weekly CBC w diff and BMP  --wound care as directed  --cont current meds as ordered  --cont nutritional supplement  --foley cath care as indicated  --f/u with urology Dr Janice Norrie as scheduled   --PT/OT/ST as ordered  --GOAL: short term rehab and d/c home when medically appropriate. Communicated with nursing.  --will follow  Sal Spratley S. Perlie Gold  [facility] [address] [place], [place] [address] [phone] Cell (Monday-Friday 8 AM - 5 PM) [phone] After 5 PM and follow prompts

## 2014-12-28 ENCOUNTER — Non-Acute Institutional Stay (SKILLED_NURSING_FACILITY): Payer: Medicare Other | Admitting: Adult Health

## 2014-12-28 DIAGNOSIS — IMO0002 Reserved for concepts with insufficient information to code with codable children: Secondary | ICD-10-CM

## 2014-12-28 DIAGNOSIS — E785 Hyperlipidemia, unspecified: Secondary | ICD-10-CM | POA: Diagnosis not present

## 2014-12-28 DIAGNOSIS — F0390 Unspecified dementia without behavioral disturbance: Secondary | ICD-10-CM

## 2014-12-28 DIAGNOSIS — L089 Local infection of the skin and subcutaneous tissue, unspecified: Secondary | ICD-10-CM

## 2014-12-28 DIAGNOSIS — I739 Peripheral vascular disease, unspecified: Secondary | ICD-10-CM | POA: Diagnosis not present

## 2014-12-28 DIAGNOSIS — E1165 Type 2 diabetes mellitus with hyperglycemia: Secondary | ICD-10-CM

## 2014-12-28 DIAGNOSIS — E1159 Type 2 diabetes mellitus with other circulatory complications: Secondary | ICD-10-CM | POA: Diagnosis not present

## 2014-12-28 DIAGNOSIS — R1314 Dysphagia, pharyngoesophageal phase: Secondary | ICD-10-CM

## 2014-12-28 DIAGNOSIS — I10 Essential (primary) hypertension: Secondary | ICD-10-CM

## 2014-12-28 DIAGNOSIS — E1151 Type 2 diabetes mellitus with diabetic peripheral angiopathy without gangrene: Secondary | ICD-10-CM

## 2015-01-06 ENCOUNTER — Non-Acute Institutional Stay (SKILLED_NURSING_FACILITY): Payer: Medicare Other | Admitting: Adult Health

## 2015-01-06 ENCOUNTER — Encounter: Payer: Self-pay | Admitting: Adult Health

## 2015-01-06 DIAGNOSIS — I10 Essential (primary) hypertension: Secondary | ICD-10-CM | POA: Diagnosis not present

## 2015-01-06 DIAGNOSIS — IMO0002 Reserved for concepts with insufficient information to code with codable children: Secondary | ICD-10-CM | POA: Insufficient documentation

## 2015-01-06 DIAGNOSIS — R1314 Dysphagia, pharyngoesophageal phase: Secondary | ICD-10-CM

## 2015-01-06 DIAGNOSIS — C61 Malignant neoplasm of prostate: Secondary | ICD-10-CM | POA: Diagnosis not present

## 2015-01-06 DIAGNOSIS — E1165 Type 2 diabetes mellitus with hyperglycemia: Secondary | ICD-10-CM

## 2015-01-06 DIAGNOSIS — E1151 Type 2 diabetes mellitus with diabetic peripheral angiopathy without gangrene: Secondary | ICD-10-CM | POA: Insufficient documentation

## 2015-01-06 DIAGNOSIS — E1159 Type 2 diabetes mellitus with other circulatory complications: Secondary | ICD-10-CM | POA: Diagnosis not present

## 2015-01-06 DIAGNOSIS — E785 Hyperlipidemia, unspecified: Secondary | ICD-10-CM | POA: Insufficient documentation

## 2015-01-06 DIAGNOSIS — I739 Peripheral vascular disease, unspecified: Secondary | ICD-10-CM

## 2015-01-06 MED ORDER — CANAGLIFLOZIN 100 MG PO TABS: 100.0000 mg | ORAL_TABLET | Freq: Every day | ORAL | Status: AC

## 2015-01-06 NOTE — Progress Notes (Signed)
Patient ID: Craig Henderson, male   DOB: 16-Jan-1930, 81 y.o.   MRN: [id number]    Facility: [name]      No Known Allergies  Chief Complaint  Patient presents with  . Discharge Note    HPI:  He is being discharged to home with home health for pt/ot/aid. He will need a standard wheelchair; will need a 3:1 commode. He will need his prescriptions to be written and will need to follow up with his pcp.  He had been hospitalized for hemorrhagic cystitis. He has a history of colon cancer.    Past Medical History  Diagnosis Date  . Prostate cancer 07/18/11    bx=adenocarcinoma=PSA=12.45,gleason=4+5=9,4+3=7,4+4=8, 3+3=6 & 3+7,volume=156.35cc  . Diabetes mellitus   . Hypertension   . Hypercholesterolemia   . BPH (benign prostatic hyperplasia)     with nocturia,  . ED (erectile dysfunction)   . Hx of radiation therapy 10/23/11 - 12/18/11    prostate, seminal vesicles, lymph nodes  . Mild dementia     Past Surgical History  Procedure Laterality Date  . Prostate biopsy  07/18/2011    gleason 4+5=9  . Rotator cuff repair  2011  . Fracture surgery  35 years    right leg/rod placement  . Cardioversion N/A 05/11/2013    Procedure: CARDIOVERSION;  Surgeon: Laverda Page, MD;  Location: [place];  Service: Cardiovascular;  Laterality: N/A;  . Peripheral vascular catheterization N/A 09/14/2014    Procedure: Lower Extremity Angiography;  Surgeon: Adrian Prows, MD;  Location: [facility];  Service: Cardiovascular;  Laterality: N/A;  . Femur fracture surgery      has metal rods both legs    VITAL SIGNS BP 130/66 mmHg  Pulse 76  Ht 5\' 9"  (1.753 m)  Wt 138 lb (62.596 kg)  BMI 20.37 kg/m2  SpO2 93%  Patient's Medications  New Prescriptions   No medications on file  Previous Medications   ACETAMINOPHEN (TYLENOL) 325 MG TABLET    Take 2 tablets (650 mg total) by mouth every 6 (six) hours as needed for mild pain (or Fever >/= 101).   ATORVASTATIN (LIPITOR) 20  MG TABLET    Take 20 mg by mouth daily.   CANAGLIFLOZIN (INVOKANA) 100 MG TABS TABLET    Take 1 tablet (100 mg total) by mouth daily.   CLOTRIMAZOLE-BETAMETHASONE (LOTRISONE) CREAM    Apply 1 application topically daily as needed (wound care).    DONEPEZIL (ARICEPT) 10 MG TABLET    TAKE 1 TABLET BY MOUTH EVERY DAY AT BEDTIME   ENSURE (ENSURE)    Take 237 mLs by mouth 3 (three) times daily.    IRON-FA-B CMP-C-BIOT-PROBIOTIC (FUSION PLUS) CAPS    Take 1 capsule by mouth daily.   MALTODEXTRIN-XANTHAN GUM (RESOURCE THICKENUP CLEAR) POWD    Use as directed for nectar thick liquids during meals   METOPROLOL TARTRATE (LOPRESSOR) 25 MG TABLET    Take 1 tablet (25 mg total) by mouth 3 (three) times daily.   NAMENDA XR 28 MG CP24 24 HR CAPSULE    TAKE ONE CAPSULE BY MOUTH EVERY DAY--NEEDS APPT   OXYCODONE (OXY IR/ROXICODONE) 5 MG IMMEDIATE RELEASE TABLET    Take 1 tablet (5 mg total) by mouth daily as needed (pain).   SITAGLIPTIN (JANUVIA) 100 MG TABLET    Take 100 mg by mouth daily.   TAMSULOSIN (FLOMAX) 0.4 MG CAPS CAPSULE    Take 0.4 mg by mouth at bedtime.    TRAVOPROST, BAK FREE, (  TRAVATAN) 0.004 % SOLN OPHTHALMIC SOLUTION    Place 1 drop into both eyes at bedtime.   TRAZODONE (DESYREL) 50 MG TABLET    Take 1 tablet (50 mg total) by mouth at bedtime as needed for sleep.  Modified Medications   No medications on file  Discontinued Medications     SIGNIFICANT DIAGNOSTIC EXAMS   LABS REVIEWED:   12-04-14; wbc 13.1; hgb 8.8; hct 29.3; mcv 96.0; plt 228; glucose 147; bun 10.6; creat 0.74; k+ 3.8; na++141    Review of Systems Unable to perform ROS: Dementia    Physical Exam Constitutional: No distress.  Frail   Eyes: Conjunctivae are normal.  Neck: Neck supple. No JVD present. No thyromegaly present.  Cardiovascular: Normal rate, regular rhythm and intact distal pulses.   Respiratory: Effort normal and breath sounds normal. No respiratory distress. He has no wheezes.  GI: Soft. Bowel  sounds are normal. He exhibits no distension. There is no tenderness.  Genitourinary:  Foley   Musculoskeletal: He exhibits no edema.  Able to move all extremities   Lymphadenopathy:    He has no cervical adenopathy.  Neurological: He is alert.  Skin: Skin is warm and dry. He is not diaphoretic.  Psychiatric: He has a normal mood and affect.      ASSESSMENT/ PLAN:  Will discharge to home with home health for pt/otaid to evaluate and treat as indicated for gait; balance; strength; adl retraining and adl care. He will need a standard wheelchair in order for him to maintain his current level of independence with his adl's which cannot be achieved with a walker. He can self propel wheelchair.  Will need 3:1 commode  His prescriptions have been written for a 30 day supply of his medications with oxycodone 5 mg # 30 tabs.  Facility is to setup follow up appointment with pcp    Time spent with patient  45   minutes >50% time spent counseling; reviewing medical record; tests; labs; and developing future plan of care    Ok Edwards NP [facility]  Contact [phone] Monday through Friday 8am- 5pm  After hours call [phone]

## 2015-01-06 NOTE — Progress Notes (Signed)
Patient ID: Craig Henderson, male   DOB: 1929/08/06, 81 y.o.   MRN: [id number]    Facility: [name]      No Known Allergies  Chief Complaint  Patient presents with  . Medical Management of Chronic Issues  . Acute Visit    left great toe ingrown     HPI:  He is a resident of this facility being seen for the management of his chronic illnesses. He has an infected left great toenail he does have pain present ot his toe. He tells me that he is not feeling good.  He has a foley with clear urine.     Past Medical History  Diagnosis Date  . Prostate cancer 07/18/11    bx=adenocarcinoma=PSA=12.45,gleason=4+5=9,4+3=7,4+4=8, 3+3=6 & 3+7,volume=156.35cc  . Diabetes mellitus   . Hypertension   . Hypercholesterolemia   . BPH (benign prostatic hyperplasia)     with nocturia,  . ED (erectile dysfunction)   . Hx of radiation therapy 10/23/11 - 12/18/11    prostate, seminal vesicles, lymph nodes  . Mild dementia     Past Surgical History  Procedure Laterality Date  . Prostate biopsy  07/18/2011    gleason 4+5=9  . Rotator cuff repair  2011  . Fracture surgery  35 years    right leg/rod placement  . Cardioversion N/A 05/11/2013    Procedure: CARDIOVERSION;  Surgeon: Laverda Page, MD;  Location: [place];  Service: Cardiovascular;  Laterality: N/A;  . Peripheral vascular catheterization N/A 09/14/2014    Procedure: Lower Extremity Angiography;  Surgeon: Adrian Prows, MD;  Location: [place] [facility];  Service: Cardiovascular;  Laterality: N/A;  . Femur fracture surgery      has metal rods both legs    VITAL SIGNS BP 124/68 mmHg  Pulse 74  Ht 5\' 9"  (1.753 m)  Wt 137 lb (62.143 kg)  BMI 20.22 kg/m2  SpO2 93%  Patient's Medications  New Prescriptions   No medications on file  Previous Medications   ACETAMINOPHEN (TYLENOL) 325 MG TABLET    Take 2 tablets (650 mg total) by mouth every 6 (six) hours as needed for mild pain (or Fever >/= 101).   ATORVASTATIN  (LIPITOR) 20 MG TABLET    Take 20 mg by mouth daily.   CLOTRIMAZOLE-BETAMETHASONE (LOTRISONE) CREAM    Apply 1 application topically daily as needed (wound care).    DONEPEZIL (ARICEPT) 10 MG TABLET    TAKE 1 TABLET BY MOUTH EVERY DAY AT BEDTIME   ENSURE (ENSURE)    Take 237 mLs by mouth 3 (three) times daily.    IRON-FA-B CMP-C-BIOT-PROBIOTIC (FUSION PLUS) CAPS    Take 1 capsule by mouth daily.   MALTODEXTRIN-XANTHAN GUM (RESOURCE THICKENUP CLEAR) POWD    Use as directed for nectar thick liquids during meals   METOPROLOL TARTRATE (LOPRESSOR) 25 MG TABLET    Take 1 tablet (25 mg total) by mouth 3 (three) times daily.   NAMENDA XR 28 MG CP24 24 HR CAPSULE    TAKE ONE CAPSULE BY MOUTH EVERY DAY--NEEDS APPT   OXYCODONE (OXY IR/ROXICODONE) 5 MG IMMEDIATE RELEASE TABLET    Take 1 tablet (5 mg total) by mouth daily as needed (pain).   SITAGLIPTIN (JANUVIA) 100 MG TABLET    Take 100 mg by mouth daily.   TAMSULOSIN (FLOMAX) 0.4 MG CAPS CAPSULE    Take 0.4 mg by mouth at bedtime.    TRAVOPROST, BAK FREE, (TRAVATAN) 0.004 % SOLN OPHTHALMIC SOLUTION    Place  1 drop into both eyes at bedtime.   TRAZODONE (DESYREL) 50 MG TABLET    Take 1 tablet (50 mg total) by mouth at bedtime as needed for sleep.  Modified Medications   No medications on file  Discontinued Medications     SIGNIFICANT DIAGNOSTIC EXAMS   LABS REVIEWED:   12-04-14; wbc 13.1; hgb 8.8; hct 29.3; mcv 96.0; plt 228; glucose 147; bun 10.6; creat 0.74; k+ 3.8; na++141   Review of Systems  Unable to perform ROS: Dementia    Physical Exam  Constitutional: No distress.  Frail   Eyes: Conjunctivae are normal.  Neck: Neck supple. No JVD present. No thyromegaly present.  Cardiovascular: Normal rate, regular rhythm and intact distal pulses.   Respiratory: Effort normal and breath sounds normal. No respiratory distress. He has no wheezes.  GI: Soft. Bowel sounds are normal. He exhibits no distension. There is no tenderness.  Genitourinary:   Foley   Musculoskeletal: He exhibits no edema.  Able to move all extremities   Lymphadenopathy:    He has no cervical adenopathy.  Neurological: He is alert.  Skin: Skin is warm and dry. He is not diaphoretic.  Psychiatric: He has a normal mood and affect.     ASSESSMENT/ PLAN:  1. Ingrown left great toe: will begin doxycycline 100 mg twice daily for 2 weeks with florastor twice daily for 2 weeks and will monitor  2. Dysphagia: no signs of aspiration present; will continue nectar thick liquids will monitor   3.  Dyslipidemia: will continue lipitor 20 mg daily   4. Dementia: no significant change in his status; will continue aricept 10 mg nightly and namenda xr  28 mg daily   5. Hypertension: will continue lopressor 25 mg three times daily   6. BPH: has foley without signs of bleeding present; will continue flomax 0.4 mg dialy   7. Diabetes: will continue januvia 100 mg daily will check cbg twice daily will begin invokana 100 mg daily will monitor   8. Insomnia: will continue trazodone 50 mg nightly as needed  9. Glaucoma: will continue travatan to both eyes nightly        Ok Edwards NP [facility]  Contact [phone] Monday through Friday 8am- 5pm  After hours call [phone]

## 2015-02-17 ENCOUNTER — Other Ambulatory Visit: Payer: Self-pay | Admitting: Adult Health

## 2015-04-10 DEATH — deceased

## 2015-06-25 IMAGING — CR DG CHEST 1V PORT
1 series · 1 of 1 positions shown · non-contrast
Comparison: 01/25/2013

CLINICAL DATA: Lethargy.  Cough for several days.  Fever.

EXAM:
PORTABLE CHEST - 1 VIEW

[AP]
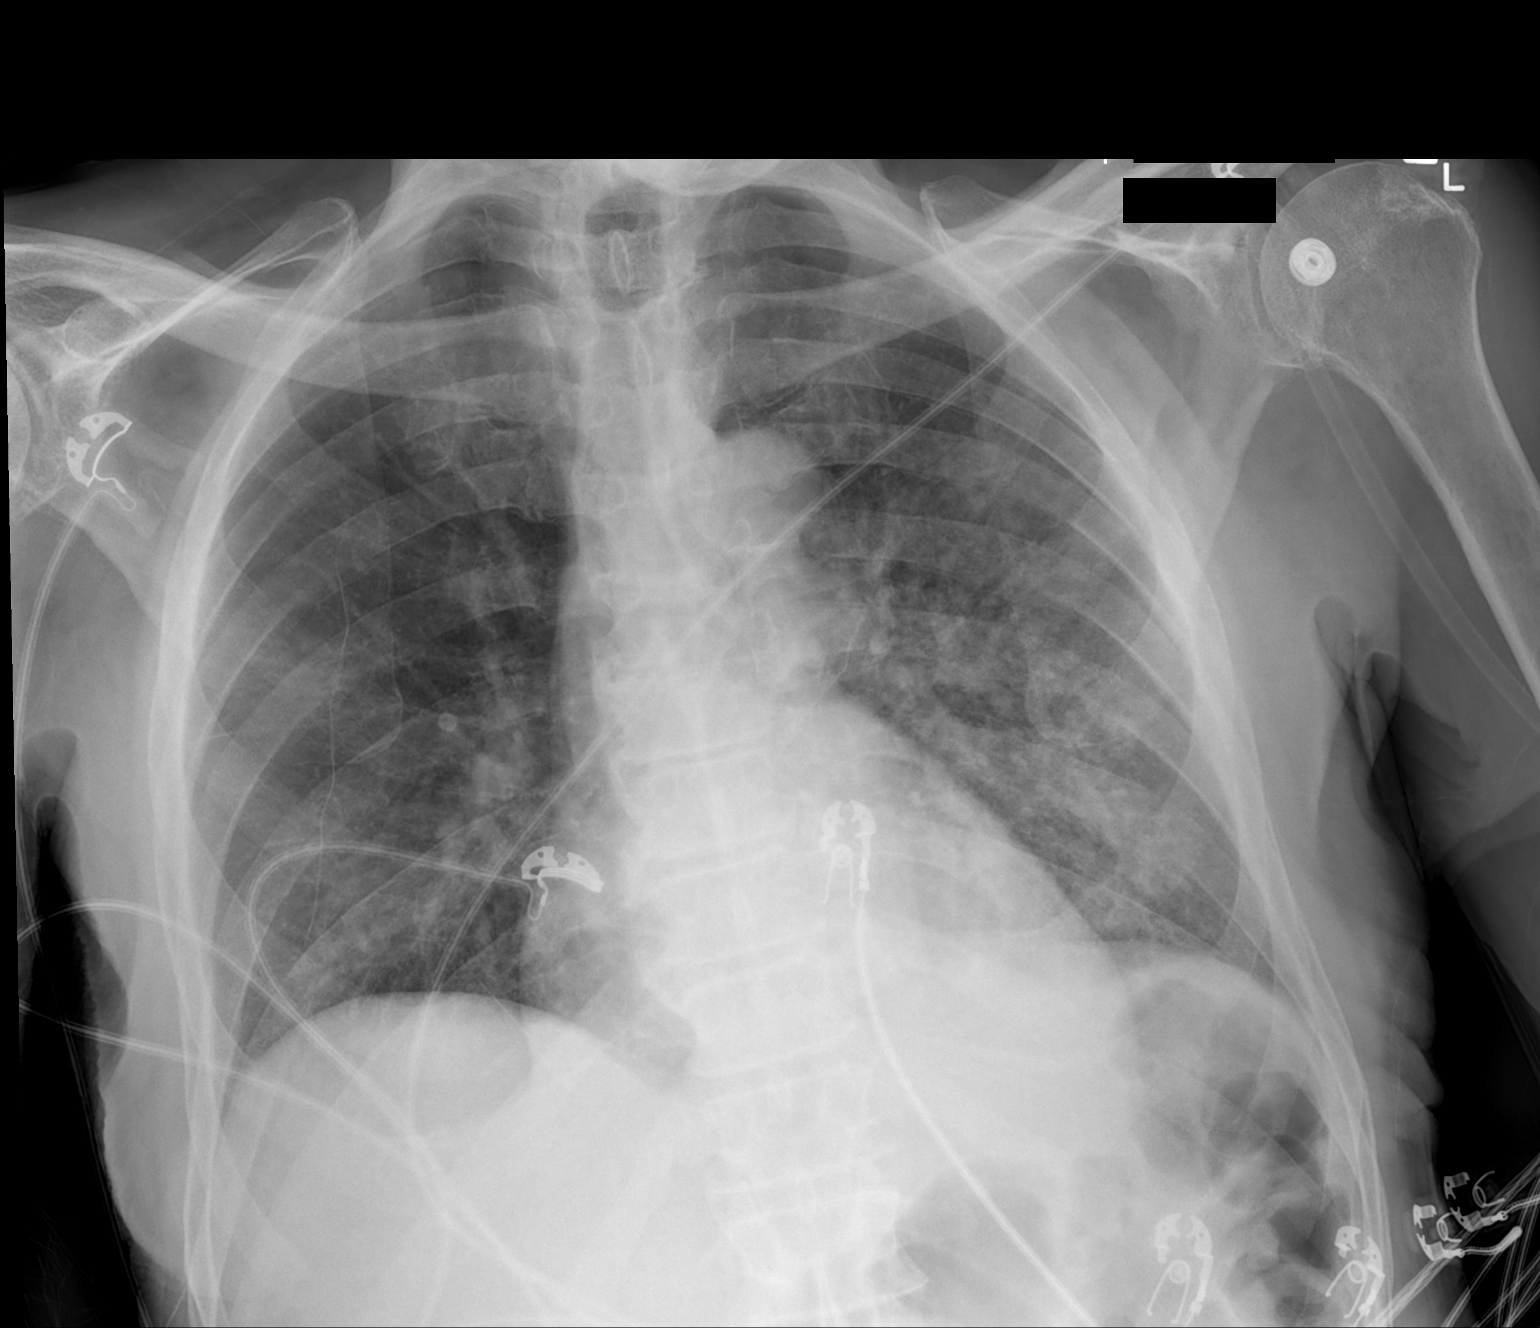

[1 of 1 positions shown; findings below may reference images not displayed]

FINDINGS: Normal heart size. There is no pleural effusion identified. Left
midlung and left base airspace opacities identified, worrisome for
pneumonia. Right lung is clear. The visualized osseous structures
appear intact.
IMPRESSION: 1. Left midlung and left base opacities worrisome for pneumonia.

## 2015-06-29 IMAGING — CR DG CHEST 1V PORT
1 series · 1 of 1 positions shown · non-contrast
Comparison: June 18, 2014.

CLINICAL DATA: Pneumonia.

EXAM:
PORTABLE CHEST - 1 VIEW

[portable]
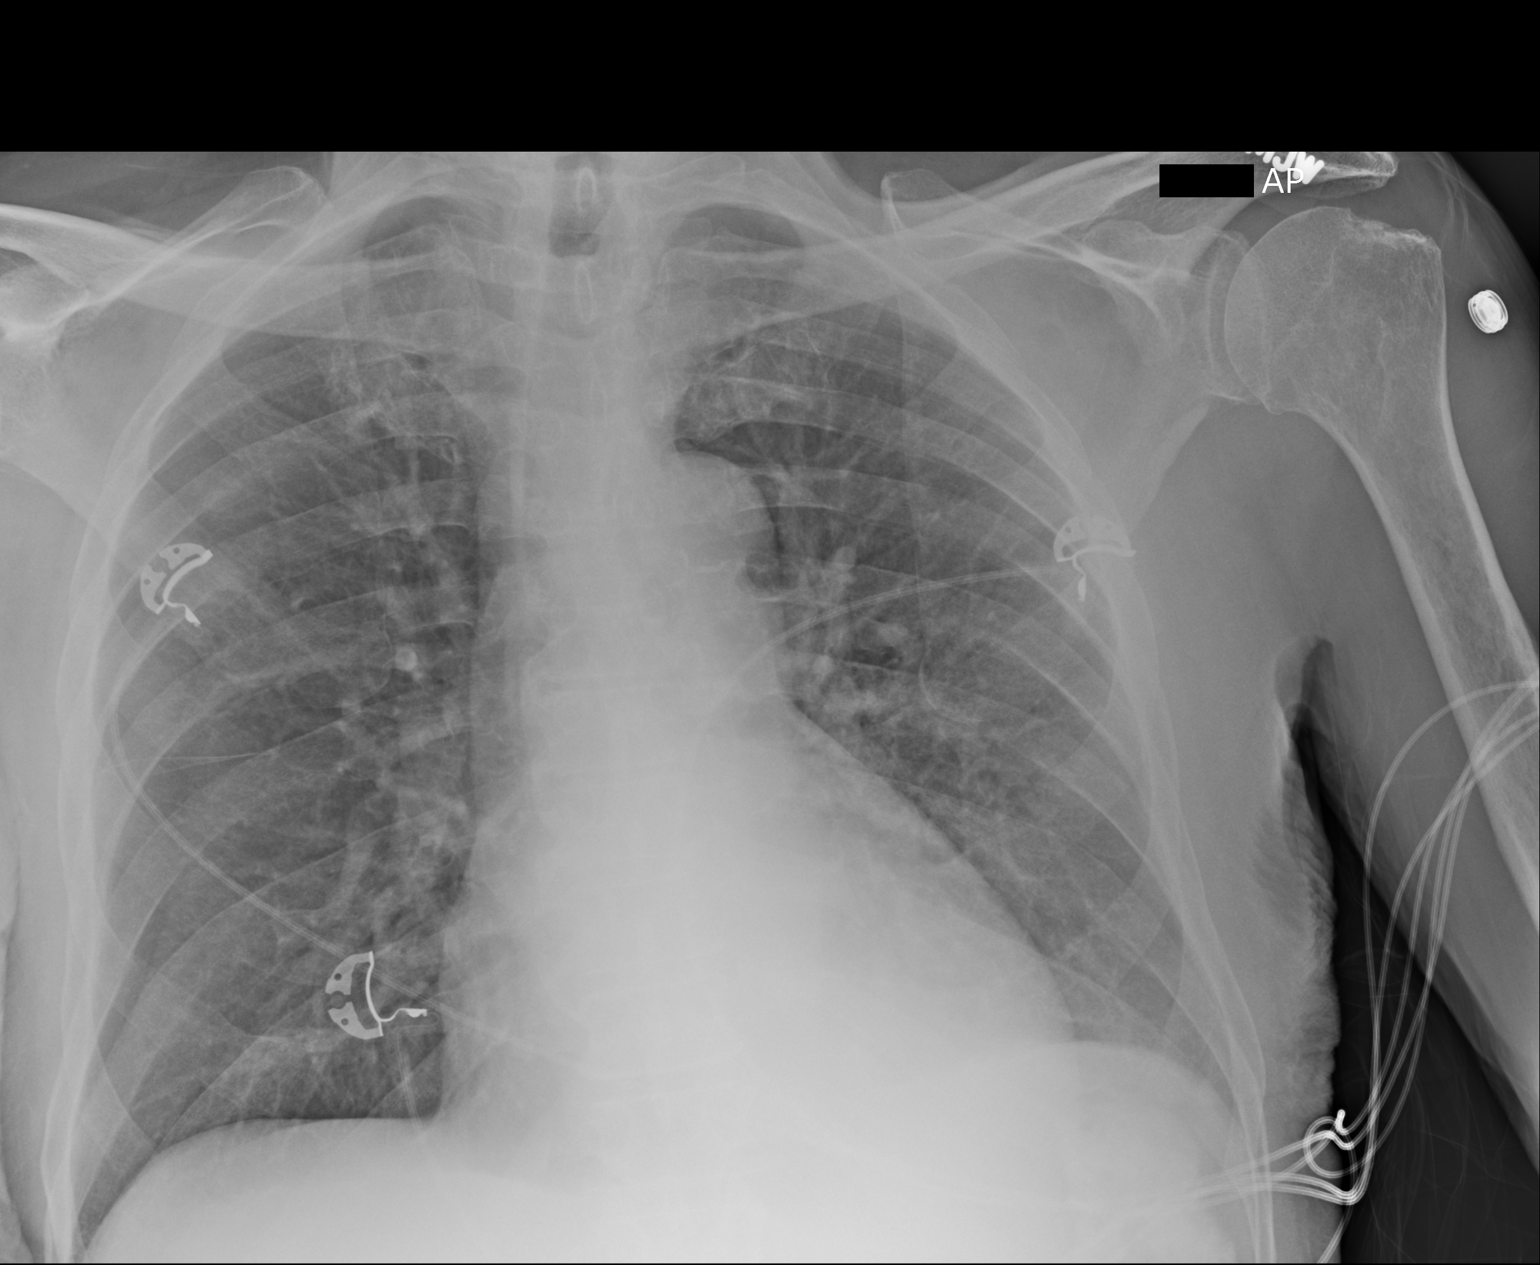

[1 of 1 positions shown; findings below may reference images not displayed]

FINDINGS: The heart size and mediastinal contours are within normal limits. No
pneumothorax or pleural effusion is noted. No acute pulmonary
disease is noted. Left lung opacities noted on prior exam have
resolved. The visualized skeletal structures are unremarkable.
IMPRESSION: No acute cardiopulmonary abnormality seen.

## 2015-07-28 IMAGING — RF DG SWALLOWING FUNCTION
3 series · 3 of 3 positions shown · non-contrast
Comparison: None.

CLINICAL DATA: Dysphagia.  Subsequent encounter.

EXAM:
MODIFIED BARIUM SWALLOW
TECHNIQUE: Different consistencies of barium were administered orally to the
patient by the Speech Pathologist. Imaging of the pharynx was
performed in the lateral projection.
FLUOROSCOPY TIME:  Radiation Exposure Index (as provided by the
fluoroscopic device):
If the device does not provide the exposure index:
Fluoroscopy Time:  2 minutes 41 seconds.
Number of Acquired Images:

[Series 1: run · 1 of 1 slices shown (1 of 3)]
[im 1/1]
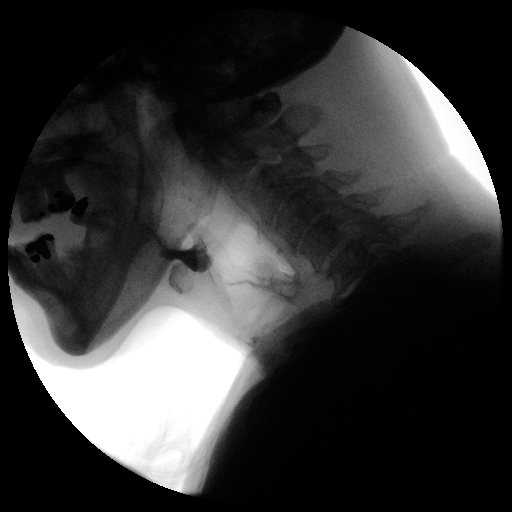

[Series 1: run · 1 of 1 slices shown (2 of 3)]
[im 1/1]
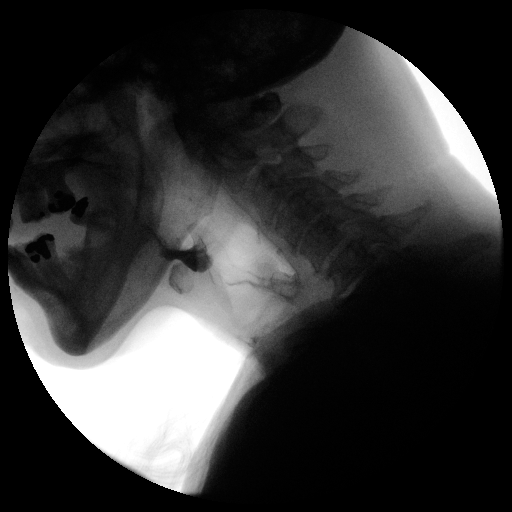

[Series 1: run · 1 of 1 slices shown (3 of 3)]
[im 1/1]
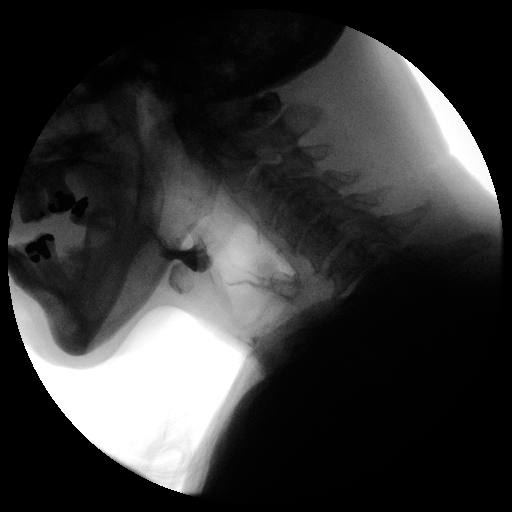

[3 of 3 positions shown; findings below may reference images not displayed]

FINDINGS: Patient was given multiple consistencies of barium. Retention/stasis
was noted on multiple swallows. There was some periodic episodes of
flash penetration.
IMPRESSION: No evidence for frank aspiration.

Please refer to the Speech Pathologists report for complete details
and recommendations.
